# Patient Record
Sex: Female | Born: 1975
Health system: Southern US, Community
[De-identification: ages and names within clinical notes are randomized; demographics above are authoritative.]

## PROBLEM LIST (undated history)

## (undated) DIAGNOSIS — R011 Cardiac murmur, unspecified: Secondary | ICD-10-CM

## (undated) DIAGNOSIS — H269 Unspecified cataract: Secondary | ICD-10-CM

## (undated) DIAGNOSIS — J45909 Unspecified asthma, uncomplicated: Secondary | ICD-10-CM

## (undated) DIAGNOSIS — F419 Anxiety disorder, unspecified: Secondary | ICD-10-CM

## (undated) DIAGNOSIS — K635 Polyp of colon: Secondary | ICD-10-CM

## (undated) DIAGNOSIS — C50919 Malignant neoplasm of unspecified site of unspecified female breast: Secondary | ICD-10-CM

## (undated) DIAGNOSIS — G709 Myoneural disorder, unspecified: Secondary | ICD-10-CM

## (undated) DIAGNOSIS — Z5189 Encounter for other specified aftercare: Secondary | ICD-10-CM

## (undated) DIAGNOSIS — K259 Gastric ulcer, unspecified as acute or chronic, without hemorrhage or perforation: Secondary | ICD-10-CM

## (undated) DIAGNOSIS — J189 Pneumonia, unspecified organism: Secondary | ICD-10-CM

## (undated) DIAGNOSIS — F32A Depression, unspecified: Secondary | ICD-10-CM

## (undated) DIAGNOSIS — D649 Anemia, unspecified: Secondary | ICD-10-CM

## (undated) DIAGNOSIS — Z923 Personal history of irradiation: Secondary | ICD-10-CM

## (undated) DIAGNOSIS — B019 Varicella without complication: Secondary | ICD-10-CM

## (undated) DIAGNOSIS — T7840XA Allergy, unspecified, initial encounter: Secondary | ICD-10-CM

## (undated) DIAGNOSIS — I10 Essential (primary) hypertension: Secondary | ICD-10-CM

## (undated) HISTORY — DX: Allergy, unspecified, initial encounter: T78.40XA

## (undated) HISTORY — DX: Unspecified cataract: H26.9

## (undated) HISTORY — PX: COLONOSCOPY: SHX174

## (undated) HISTORY — DX: Polyp of colon: K63.5

## (undated) HISTORY — PX: BREAST LUMPECTOMY: SHX2

## (undated) HISTORY — DX: Depression, unspecified: F32.A

## (undated) HISTORY — DX: Varicella without complication: B01.9

## (undated) HISTORY — DX: Essential (primary) hypertension: I10

## (undated) HISTORY — DX: Anxiety disorder, unspecified: F41.9

## (undated) HISTORY — DX: Gastric ulcer, unspecified as acute or chronic, without hemorrhage or perforation: K25.9

## (undated) HISTORY — DX: Encounter for other specified aftercare: Z51.89

## (undated) HISTORY — DX: Myoneural disorder, unspecified: G70.9

## (undated) HISTORY — DX: Unspecified asthma, uncomplicated: J45.909

## (undated) HISTORY — PX: WISDOM TOOTH EXTRACTION: SHX21

## (undated) HISTORY — DX: Cardiac murmur, unspecified: R01.1

## (undated) HISTORY — DX: Malignant neoplasm of unspecified site of unspecified female breast: C50.919

## (undated) HISTORY — PX: TUBAL LIGATION: SHX77

## (undated) HISTORY — PX: CHOLECYSTECTOMY: SHX55

---

## 1996-03-18 HISTORY — PX: CHOLECYSTECTOMY, LAPAROSCOPIC: SHX56

## 2004-12-25 ENCOUNTER — Other Ambulatory Visit: Admission: RE | Admit: 2004-12-25 | Discharge: 2004-12-25 | Payer: Self-pay | Admitting: Family Medicine

## 2005-02-13 ENCOUNTER — Encounter: Admission: RE | Admit: 2005-02-13 | Discharge: 2005-02-13 | Payer: Self-pay | Admitting: Family Medicine

## 2006-03-19 ENCOUNTER — Ambulatory Visit: Payer: Self-pay | Admitting: Family Medicine

## 2006-05-13 ENCOUNTER — Ambulatory Visit: Payer: Self-pay | Admitting: Family Medicine

## 2006-07-23 ENCOUNTER — Ambulatory Visit: Payer: Self-pay | Admitting: Family Medicine

## 2006-10-21 ENCOUNTER — Ambulatory Visit: Payer: Self-pay | Admitting: Family Medicine

## 2007-03-14 ENCOUNTER — Emergency Department: Payer: Self-pay | Admitting: Emergency Medicine

## 2007-04-08 ENCOUNTER — Other Ambulatory Visit: Admission: RE | Admit: 2007-04-08 | Discharge: 2007-04-08 | Payer: Self-pay | Admitting: Family Medicine

## 2007-04-08 ENCOUNTER — Ambulatory Visit: Payer: Self-pay | Admitting: Family Medicine

## 2007-06-24 ENCOUNTER — Ambulatory Visit: Payer: Self-pay | Admitting: Surgery

## 2012-03-10 ENCOUNTER — Other Ambulatory Visit: Payer: Self-pay | Admitting: Family Medicine

## 2012-03-10 DIAGNOSIS — R7989 Other specified abnormal findings of blood chemistry: Secondary | ICD-10-CM

## 2012-03-15 ENCOUNTER — Other Ambulatory Visit: Payer: Self-pay | Admitting: Family Medicine

## 2012-03-15 ENCOUNTER — Other Ambulatory Visit: Payer: Self-pay

## 2012-03-15 ENCOUNTER — Ambulatory Visit
Admission: RE | Admit: 2012-03-15 | Discharge: 2012-03-15 | Disposition: A | Discharge status: Home / Self Care | Payer: Medicaid Other | Source: Ambulatory Visit | Attending: Family Medicine | Admitting: Family Medicine

## 2012-03-15 DIAGNOSIS — R7989 Other specified abnormal findings of blood chemistry: Secondary | ICD-10-CM

## 2017-11-05 DIAGNOSIS — Z6833 Body mass index (BMI) 33.0-33.9, adult: Secondary | ICD-10-CM | POA: Diagnosis not present

## 2017-11-05 DIAGNOSIS — J069 Acute upper respiratory infection, unspecified: Secondary | ICD-10-CM | POA: Diagnosis not present

## 2017-11-05 DIAGNOSIS — J019 Acute sinusitis, unspecified: Secondary | ICD-10-CM | POA: Diagnosis not present

## 2017-11-05 DIAGNOSIS — R030 Elevated blood-pressure reading, without diagnosis of hypertension: Secondary | ICD-10-CM | POA: Diagnosis not present

## 2017-12-28 ENCOUNTER — Other Ambulatory Visit: Payer: Self-pay | Admitting: Family Medicine

## 2017-12-28 DIAGNOSIS — I160 Hypertensive urgency: Secondary | ICD-10-CM | POA: Diagnosis not present

## 2017-12-28 DIAGNOSIS — Z1231 Encounter for screening mammogram for malignant neoplasm of breast: Secondary | ICD-10-CM | POA: Diagnosis not present

## 2017-12-28 DIAGNOSIS — Z1331 Encounter for screening for depression: Secondary | ICD-10-CM | POA: Diagnosis not present

## 2017-12-28 DIAGNOSIS — F17200 Nicotine dependence, unspecified, uncomplicated: Secondary | ICD-10-CM | POA: Diagnosis not present

## 2017-12-28 DIAGNOSIS — E7800 Pure hypercholesterolemia, unspecified: Secondary | ICD-10-CM | POA: Diagnosis not present

## 2018-01-19 DIAGNOSIS — M5416 Radiculopathy, lumbar region: Secondary | ICD-10-CM | POA: Diagnosis not present

## 2018-01-29 DIAGNOSIS — G5602 Carpal tunnel syndrome, left upper limb: Secondary | ICD-10-CM | POA: Diagnosis not present

## 2018-01-29 DIAGNOSIS — G5601 Carpal tunnel syndrome, right upper limb: Secondary | ICD-10-CM | POA: Diagnosis not present

## 2018-02-09 DIAGNOSIS — R200 Anesthesia of skin: Secondary | ICD-10-CM | POA: Diagnosis not present

## 2018-03-02 DIAGNOSIS — Z124 Encounter for screening for malignant neoplasm of cervix: Secondary | ICD-10-CM | POA: Diagnosis not present

## 2018-03-02 DIAGNOSIS — Z0001 Encounter for general adult medical examination with abnormal findings: Secondary | ICD-10-CM | POA: Diagnosis not present

## 2018-03-02 DIAGNOSIS — I10 Essential (primary) hypertension: Secondary | ICD-10-CM | POA: Diagnosis not present

## 2018-03-02 DIAGNOSIS — Z23 Encounter for immunization: Secondary | ICD-10-CM | POA: Diagnosis not present

## 2018-03-02 DIAGNOSIS — K760 Fatty (change of) liver, not elsewhere classified: Secondary | ICD-10-CM | POA: Diagnosis not present

## 2018-03-02 DIAGNOSIS — F17200 Nicotine dependence, unspecified, uncomplicated: Secondary | ICD-10-CM | POA: Diagnosis not present

## 2018-03-02 LAB — HM PAP SMEAR: HM Pap smear: NEGATIVE

## 2018-03-02 LAB — RESULTS CONSOLE HPV: CHL HPV: NEGATIVE

## 2018-03-04 DIAGNOSIS — G5601 Carpal tunnel syndrome, right upper limb: Secondary | ICD-10-CM | POA: Insufficient documentation

## 2018-03-16 ENCOUNTER — Encounter: Payer: Self-pay | Admitting: Radiology

## 2018-03-16 ENCOUNTER — Other Ambulatory Visit: Payer: Self-pay | Admitting: Family Medicine

## 2018-03-16 ENCOUNTER — Ambulatory Visit
Admission: RE | Admit: 2018-03-16 | Discharge: 2018-03-16 | Disposition: A | Discharge status: Home / Self Care | Payer: BLUE CROSS/BLUE SHIELD | Source: Ambulatory Visit | Attending: Family Medicine | Admitting: Family Medicine

## 2018-03-16 DIAGNOSIS — Z1231 Encounter for screening mammogram for malignant neoplasm of breast: Secondary | ICD-10-CM

## 2018-04-07 DIAGNOSIS — G8929 Other chronic pain: Secondary | ICD-10-CM | POA: Diagnosis not present

## 2018-04-07 DIAGNOSIS — M5416 Radiculopathy, lumbar region: Secondary | ICD-10-CM | POA: Diagnosis not present

## 2018-04-07 DIAGNOSIS — M5441 Lumbago with sciatica, right side: Secondary | ICD-10-CM | POA: Diagnosis not present

## 2018-04-08 ENCOUNTER — Other Ambulatory Visit: Payer: Self-pay | Admitting: Sports Medicine

## 2018-04-08 DIAGNOSIS — M5441 Lumbago with sciatica, right side: Principal | ICD-10-CM

## 2018-04-08 DIAGNOSIS — G8929 Other chronic pain: Secondary | ICD-10-CM

## 2018-04-08 DIAGNOSIS — M5416 Radiculopathy, lumbar region: Secondary | ICD-10-CM

## 2018-04-16 DIAGNOSIS — M5416 Radiculopathy, lumbar region: Secondary | ICD-10-CM | POA: Diagnosis not present

## 2018-04-16 DIAGNOSIS — M47817 Spondylosis without myelopathy or radiculopathy, lumbosacral region: Secondary | ICD-10-CM | POA: Diagnosis not present

## 2018-04-16 DIAGNOSIS — M7138 Other bursal cyst, other site: Secondary | ICD-10-CM | POA: Diagnosis not present

## 2018-04-16 DIAGNOSIS — M47816 Spondylosis without myelopathy or radiculopathy, lumbar region: Secondary | ICD-10-CM | POA: Diagnosis not present

## 2018-04-27 DIAGNOSIS — M47816 Spondylosis without myelopathy or radiculopathy, lumbar region: Secondary | ICD-10-CM | POA: Diagnosis not present

## 2018-04-27 DIAGNOSIS — M5136 Other intervertebral disc degeneration, lumbar region: Secondary | ICD-10-CM | POA: Diagnosis not present

## 2018-04-27 DIAGNOSIS — M5416 Radiculopathy, lumbar region: Secondary | ICD-10-CM | POA: Diagnosis not present

## 2018-05-03 DIAGNOSIS — M5416 Radiculopathy, lumbar region: Secondary | ICD-10-CM | POA: Diagnosis not present

## 2018-05-03 DIAGNOSIS — M5136 Other intervertebral disc degeneration, lumbar region: Secondary | ICD-10-CM | POA: Diagnosis not present

## 2018-06-08 DIAGNOSIS — M5136 Other intervertebral disc degeneration, lumbar region: Secondary | ICD-10-CM | POA: Diagnosis not present

## 2018-06-08 DIAGNOSIS — M5416 Radiculopathy, lumbar region: Secondary | ICD-10-CM | POA: Diagnosis not present

## 2018-06-16 DIAGNOSIS — R202 Paresthesia of skin: Secondary | ICD-10-CM | POA: Diagnosis not present

## 2018-06-16 DIAGNOSIS — M5136 Other intervertebral disc degeneration, lumbar region: Secondary | ICD-10-CM | POA: Diagnosis not present

## 2018-06-16 DIAGNOSIS — M5416 Radiculopathy, lumbar region: Secondary | ICD-10-CM | POA: Diagnosis not present

## 2018-06-16 DIAGNOSIS — M47816 Spondylosis without myelopathy or radiculopathy, lumbar region: Secondary | ICD-10-CM | POA: Diagnosis not present

## 2018-07-14 DIAGNOSIS — Z6828 Body mass index (BMI) 28.0-28.9, adult: Secondary | ICD-10-CM | POA: Diagnosis not present

## 2018-07-14 DIAGNOSIS — K760 Fatty (change of) liver, not elsewhere classified: Secondary | ICD-10-CM | POA: Diagnosis not present

## 2018-07-14 DIAGNOSIS — K529 Noninfective gastroenteritis and colitis, unspecified: Secondary | ICD-10-CM | POA: Diagnosis not present

## 2018-07-14 DIAGNOSIS — Z23 Encounter for immunization: Secondary | ICD-10-CM | POA: Diagnosis not present

## 2018-07-16 DIAGNOSIS — K529 Noninfective gastroenteritis and colitis, unspecified: Secondary | ICD-10-CM | POA: Diagnosis not present

## 2018-07-26 DIAGNOSIS — R1031 Right lower quadrant pain: Secondary | ICD-10-CM | POA: Diagnosis not present

## 2018-07-26 DIAGNOSIS — R194 Change in bowel habit: Secondary | ICD-10-CM | POA: Diagnosis not present

## 2018-07-26 DIAGNOSIS — R9389 Abnormal findings on diagnostic imaging of other specified body structures: Secondary | ICD-10-CM | POA: Diagnosis not present

## 2018-07-26 DIAGNOSIS — R5382 Chronic fatigue, unspecified: Secondary | ICD-10-CM | POA: Diagnosis not present

## 2018-07-26 DIAGNOSIS — R1032 Left lower quadrant pain: Secondary | ICD-10-CM | POA: Diagnosis not present

## 2018-08-04 DIAGNOSIS — R7989 Other specified abnormal findings of blood chemistry: Secondary | ICD-10-CM | POA: Diagnosis not present

## 2018-08-09 DIAGNOSIS — K640 First degree hemorrhoids: Secondary | ICD-10-CM | POA: Diagnosis not present

## 2018-08-09 DIAGNOSIS — R933 Abnormal findings on diagnostic imaging of other parts of digestive tract: Secondary | ICD-10-CM | POA: Diagnosis not present

## 2018-08-09 DIAGNOSIS — R197 Diarrhea, unspecified: Secondary | ICD-10-CM | POA: Diagnosis not present

## 2018-08-09 DIAGNOSIS — R194 Change in bowel habit: Secondary | ICD-10-CM | POA: Diagnosis not present

## 2018-08-09 DIAGNOSIS — D125 Benign neoplasm of sigmoid colon: Secondary | ICD-10-CM | POA: Diagnosis not present

## 2018-08-09 DIAGNOSIS — K5730 Diverticulosis of large intestine without perforation or abscess without bleeding: Secondary | ICD-10-CM | POA: Diagnosis not present

## 2018-08-09 DIAGNOSIS — D128 Benign neoplasm of rectum: Secondary | ICD-10-CM | POA: Diagnosis not present

## 2018-08-09 DIAGNOSIS — R1031 Right lower quadrant pain: Secondary | ICD-10-CM | POA: Diagnosis not present

## 2018-08-09 DIAGNOSIS — K621 Rectal polyp: Secondary | ICD-10-CM | POA: Diagnosis not present

## 2018-08-09 DIAGNOSIS — R1032 Left lower quadrant pain: Secondary | ICD-10-CM | POA: Diagnosis not present

## 2018-08-09 LAB — HM COLONOSCOPY

## 2018-08-27 DIAGNOSIS — M5136 Other intervertebral disc degeneration, lumbar region: Secondary | ICD-10-CM | POA: Diagnosis not present

## 2018-08-27 DIAGNOSIS — M5416 Radiculopathy, lumbar region: Secondary | ICD-10-CM | POA: Diagnosis not present

## 2018-08-27 DIAGNOSIS — M47816 Spondylosis without myelopathy or radiculopathy, lumbar region: Secondary | ICD-10-CM | POA: Diagnosis not present

## 2018-09-09 DIAGNOSIS — G5601 Carpal tunnel syndrome, right upper limb: Secondary | ICD-10-CM | POA: Diagnosis not present

## 2018-09-09 DIAGNOSIS — R29898 Other symptoms and signs involving the musculoskeletal system: Secondary | ICD-10-CM | POA: Diagnosis not present

## 2018-09-09 DIAGNOSIS — R200 Anesthesia of skin: Secondary | ICD-10-CM | POA: Diagnosis not present

## 2018-09-09 DIAGNOSIS — E559 Vitamin D deficiency, unspecified: Secondary | ICD-10-CM | POA: Diagnosis not present

## 2018-09-09 DIAGNOSIS — Z114 Encounter for screening for human immunodeficiency virus [HIV]: Secondary | ICD-10-CM | POA: Diagnosis not present

## 2018-12-30 ENCOUNTER — Encounter: Payer: Self-pay | Admitting: Emergency Medicine

## 2018-12-30 ENCOUNTER — Emergency Department: Payer: 59

## 2018-12-30 ENCOUNTER — Other Ambulatory Visit: Payer: Self-pay

## 2018-12-30 ENCOUNTER — Inpatient Hospital Stay
Admission: EM | Admit: 2018-12-30 | Discharge: 2019-01-01 | DRG: 378 | Disposition: A | Discharge status: Home / Self Care | Payer: 59 | Attending: Internal Medicine | Admitting: Internal Medicine

## 2018-12-30 DIAGNOSIS — G8929 Other chronic pain: Secondary | ICD-10-CM | POA: Diagnosis present

## 2018-12-30 DIAGNOSIS — K253 Acute gastric ulcer without hemorrhage or perforation: Secondary | ICD-10-CM

## 2018-12-30 DIAGNOSIS — K5713 Diverticulitis of small intestine without perforation or abscess with bleeding: Secondary | ICD-10-CM | POA: Diagnosis present

## 2018-12-30 DIAGNOSIS — R7982 Elevated C-reactive protein (CRP): Secondary | ICD-10-CM | POA: Diagnosis present

## 2018-12-30 DIAGNOSIS — K250 Acute gastric ulcer with hemorrhage: Principal | ICD-10-CM | POA: Diagnosis present

## 2018-12-30 DIAGNOSIS — K760 Fatty (change of) liver, not elsewhere classified: Secondary | ICD-10-CM | POA: Diagnosis present

## 2018-12-30 DIAGNOSIS — D539 Nutritional anemia, unspecified: Secondary | ICD-10-CM | POA: Diagnosis not present

## 2018-12-30 DIAGNOSIS — R112 Nausea with vomiting, unspecified: Secondary | ICD-10-CM | POA: Diagnosis not present

## 2018-12-30 DIAGNOSIS — F17210 Nicotine dependence, cigarettes, uncomplicated: Secondary | ICD-10-CM | POA: Diagnosis present

## 2018-12-30 DIAGNOSIS — Z79891 Long term (current) use of opiate analgesic: Secondary | ICD-10-CM | POA: Diagnosis not present

## 2018-12-30 DIAGNOSIS — K529 Noninfective gastroenteritis and colitis, unspecified: Secondary | ICD-10-CM | POA: Diagnosis present

## 2018-12-30 DIAGNOSIS — D696 Thrombocytopenia, unspecified: Secondary | ICD-10-CM | POA: Diagnosis present

## 2018-12-30 DIAGNOSIS — I10 Essential (primary) hypertension: Secondary | ICD-10-CM | POA: Diagnosis present

## 2018-12-30 DIAGNOSIS — R162 Hepatomegaly with splenomegaly, not elsewhere classified: Secondary | ICD-10-CM | POA: Diagnosis present

## 2018-12-30 DIAGNOSIS — K922 Gastrointestinal hemorrhage, unspecified: Secondary | ICD-10-CM | POA: Diagnosis present

## 2018-12-30 DIAGNOSIS — D62 Acute posthemorrhagic anemia: Secondary | ICD-10-CM | POA: Diagnosis present

## 2018-12-30 DIAGNOSIS — E871 Hypo-osmolality and hyponatremia: Secondary | ICD-10-CM | POA: Diagnosis present

## 2018-12-30 DIAGNOSIS — R42 Dizziness and giddiness: Secondary | ICD-10-CM | POA: Diagnosis present

## 2018-12-30 DIAGNOSIS — E860 Dehydration: Secondary | ICD-10-CM | POA: Diagnosis present

## 2018-12-30 DIAGNOSIS — E876 Hypokalemia: Secondary | ICD-10-CM | POA: Diagnosis present

## 2018-12-30 DIAGNOSIS — D649 Anemia, unspecified: Secondary | ICD-10-CM | POA: Diagnosis not present

## 2018-12-30 DIAGNOSIS — R1110 Vomiting, unspecified: Secondary | ICD-10-CM

## 2018-12-30 DIAGNOSIS — Z1159 Encounter for screening for other viral diseases: Secondary | ICD-10-CM | POA: Diagnosis not present

## 2018-12-30 DIAGNOSIS — E878 Other disorders of electrolyte and fluid balance, not elsewhere classified: Secondary | ICD-10-CM | POA: Diagnosis present

## 2018-12-30 DIAGNOSIS — Z803 Family history of malignant neoplasm of breast: Secondary | ICD-10-CM

## 2018-12-30 DIAGNOSIS — Z79899 Other long term (current) drug therapy: Secondary | ICD-10-CM

## 2018-12-30 DIAGNOSIS — E538 Deficiency of other specified B group vitamins: Secondary | ICD-10-CM | POA: Diagnosis present

## 2018-12-30 DIAGNOSIS — G43A1 Cyclical vomiting, intractable: Secondary | ICD-10-CM | POA: Diagnosis not present

## 2018-12-30 LAB — URINALYSIS, COMPLETE (UACMP) WITH MICROSCOPIC
Bilirubin Urine: NEGATIVE
Glucose, UA: NEGATIVE mg/dL
Ketones, ur: NEGATIVE mg/dL
Leukocytes,Ua: NEGATIVE
Nitrite: NEGATIVE
Protein, ur: NEGATIVE mg/dL
Specific Gravity, Urine: 1.005 (ref 1.005–1.030)
pH: 7 (ref 5.0–8.0)

## 2018-12-30 LAB — VITAMIN B12: Vitamin B-12: 3177 pg/mL — ABNORMAL HIGH (ref 180–914)

## 2018-12-30 LAB — COMPREHENSIVE METABOLIC PANEL
ALT: 18 U/L (ref 0–44)
AST: 67 U/L — ABNORMAL HIGH (ref 15–41)
Albumin: 3 g/dL — ABNORMAL LOW (ref 3.5–5.0)
Alkaline Phosphatase: 208 U/L — ABNORMAL HIGH (ref 38–126)
Anion gap: 18 — ABNORMAL HIGH (ref 5–15)
BUN: 11 mg/dL (ref 6–20)
CO2: 32 mmol/L (ref 22–32)
Calcium: 8.2 mg/dL — ABNORMAL LOW (ref 8.9–10.3)
Chloride: 81 mmol/L — ABNORMAL LOW (ref 98–111)
Creatinine, Ser: 0.76 mg/dL (ref 0.44–1.00)
GFR calc Af Amer: >60 mL/min (ref >60)
GFR calc non Af Amer: >60 mL/min (ref >60)
Glucose, Bld: 122 mg/dL — ABNORMAL HIGH (ref 70–99)
Potassium: 2.4 mmol/L — CL (ref 3.5–5.1)
Sodium: 131 mmol/L — ABNORMAL LOW (ref 135–145)
Total Bilirubin: 3.2 mg/dL — ABNORMAL HIGH (ref 0.3–1.2)
Total Protein: 7 g/dL (ref 6.5–8.1)

## 2018-12-30 LAB — IRON AND TIBC: Iron: 82 ug/dL (ref 28–170)

## 2018-12-30 LAB — FERRITIN
Ferritin: 1490 ng/mL — ABNORMAL HIGH (ref 11–307)
Ferritin: 1348 ng/mL — ABNORMAL HIGH (ref 11–307)

## 2018-12-30 LAB — LIPASE, BLOOD: Lipase: 23 U/L (ref 11–51)

## 2018-12-30 LAB — CBC
HCT: 19.7 % — ABNORMAL LOW (ref 36.0–46.0)
Hemoglobin: 7 g/dL — ABNORMAL LOW (ref 12.0–15.0)
MCH: 34.7 pg — ABNORMAL HIGH (ref 26.0–34.0)
MCHC: 35.5 g/dL (ref 30.0–36.0)
MCV: 97.5 fL (ref 80.0–100.0)
Platelets: 132 10*3/uL — ABNORMAL LOW (ref 150–400)
RBC: 2.02 MIL/uL — ABNORMAL LOW (ref 3.87–5.11)
RDW: 13.9 % (ref 11.5–15.5)
WBC: 5.5 10*3/uL (ref 4.0–10.5)
nRBC: 0 % (ref 0.0–0.2)

## 2018-12-30 LAB — C-REACTIVE PROTEIN: CRP: 4.3 mg/dL — ABNORMAL HIGH (ref <1.0)

## 2018-12-30 LAB — LACTIC ACID, PLASMA
Lactic Acid, Venous: 2.2 mmol/L (ref 0.5–1.9)
Lactic Acid, Venous: 2 mmol/L (ref 0.5–1.9)

## 2018-12-30 LAB — FOLATE: Folate: 5.1 ng/mL — ABNORMAL LOW (ref >5.9)

## 2018-12-30 LAB — RETICULOCYTES
Immature Retic Fract: 8.3 % (ref 2.3–15.9)
RBC.: 1.57 MIL/uL — ABNORMAL LOW (ref 3.87–5.11)
Retic Count, Absolute: 9.3 10*3/uL — ABNORMAL LOW (ref 19.0–186.0)
Retic Ct Pct: 0.6 % (ref 0.4–3.1)

## 2018-12-30 LAB — TROPONIN I: Troponin I: <0.03 ng/mL (ref <0.03)

## 2018-12-30 LAB — POCT PREGNANCY, URINE: Preg Test, Ur: NEGATIVE

## 2018-12-30 LAB — TSH: TSH: 4.527 u[IU]/mL — ABNORMAL HIGH (ref 0.350–4.500)

## 2018-12-30 LAB — MAGNESIUM: Magnesium: 1.3 mg/dL — ABNORMAL LOW (ref 1.7–2.4)

## 2018-12-30 LAB — PROCALCITONIN: Procalcitonin: 0.63 ng/mL

## 2018-12-30 LAB — ABO/RH: ABO/RH(D): A POS

## 2018-12-30 LAB — SARS CORONAVIRUS 2 BY RT PCR (HOSPITAL ORDER, PERFORMED IN ~~LOC~~ HOSPITAL LAB): SARS Coronavirus 2: NEGATIVE

## 2018-12-30 MED ORDER — HYDROCODONE-ACETAMINOPHEN 5-325 MG PO TABS
1.0000 | ORAL_TABLET | ORAL | Status: DC | PRN
  Administered 2018-12-30: 22:00:00 2 via ORAL
  Administered 2018-12-31 – 2019-01-01 (×4): 1 via ORAL
  Filled 2018-12-30 (×2): qty 2
  Filled 2018-12-30 (×3): qty 1

## 2018-12-30 MED ORDER — SODIUM CHLORIDE 0.9 % IV SOLN
80.0000 mg | Freq: Once | INTRAVENOUS | Status: AC
  Administered 2018-12-30: 22:00:00 80 mg via INTRAVENOUS
  Filled 2018-12-30: qty 80

## 2018-12-30 MED ORDER — SODIUM CHLORIDE 0.9 % IV BOLUS
1000.0000 mL | Freq: Once | INTRAVENOUS | Status: AC
  Administered 2018-12-30: 1000 mL via INTRAVENOUS

## 2018-12-30 MED ORDER — ONDANSETRON HCL 4 MG/2ML IJ SOLN: 4.0000 mg | Freq: Four times a day (QID) | INTRAMUSCULAR | Status: DC | PRN

## 2018-12-30 MED ORDER — ONDANSETRON HCL 4 MG PO TABS: 4.0000 mg | ORAL_TABLET | Freq: Four times a day (QID) | ORAL | Status: DC | PRN

## 2018-12-30 MED ORDER — IOHEXOL 300 MG/ML  SOLN
100.0000 mL | Freq: Once | INTRAMUSCULAR | Status: AC | PRN
  Administered 2018-12-30: 100 mL via INTRAVENOUS

## 2018-12-30 MED ORDER — ACETAMINOPHEN 325 MG PO TABS
650.0000 mg | ORAL_TABLET | Freq: Four times a day (QID) | ORAL | Status: DC | PRN
  Administered 2018-12-31: 20:00:00 650 mg via ORAL
  Filled 2018-12-30 (×2): qty 2

## 2018-12-30 MED ORDER — SODIUM CHLORIDE 0.9% FLUSH: 3.0000 mL | Freq: Once | INTRAVENOUS | Status: DC

## 2018-12-30 MED ORDER — MORPHINE SULFATE (PF) 2 MG/ML IV SOLN
2.0000 mg | INTRAVENOUS | Status: DC | PRN
  Administered 2018-12-30: 18:00:00 2 mg via INTRAVENOUS
  Filled 2018-12-30: qty 1

## 2018-12-30 MED ORDER — PANTOPRAZOLE SODIUM 40 MG IV SOLR: 40.0000 mg | Freq: Two times a day (BID) | INTRAVENOUS | Status: DC

## 2018-12-30 MED ORDER — TRAZODONE HCL 50 MG PO TABS: 50.0000 mg | ORAL_TABLET | Freq: Every evening | ORAL | Status: DC | PRN

## 2018-12-30 MED ORDER — VITAMIN B-12 1000 MCG PO TABS
1000.0000 ug | ORAL_TABLET | Freq: Every day | ORAL | Status: DC
  Filled 2018-12-30 (×2): qty 1

## 2018-12-30 MED ORDER — DIPHENHYDRAMINE HCL 25 MG PO CAPS
25.0000 mg | ORAL_CAPSULE | Freq: Once | ORAL | Status: AC
  Administered 2018-12-30: 18:00:00 25 mg via ORAL
  Filled 2018-12-30: qty 1

## 2018-12-30 MED ORDER — ACETAMINOPHEN 325 MG PO TABS
650.0000 mg | ORAL_TABLET | Freq: Once | ORAL | Status: AC
  Administered 2018-12-30: 650 mg via ORAL
  Filled 2018-12-30: qty 2

## 2018-12-30 MED ORDER — ALBUTEROL SULFATE (2.5 MG/3ML) 0.083% IN NEBU: 3.0000 mL | INHALATION_SOLUTION | RESPIRATORY_TRACT | Status: DC | PRN

## 2018-12-30 MED ORDER — POTASSIUM CHLORIDE 10 MEQ/100ML IV SOLN
10.0000 meq | INTRAVENOUS | Status: DC
  Administered 2018-12-30: 21:00:00 10 meq via INTRAVENOUS
  Filled 2018-12-30: qty 100

## 2018-12-30 MED ORDER — SODIUM CHLORIDE 0.9 % IV SOLN: 10.0000 mL/h | Freq: Once | INTRAVENOUS | Status: DC

## 2018-12-30 MED ORDER — FUROSEMIDE 10 MG/ML IJ SOLN
20.0000 mg | Freq: Once | INTRAMUSCULAR | Status: AC
  Administered 2018-12-30: 20 mg via INTRAVENOUS
  Filled 2018-12-30: qty 2

## 2018-12-30 MED ORDER — POTASSIUM CHLORIDE 10 MEQ/100ML IV SOLN
10.0000 meq | Freq: Once | INTRAVENOUS | Status: AC
  Administered 2018-12-30: 10 meq via INTRAVENOUS
  Filled 2018-12-30: qty 100

## 2018-12-30 MED ORDER — SODIUM CHLORIDE 0.9% IV SOLUTION
Freq: Once | INTRAVENOUS | Status: DC
  Filled 2018-12-30: qty 250

## 2018-12-30 MED ORDER — LISINOPRIL 5 MG PO TABS: 5.0000 mg | ORAL_TABLET | Freq: Every day | ORAL | Status: DC

## 2018-12-30 MED ORDER — IOHEXOL 240 MG/ML SOLN
50.0000 mL | Freq: Once | INTRAMUSCULAR | Status: AC
  Administered 2018-12-30: 50 mL via ORAL

## 2018-12-30 MED ORDER — POTASSIUM CHLORIDE CRYS ER 20 MEQ PO TBCR
30.0000 meq | EXTENDED_RELEASE_TABLET | ORAL | Status: AC | PRN
  Administered 2018-12-30 – 2018-12-31 (×2): 30 meq via ORAL
  Filled 2018-12-30 (×2): qty 1

## 2018-12-30 MED ORDER — ONDANSETRON HCL 4 MG/2ML IJ SOLN
4.0000 mg | Freq: Once | INTRAMUSCULAR | Status: AC
  Administered 2018-12-30: 4 mg via INTRAVENOUS
  Filled 2018-12-30: qty 2

## 2018-12-30 MED ORDER — SODIUM CHLORIDE 0.9 % IV SOLN
8.0000 mg/h | INTRAVENOUS | Status: DC
  Administered 2018-12-30: 8 mg/h via INTRAVENOUS
  Filled 2018-12-30: qty 80

## 2018-12-30 MED ORDER — ACETAMINOPHEN 650 MG RE SUPP: 650.0000 mg | Freq: Four times a day (QID) | RECTAL | Status: DC | PRN

## 2018-12-30 MED ORDER — POTASSIUM CHLORIDE IN NACL 40-0.9 MEQ/L-% IV SOLN
INTRAVENOUS | Status: DC
  Administered 2018-12-31 – 2019-01-01 (×3): 100 mL/h via INTRAVENOUS
  Filled 2018-12-30 (×4): qty 1000

## 2018-12-30 NOTE — ED Notes (Signed)
Pt aware she needs to provide urine sample

## 2018-12-30 NOTE — ED Notes (Signed)
Dr. Cinda Quest notified of potassium 2.4, no new orders at this time

## 2018-12-30 NOTE — Progress Notes (Signed)
CRITICAL VALUE STICKER  CRITICAL VALUE: lactic acid 2.2  RECEIVER (on-site recipient of call): Arroyo NOTIFIED:   MESSENGER (representative from lab):  MD NOTIFIED: Salary  TIME OF NOTIFICATION: 6:69 PM  RESPONSE: waiting

## 2018-12-30 NOTE — ED Notes (Signed)
Pt brought to room 7 by wheelchair. Pt speaking to this RN with NAD, A&Ox4

## 2018-12-30 NOTE — ED Notes (Signed)
ED TO INPATIENT HANDOFF REPORT  ED Nurse Name and Phone #: Janett Billow [phone]   S Name/Age/Gender Morgan Sanders 43 y.o. female Room/Bed: ED07A/ED07A  Code Status   Code Status: Not on file  Home/SNF/Other Home Patient oriented to: self, place, time and situation Is this baseline? Yes   Triage Complete: Triage complete  Chief Complaint vomiting  Triage Note Pt here with c/o vomiting every day for a week and a half. States she is unable to keep anything down. Denies any other symptoms, NAD.   Pt states she passed out Monday from not eating, denies hitting her head, unwitnessed fall.    Allergies No Known Allergies  Level of Care/Admitting Diagnosis ED Disposition    ED Disposition Condition [facility] Area: [place] [[id number]]  Level of Care: Med-Surg [16]  Covid Evaluation: N/A  Diagnosis: GIB (gastrointestinal bleeding) [578469]  Admitting Physician: Gorden Harms [[id number]]  Attending Physician: Gorden Harms [[id number]]  Estimated length of stay: past midnight tomorrow  Certification:: I certify this patient will need inpatient services for at least 2 midnights  PT Class (Do Not Modify): Inpatient [101]  PT Acc Code (Do Not Modify): Private [1]       B Medical/Surgery History History reviewed. No pertinent past medical history. History reviewed. No pertinent surgical history.   A IV Location/Drains/Wounds Patient Lines/Drains/Airways Status   Active Line/Drains/Airways    Name:   Placement date:   Placement time:   Site:   Days:   Peripheral IV 12/30/18 Right Antecubital   12/30/18    1211    Antecubital   less than 1   Peripheral IV 12/30/18 Left Forearm   12/30/18    1646    Forearm   less than 1          Intake/Output Last 24 hours No intake or output data in the 24 hours ending 12/30/18 1709  Labs/Imaging Results for orders placed or performed during the hospital encounter of 12/30/18 (from the past 48  hour(s))  Lipase, blood     Status: None   Collection Time: 12/30/18 12:04 PM  Result Value Ref Range   Lipase 23 11 - 51 U/L    Comment: Performed at [facility] [address], [address] [place], [place] [address]  Comprehensive metabolic panel     Status: Abnormal   Collection Time: 12/30/18 12:04 PM  Result Value Ref Range   Sodium 131 (L) 135 - 145 mmol/L   Potassium 2.4 (LL) 3.5 - 5.1 mmol/L    Comment: CRITICAL RESULT CALLED TO, READ BACK BY AND VERIFIED WITH Adarrius Graeff COLTRANE AT 1240 ON 12/30/18 KLM   Chloride 81 (L) 98 - 111 mmol/L   CO2 32 22 - 32 mmol/L   Glucose, Bld 122 (H) 70 - 99 mg/dL   BUN 11 6 - 20 mg/dL   Creatinine, Ser 0.76 0.44 - 1.00 mg/dL   Calcium 8.2 (L) 8.9 - 10.3 mg/dL   Total Protein 7.0 6.5 - 8.1 g/dL   Albumin 3.0 (L) 3.5 - 5.0 g/dL   AST 67 (H) 15 - 41 U/L   ALT 18 0 - 44 U/L   Alkaline Phosphatase 208 (H) 38 - 126 U/L   Total Bilirubin 3.2 (H) 0.3 - 1.2 mg/dL   GFR calc non Af Amer >60 >60 mL/min   GFR calc Af Amer >60 >60 mL/min   Anion gap 18 (H) 5 - 15    Comment: Performed at  [facility], [address] [place], [place] [address]  CBC     Status: Abnormal   Collection Time: 12/30/18 12:04 PM  Result Value Ref Range   WBC 5.5 4.0 - 10.5 K/uL   RBC 2.02 (L) 3.87 - 5.11 MIL/uL   Hemoglobin 7.0 (L) 12.0 - 15.0 g/dL   HCT 19.7 (L) 36.0 - 46.0 %   MCV 97.5 80.0 - 100.0 fL   MCH 34.7 (H) 26.0 - 34.0 pg   MCHC 35.5 30.0 - 36.0 g/dL   RDW 13.9 11.5 - 15.5 %   Platelets 132 (L) 150 - 400 K/uL   nRBC 0.0 0.0 - 0.2 %    Comment: Performed at [facility], [address] [place], [place] [address]  Ferritin (Iron Binding Protein)     Status: Abnormal   Collection Time: 12/30/18 12:04 PM  Result Value Ref Range   Ferritin 1,490 (H) 11 - 307 ng/mL    Comment: Performed at [facility], [address] [place], [place] [address]  Troponin I - Add-On to previous collection     Status: None   Collection Time: 12/30/18  12:04 PM  Result Value Ref Range   Troponin I <0.03 <0.03 ng/mL    Comment: Performed at [facility], [address] [place], [place] [address]  Urinalysis, Complete w Microscopic     Status: Abnormal   Collection Time: 12/30/18  3:12 PM  Result Value Ref Range   Color, Urine YELLOW (A) YELLOW   APPearance HAZY (A) CLEAR   Specific Gravity, Urine 1.005 1.005 - 1.030   pH 7.0 5.0 - 8.0   Glucose, UA NEGATIVE NEGATIVE mg/dL   Hgb urine dipstick MODERATE (A) NEGATIVE   Bilirubin Urine NEGATIVE NEGATIVE   Ketones, ur NEGATIVE NEGATIVE mg/dL   Protein, ur NEGATIVE NEGATIVE mg/dL   Nitrite NEGATIVE NEGATIVE   Leukocytes,Ua NEGATIVE NEGATIVE   RBC / HPF 0-5 0 - 5 RBC/hpf   WBC, UA 0-5 0 - 5 WBC/hpf   Bacteria, UA FEW (A) NONE SEEN   Squamous Epithelial / LPF 0-5 0 - 5   Mucus PRESENT     Comment: Performed at [facility], [address] [place],  [address]  Pregnancy, urine POC     Status: None   Collection Time: 12/30/18  3:14 PM  Result Value Ref Range   Preg Test, Ur NEGATIVE NEGATIVE    Comment:        THE SENSITIVITY OF THIS METHODOLOGY IS >24 mIU/mL    Ct Head Wo Contrast  Result Date: 12/30/2018 CLINICAL DATA:  Syncope. Patient reportedly hit head against solid object at the time of syncope. Vomiting. EXAM: CT HEAD WITHOUT CONTRAST TECHNIQUE: Contiguous axial images were obtained from the base of the skull through the vertex without intravenous contrast. COMPARISON:  None. FINDINGS: Brain: The ventricles are normal in size and configuration. There is no intracranial mass, hemorrhage, extra-axial fluid collection, or midline shift. The brain parenchyma appears unremarkable. No evident acute infarct. Vascular: No hyperdense vessel. There is no appreciable vascular calcification. Skull: The bony calvarium appears intact. Sinuses/Orbits: Visualized paranasal sinuses are clear. Visualized orbits appear symmetric bilaterally. Other: Mastoid air cells are  clear. There is an apparent skin nevus along the left frontal region. IMPRESSION: Study within normal limits. Electronically Signed   By: Lowella Grip III M.D.   On: 12/30/2018 15:42   Ct Abdomen Pelvis W Contrast  Result Date: 12/30/2018 CLINICAL DATA:  Vomiting EXAM:  CT ABDOMEN AND PELVIS WITH CONTRAST TECHNIQUE: Multidetector CT imaging of the abdomen and pelvis was performed using the standard protocol following bolus administration of intravenous contrast. CONTRAST:  117mL OMNIPAQUE IOHEXOL 300 MG/ML SOLN, additional oral enteric contrast COMPARISON:  None. FINDINGS: Lower chest: No acute abnormality. Hepatobiliary: Hepatomegaly and hepatic steatosis, maximum coronal span 21 cm. Status post cholecystectomy. No gallbladder wall thickening, or biliary dilatation. Pancreas: Unremarkable. No pancreatic ductal dilatation or surrounding inflammatory changes. Spleen: Splenomegaly, maximum coronal span 15.1 cm. Adrenals/Urinary Tract: Adrenal glands are unremarkable. Kidneys are normal, without renal calculi, focal lesion, or hydronephrosis. Bladder is unremarkable. Stomach/Bowel: Stomach is within normal limits. Appendix appears normal. There is extensive fatty mural stratification of the terminal ileum, cecum, and rectum, with mild, well thickening, fat stranding, and vascular combing about the remaining colon (e.g. series 2, image 46, 70). Vascular/Lymphatic: Mixed calcific atherosclerosis. No enlarged abdominal or pelvic lymph nodes. Reproductive: No mass or other abnormality. Other: No abdominal wall hernia or abnormality. Small volume of fluid in the low abdomen. Musculoskeletal: No acute or significant osseous findings. IMPRESSION: 1. There is extensive fatty mural stratification of the terminal ileum, cecum, and rectum, with mild, well thickening, fat stranding, and vascular combing about the remaining colon (e.g. series 2, image 46, 70). Findings are consistent with nonspecific infectious,  inflammatory, or ischemic colitis, and particularly chronic or recurrent etiology such as inflammatory bowel disease. 2.  Hepatomegaly and hepatic steatosis. 3.  Splenomegaly. 4.  Small volume nonspecific fluid in the low abdomen. Electronically Signed   By: Eddie Candle M.D.   On: 12/30/2018 15:53    Pending Labs Unresulted Labs (From admission, onward)    Start     Ordered   12/30/18 1630  Prepare RBC  (Adult Blood Administration - Red Blood Cells)  Once,   R    Question Answer Comment  # of Units 2 units   Transfusion Indications Symptomatic Anemia   If emergent release call blood bank [facility] [phone]      12/30/18 1629   12/30/18 1630  Type and screen [place]  Once,   STAT    Comments:  [place]    12/30/18 1629   12/30/18 1619  Prepare RBC  (Adult Blood Administration - PRBC)  Once,   R    Question Answer Comment  # of Units 2 units   Transfusion Indications Actively Bleeding / GI Bleed   Number of Units to Keep Ahead 2 units ahead   If emergent release call blood bank Not emergent release      12/30/18 1622   12/30/18 1618  SARS Coronavirus 2 [facility] order, Performed in [place] hospital lab)  (Novel Coronavirus, [facility] Order))  ONCE - STAT,   STAT     12/30/18 1617   Signed and Held  Basic metabolic panel  Tomorrow morning,   R     Signed and Held   Signed and Held  CBC  Tomorrow morning,   R     Signed and Held   Signed and Held  Magnesium  Once,   R     Signed and Held   Signed and Held  Iron and TIBC  Once,   R     Signed and Held   Signed and Held  Vitamin B12  Once,   R     Signed and Held   Signed and Held  Folate  Once,   R  Signed and Held   Signed and Held  Reticulocytes  Once,   R     Signed and Held   Signed and Held  TSH  Once,   R     Signed and Held   Signed and Held  Ferritin  Once,   R     Signed and Held   Signed and Held  Lactic acid, plasma  STAT Now then every 3  hours,   R     Signed and Held   Signed and Held  C-reactive protein  Once,   R     Signed and Held          Vitals/Pain Today's Vitals   12/30/18 1500 12/30/18 1545 12/30/18 1600 12/30/18 1615  BP: 107/75     Pulse: 94 (!) 103 (!) 104   Resp: 15 13 (!) 24 15  Temp:      TempSrc:      SpO2: 100% 99% 97%   Weight:      Height:      PainSc:        Isolation Precautions Droplet and Contact precautions  Medications Medications  sodium chloride flush (NS) 0.9 % injection 3 mL (3 mLs Intravenous Not Given 12/30/18 1400)  0.9 %  sodium chloride infusion (has no administration in time range)  potassium chloride 10 mEq in 100 mL IVPB (10 mEq Intravenous New Bag/Given 12/30/18 1701)  0.9 %  sodium chloride infusion (Manually program via Guardrails IV Fluids) (has no administration in time range)  acetaminophen (TYLENOL) tablet 650 mg (has no administration in time range)  diphenhydrAMINE (BENADRYL) capsule 25 mg (has no administration in time range)  furosemide (LASIX) injection 20 mg (has no administration in time range)  iohexol (OMNIPAQUE) 240 MG/ML injection 50 mL (50 mLs Oral Contrast Given 12/30/18 1359)  ondansetron (ZOFRAN) injection 4 mg (4 mg Intravenous Given 12/30/18 1424)  sodium chloride 0.9 % bolus 1,000 mL (0 mLs Intravenous Stopped 12/30/18 1522)  iohexol (OMNIPAQUE) 300 MG/ML solution 100 mL (100 mLs Intravenous Contrast Given 12/30/18 1525)  sodium chloride 0.9 % bolus 1,000 mL (1,000 mLs Intravenous New Bag/Given 12/30/18 1625)    Mobility walks High fall risk   Focused Assessments 1   R Recommendations: See Admitting Provider Note  Report given to:   Additional Notes:

## 2018-12-30 NOTE — ED Provider Notes (Signed)
[facility] Emergency Department Provider Note   ____________________________________________   First MD Initiated Contact with Patient 12/30/18 1200     (approximate)  I have reviewed the triage vital signs and the nursing notes.   HISTORY  Chief Complaint Emesis and Loss of Consciousness    HPI Morgan Sanders is a 43 y.o. female who reports she fell a week and a half ago.  Uncertain if she hit her head or not she does not think so.  Since then however she has been having almost intractable vomiting.  Cannot keep anything down.  She is complaining of increasing right upper quadrant pain.  She has had right upper quadrant pain in the past but not this bad.  She also has a history of elevated LFTs in the past.  She is not short of breath.  She is not running a fever.  She is not coughing.  She is not having any diarrhea.         History reviewed. No pertinent past medical history.  There are no active problems to display for this patient.   History reviewed. No pertinent surgical history.  Prior to Admission medications   Not on File    Allergies Patient has no known allergies.  Family History  Problem Relation Age of Onset   Breast cancer Maternal Grandmother 78    Social History Social History   Tobacco Use   Smoking status: Current Every Day Smoker    Packs/day: 1.00    Types: Cigarettes   Smokeless tobacco: Never Used  Substance Use Topics   Alcohol use: Not Currently   Drug use: Not on file    Review of Systems  Constitutional: No fever/chills Eyes: No visual changes. ENT: No sore throat. Cardiovascular: Denies chest pain. Respiratory: Denies shortness of breath. Gastrointestinal:  abdominal pain.   nausea, vomiting.  No diarrhea.  No constipation. Genitourinary: Negative for dysuria. Musculoskeletal: Negative for back pain. Skin: Negative for rash. Neurological: Negative for headaches, focal weakness    ____________________________________________   PHYSICAL EXAM:  VITAL SIGNS: ED Triage Vitals  Enc Vitals Group     BP 12/30/18 1151 102/64     Pulse Rate 12/30/18 1151 100     Resp 12/30/18 1151 16     Temp 12/30/18 1151 98.5 F (36.9 C)     Temp Source 12/30/18 1151 Oral     SpO2 12/30/18 1151 100 %     Weight 12/30/18 1152 147 lb (66.7 kg)     Height 12/30/18 1152 5\' 5"  (1.651 m)     Head Circumference --      Peak Flow --      Pain Score 12/30/18 1152 0     Pain Loc --      Pain Edu? --      Excl. in [place]? --     Constitutional: Alert and oriented. Well appearing and in no acute distress. Eyes: Conjunctivae are normal. Head: Atraumatic. Nose: No congestion/rhinnorhea. Mouth/Throat: Mucous membranes are moist.  Oropharynx non-erythematous. Neck: No stridor.   Cardiovascular: Normal rate, regular rhythm. Grossly normal heart sounds.  Good peripheral circulation. Respiratory: Normal respiratory effort.  No retractions. Lungs CTAB. Gastrointestinal: Soft  tender to palpation in the right upper quadrant no distention. No abdominal bruits. No CVA tenderness. Musculoskeletal: No lower extremity tenderness nor edema. Neurologic:  Normal speech and language. No gross focal neurologic deficits are appreciated. Skin:  Skin is warm, dry and intact. No rash noted. Rectal: Hemoccult  positive, no masses ____________________________________________   LABS (all labs ordered are listed, but only abnormal results are displayed)  Labs Reviewed  COMPREHENSIVE METABOLIC PANEL - Abnormal; Notable for the following components:      Result Value   Sodium 131 (*)    Potassium 2.4 (*)    Chloride 81 (*)    Glucose, Bld 122 (*)    Calcium 8.2 (*)    Albumin 3.0 (*)    AST 67 (*)    Alkaline Phosphatase 208 (*)    Total Bilirubin 3.2 (*)    Anion gap 18 (*)    All other components within normal limits  CBC - Abnormal; Notable for the following components:   RBC 2.02 (*)     Hemoglobin 7.0 (*)    HCT 19.7 (*)    MCH 34.7 (*)    Platelets 132 (*)    All other components within normal limits  URINALYSIS, COMPLETE (UACMP) WITH MICROSCOPIC - Abnormal; Notable for the following components:   Color, Urine YELLOW (*)    APPearance HAZY (*)    Hgb urine dipstick MODERATE (*)    Bacteria, UA FEW (*)    All other components within normal limits  FERRITIN - Abnormal; Notable for the following components:   Ferritin 1,490 (*)    All other components within normal limits  LIPASE, BLOOD  TROPONIN I  POC URINE PREG, ED  POCT PREGNANCY, URINE   ____________________________________________  EKG  ____________________________________________  RADIOLOGY  ED MD interpretation: CT of head and abdomen read by radiology reviewed by me head CT is negative essentially CT of the abdomen shows colitis possibly inflammatory bowel disease.  Official radiology report(s): Ct Head Wo Contrast  Result Date: 12/30/2018 CLINICAL DATA:  Syncope. Patient reportedly hit head against solid object at the time of syncope. Vomiting. EXAM: CT HEAD WITHOUT CONTRAST TECHNIQUE: Contiguous axial images were obtained from the base of the skull through the vertex without intravenous contrast. COMPARISON:  None. FINDINGS: Brain: The ventricles are normal in size and configuration. There is no intracranial mass, hemorrhage, extra-axial fluid collection, or midline shift. The brain parenchyma appears unremarkable. No evident acute infarct. Vascular: No hyperdense vessel. There is no appreciable vascular calcification. Skull: The bony calvarium appears intact. Sinuses/Orbits: Visualized paranasal sinuses are clear. Visualized orbits appear symmetric bilaterally. Other: Mastoid air cells are clear. There is an apparent skin nevus along the left frontal region. IMPRESSION: Study within normal limits. Electronically Signed   By: Lowella Grip III M.D.   On: 12/30/2018 15:42   Ct Abdomen Pelvis W  Contrast  Result Date: 12/30/2018 CLINICAL DATA:  Vomiting EXAM: CT ABDOMEN AND PELVIS WITH CONTRAST TECHNIQUE: Multidetector CT imaging of the abdomen and pelvis was performed using the standard protocol following bolus administration of intravenous contrast. CONTRAST:  118mL OMNIPAQUE IOHEXOL 300 MG/ML SOLN, additional oral enteric contrast COMPARISON:  None. FINDINGS: Lower chest: No acute abnormality. Hepatobiliary: Hepatomegaly and hepatic steatosis, maximum coronal span 21 cm. Status post cholecystectomy. No gallbladder wall thickening, or biliary dilatation. Pancreas: Unremarkable. No pancreatic ductal dilatation or surrounding inflammatory changes. Spleen: Splenomegaly, maximum coronal span 15.1 cm. Adrenals/Urinary Tract: Adrenal glands are unremarkable. Kidneys are normal, without renal calculi, focal lesion, or hydronephrosis. Bladder is unremarkable. Stomach/Bowel: Stomach is within normal limits. Appendix appears normal. There is extensive fatty mural stratification of the terminal ileum, cecum, and rectum, with mild, well thickening, fat stranding, and vascular combing about the remaining colon (e.g. series 2, image 46, 70). Vascular/Lymphatic: Mixed calcific  atherosclerosis. No enlarged abdominal or pelvic lymph nodes. Reproductive: No mass or other abnormality. Other: No abdominal wall hernia or abnormality. Small volume of fluid in the low abdomen. Musculoskeletal: No acute or significant osseous findings. IMPRESSION: 1. There is extensive fatty mural stratification of the terminal ileum, cecum, and rectum, with mild, well thickening, fat stranding, and vascular combing about the remaining colon (e.g. series 2, image 46, 70). Findings are consistent with nonspecific infectious, inflammatory, or ischemic colitis, and particularly chronic or recurrent etiology such as inflammatory bowel disease. 2.  Hepatomegaly and hepatic steatosis. 3.  Splenomegaly. 4.  Small volume nonspecific fluid in the low  abdomen. Electronically Signed   By: Eddie Candle M.D.   On: 12/30/2018 15:53    ____________________________________________   PROCEDURES  Procedure(s) performed (including Critical Care):  Procedures   ____________________________________________   INITIAL IMPRESSION / ASSESSMENT AND PLAN / ED COURSE   Patient with almost intractable vomiting.  She was able to tolerate a very small amount of p.o. contrast.  However she is also dropped her hemoglobin hematocrit from normal down and 7 and 19.  She is Hemoccult positive from below.  She also is hyponatremic and hypokalemic.  We will have to give her some IV potassium I believe and monitor her carefully for her anemia    ----------------------------------------- 4:22 PM on 12/30/2018 -----------------------------------------  While I was obtaining consent for blood transfusion patient's blood pressure began dropping.  Is now in the 80s and almost immediately after drop down into the high 70s.  We will give her a bolus of fluid and begin transfusing her ASAP.         ____________________________________________   FINAL CLINICAL IMPRESSION(S) / ED DIAGNOSES  Final diagnoses:  Dehydration  Gastrointestinal hemorrhage, unspecified gastrointestinal hemorrhage type  Anemia, unspecified type  Intractable vomiting with nausea, unspecified vomiting type     ED Discharge Orders    None       Note:  This document was prepared using Dragon voice recognition software and may include unintentional dictation errors.    Nena Polio, MD 12/30/18 1622

## 2018-12-30 NOTE — H&P (Signed)
[facility] NAME: Morgan Sanders    MR#:  [id number]  DATE OF BIRTH:  26-Jan-1976  DATE OF ADMISSION:  12/30/2018  PRIMARY CARE PHYSICIAN: Isaias Sakai, DO   REQUESTING/REFERRING PHYSICIAN:   CHIEF COMPLAINT:   Chief Complaint  Patient presents with  . Emesis  . Loss of Consciousness    HISTORY OF PRESENT ILLNESS: Morgan Sanders  is a 43 y.o. female with a known history of chronic abdominal pain-followed by Dr. Alice Reichert, had colonoscopy done in December of last year noted for polyps/diverticulosis, vitamin D deficiency, vitamin B12 deficiency, presents emergency room with 10-day history of intractable nausea/emesis, inability to tolerate p.o. intake, lightheadedness, dizziness, generalized weakness, fatigue, had sustained a fall as well without hitting her head, and emergency room patient was found to be tachycardic, hemoglobin 7, sodium 131, potassium 2.4, CT abdomen noted for inflammatory bowel disease versus infection/hepatomegaly/hepatic steatosis/splenomegaly, guaiac positive, patient valuated in the emergency room, patient in no apparent distress, resting comfortably in bed, patient states that her abdominal pain is similar to her press episode since October of last year-feels no different, patient is now being admitted for acute on chronic abdominal pain, acute GI bleeding with orthostasis, severe hypokalemia, hyponatremia, hypochloremia.  PAST MEDICAL HISTORY:   See above  PAST SURGICAL HISTORY:  Colonoscopy  SOCIAL HISTORY:  Social History   Tobacco Use  . Smoking status: Current Every Day Smoker    Packs/day: 1.00    Types: Cigarettes  . Smokeless tobacco: Never Used  Substance Use Topics  . Alcohol use: Not Currently    FAMILY HISTORY:  Family History  Problem Relation Age of Onset  . Breast cancer Maternal Grandmother 78    DRUG ALLERGIES: No Known Allergies  REVIEW OF SYSTEMS:   CONSTITUTIONAL: No  fever, +fatigue  EYES: No blurred or double vision.  EARS, NOSE, AND THROAT: No tinnitus or ear pain.  RESPIRATORY: No cough, shortness of breath, wheezing or hemoptysis.  CARDIOVASCULAR: No chest pain, orthopnea, edema.  GASTROINTESTINAL: + nausea, vomiting, abdominal pain.  GENITOURINARY: No dysuria, hematuria.  ENDOCRINE: No polyuria, nocturia,  HEMATOLOGY: No anemia, easy bruising or bleeding SKIN: No rash or lesion. MUSCULOSKELETAL: No joint pain or arthritis.   NEUROLOGIC: No tingling, numbness, weakness. + Lightheadedness, loss of consciousness PSYCHIATRY: No anxiety or depression.   MEDICATIONS AT HOME:  Prior to Admission medications   Medication Sig Start Date End Date Taking? Authorizing Provider  lisinopril (ZESTRIL) 5 MG tablet Take 5 mg by mouth daily. 08/24/18  Yes [provider]  traMADol (ULTRAM) 50 MG tablet Take 50 mg by mouth 2 (two) times daily as needed. 11/03/18  Yes [provider]  vitamin B-12 (CYANOCOBALAMIN) 1000 MCG tablet Take 1,000 mcg by mouth daily.   Yes [provider]  albuterol (VENTOLIN HFA) 108 (90 Base) MCG/ACT inhaler Inhale 2 puffs into the lungs every 4 (four) hours as needed for shortness of breath or wheezing. 11/08/18   [provider]      PHYSICAL EXAMINATION:   VITAL SIGNS: Blood pressure 107/75, pulse (!) 104, temperature 98.5 F (36.9 C), temperature source Oral, resp. rate 15, height 5\' 5"  (1.651 m), weight 66.7 kg, SpO2 97 %.  GENERAL:  43 y.o.-year-old patient lying in the bed with no acute distress.  EYES: Pupils equal, round, reactive to light and accommodation. No scleral icterus. Extraocular muscles intact.  HEENT: Head atraumatic, normocephalic. Oropharynx and nasopharynx clear.  NECK:  Supple, no jugular  venous distention. No thyroid enlargement, no tenderness.  LUNGS: Normal breath sounds bilaterally, no wheezing, rales,rhonchi or crepitation. No use of accessory muscles of respiration.   CARDIOVASCULAR: S1, S2 normal. No murmurs, rubs, or gallops.  ABDOMEN: Soft, nontender, nondistended. Bowel sounds present. No organomegaly or mass.  EXTREMITIES: No pedal edema, cyanosis, or clubbing.  NEUROLOGIC: Cranial nerves II through XII are intact. Muscle strength 5/5 in all extremities. Sensation intact. Gait not checked.  PSYCHIATRIC: The patient is alert and oriented x 3.  SKIN: No obvious rash, lesion, or ulcer.  Pale skin  LABORATORY PANEL:   CBC Recent Labs  Lab 12/30/18 1204  WBC 5.5  HGB 7.0*  HCT 19.7*  PLT 132*  MCV 97.5  MCH 34.7*  MCHC 35.5  RDW 13.9   ------------------------------------------------------------------------------------------------------------------  Chemistries  Recent Labs  Lab 12/30/18 1204  NA 131*  K 2.4*  CL 81*  CO2 32  GLUCOSE 122*  BUN 11  CREATININE 0.76  CALCIUM 8.2*  AST 67*  ALT 18  ALKPHOS 208*  BILITOT 3.2*   ------------------------------------------------------------------------------------------------------------------ estimated creatinine clearance is 80.8 mL/min (by C-G formula based on SCr of 0.76 mg/dL). ------------------------------------------------------------------------------------------------------------------ No results for input(s): TSH, T4TOTAL, T3FREE, THYROIDAB in the last 72 hours.  Invalid input(s): FREET3   Coagulation profile No results for input(s): INR, PROTIME in the last 168 hours. ------------------------------------------------------------------------------------------------------------------- No results for input(s): DDIMER in the last 72 hours. -------------------------------------------------------------------------------------------------------------------  Cardiac Enzymes Recent Labs  Lab 12/30/18 1204  TROPONINI <0.03   ------------------------------------------------------------------------------------------------------------------ Invalid input(s):  POCBNP  ---------------------------------------------------------------------------------------------------------------  Urinalysis    Component Value Date/Time   COLORURINE YELLOW (A) 12/30/2018 1512   APPEARANCEUR HAZY (A) 12/30/2018 1512   LABSPEC 1.005 12/30/2018 1512   PHURINE 7.0 12/30/2018 1512   GLUCOSEU NEGATIVE 12/30/2018 1512   HGBUR MODERATE (A) 12/30/2018 1512   BILIRUBINUR NEGATIVE 12/30/2018 [place] 12/30/2018 1512   PROTEINUR NEGATIVE 12/30/2018 1512   NITRITE NEGATIVE 12/30/2018 1512   LEUKOCYTESUR NEGATIVE 12/30/2018 1512     RADIOLOGY: Ct Head Wo Contrast  Result Date: 12/30/2018 CLINICAL DATA:  Syncope. Patient reportedly hit head against solid object at the time of syncope. Vomiting. EXAM: CT HEAD WITHOUT CONTRAST TECHNIQUE: Contiguous axial images were obtained from the base of the skull through the vertex without intravenous contrast. COMPARISON:  None. FINDINGS: Brain: The ventricles are normal in size and configuration. There is no intracranial mass, hemorrhage, extra-axial fluid collection, or midline shift. The brain parenchyma appears unremarkable. No evident acute infarct. Vascular: No hyperdense vessel. There is no appreciable vascular calcification. Skull: The bony calvarium appears intact. Sinuses/Orbits: Visualized paranasal sinuses are clear. Visualized orbits appear symmetric bilaterally. Other: Mastoid air cells are clear. There is an apparent skin nevus along the left frontal region. IMPRESSION: Study within normal limits. Electronically Signed   By: Lowella Grip III M.D.   On: 12/30/2018 15:42   Ct Abdomen Pelvis W Contrast  Result Date: 12/30/2018 CLINICAL DATA:  Vomiting EXAM: CT ABDOMEN AND PELVIS WITH CONTRAST TECHNIQUE: Multidetector CT imaging of the abdomen and pelvis was performed using the standard protocol following bolus administration of intravenous contrast. CONTRAST:  145mL OMNIPAQUE IOHEXOL 300 MG/ML SOLN,  additional oral enteric contrast COMPARISON:  None. FINDINGS: Lower chest: No acute abnormality. Hepatobiliary: Hepatomegaly and hepatic steatosis, maximum coronal span 21 cm. Status post cholecystectomy. No gallbladder wall thickening, or biliary dilatation. Pancreas: Unremarkable. No pancreatic ductal dilatation or surrounding inflammatory changes. Spleen: Splenomegaly, maximum coronal span 15.1 cm. Adrenals/Urinary Tract: Adrenal glands are  unremarkable. Kidneys are normal, without renal calculi, focal lesion, or hydronephrosis. Bladder is unremarkable. Stomach/Bowel: Stomach is within normal limits. Appendix appears normal. There is extensive fatty mural stratification of the terminal ileum, cecum, and rectum, with mild, well thickening, fat stranding, and vascular combing about the remaining colon (e.g. series 2, image 46, 70). Vascular/Lymphatic: Mixed calcific atherosclerosis. No enlarged abdominal or pelvic lymph nodes. Reproductive: No mass or other abnormality. Other: No abdominal wall hernia or abnormality. Small volume of fluid in the low abdomen. Musculoskeletal: No acute or significant osseous findings. IMPRESSION: 1. There is extensive fatty mural stratification of the terminal ileum, cecum, and rectum, with mild, well thickening, fat stranding, and vascular combing about the remaining colon (e.g. series 2, image 46, 70). Findings are consistent with nonspecific infectious, inflammatory, or ischemic colitis, and particularly chronic or recurrent etiology such as inflammatory bowel disease. 2.  Hepatomegaly and hepatic steatosis. 3.  Splenomegaly. 4.  Small volume nonspecific fluid in the low abdomen. Electronically Signed   By: Eddie Candle M.D.   On: 12/30/2018 15:53    EKG: Orders placed or performed during the hospital encounter of 12/30/18  . ED EKG  . ED EKG  . EKG 12-Lead  . EKG 12-Lead    IMPRESSION AND PLAN: *Acute on chronic abdominal pain Ongoing for 8 months, followed by Dr.  Alice Reichert, had colonoscopy December 2019 noted for polyps/diverticulosis, CT abdomen noted for inflammatory bowel disease versus infection with hepatomegaly/hepatic steatosis/splenomegaly Admit to regular nursing for bed, consult gastroenterology for expert opinion, will hold off on antibiotics at this time, check lactic acid level, CRP, IV fluids for rehydration, adult pain protocol  *Acute GI bleeding/guaiac positive Most likely secondary to diverticulosis Transfused 2 units packed red blood cells, anemia work-up and treat as indicated  *Acute blood loss anemia Plan of care as stated above  *Acute orthostasis  Likely due to GI bleeding-noted history of diverticulosis Fall precautions  *History of vitamin D and B12 deficiency Continue oral supplementations   All the records are reviewed and case discussed with ED provider. Management plans discussed with the patient, family and they are in agreement.  CODE STATUS:full   TOTAL TIME TAKING CARE OF THIS PATIENT: 40 minutes.    Avel Peace Salary M.D on 12/30/2018   Between 7am to 6pm - Pager - [phone]  After 6pm go to [email or web address] - password [email or web address] [facility]  Office  [phone]  CC: Primary care physician; Isaias Sakai, DO   Note: This dictation was prepared with Dragon dictation along with smaller phrase technology. Any transcriptional errors that result from this process are unintentional.

## 2018-12-30 NOTE — Progress Notes (Signed)
At start of shift pt had blood transfusing.  Pt needed protonix drip initial dose and then maintenance dose given but also needed potassium runs.  Started first potassium run but pt unable to tolerate even titrated with .9 NS. Stopped infusion and got order for po potassium.  Blood transfusion stopped and waiting on CBC before next unit given. Awaiting result before starting IVF or next unit. Pt has had two bm, one which was incontinent.  Brown bm with no evidence of bleeding. Dorna Bloom RN

## 2018-12-30 NOTE — ED Triage Notes (Signed)
Pt here with c/o vomiting every day for a week and a half. States she is unable to keep anything down. Denies any other symptoms, NAD.

## 2018-12-30 NOTE — Progress Notes (Signed)
Family Meeting Note  Advance Directive:yes  Today a meeting took place with the Patient.  Patient is able to participate   The following clinical team members were present during this meeting:MD  The following were discussed:Patient's diagnosis:42 y.o. female with a known history of chronic abdominal pain-followed by Dr. Alice Reichert, had colonoscopy done in December of last year noted for polyps/diverticulosis, vitamin D deficiency, vitamin B12 deficiency, presents emergency room with 10-day history of intractable nausea/emesis, inability to tolerate p.o. intake, lightheadedness, dizziness, generalized weakness, fatigue, had sustained a fall as well without hitting her head, and emergency room patient was found to be tachycardic, hemoglobin 7, sodium 131, potassium 2.4, CT abdomen noted for inflammatory bowel disease versus infection/hepatomegaly/hepatic steatosis/splenomegaly, guaiac positive, patient valuated in the emergency room, patient in no apparent distress, resting comfortably in bed, patient states that her abdominal pain is similar to her press episode since October of last year-feels no different, patient is now being admitted for acute on chronic abdominal pain, acute GI bleeding with orthostasis, severe hypokalemia, hyponatremia, hypochloremia.  , Patient's progosis: Unable to determine and Goals for treatment: Full Code  Additional follow-up to be provided: prn  Time spent during discussion:20 minutes  Gorden Harms, MD

## 2018-12-30 NOTE — ED Notes (Signed)
Yellow fall band applied to pt wrist in triage. Pt informed to call for assistance before getting out of bed.

## 2018-12-30 NOTE — ED Triage Notes (Signed)
Pt states she passed out Monday from not eating, denies hitting her head, unwitnessed fall.

## 2018-12-31 ENCOUNTER — Inpatient Hospital Stay: Payer: 59 | Admitting: Anesthesiology

## 2018-12-31 ENCOUNTER — Encounter
Admission: EM | Disposition: A | Discharge status: Home / Self Care | Payer: Self-pay | Source: Home / Self Care | Attending: Internal Medicine

## 2018-12-31 ENCOUNTER — Encounter: Payer: Self-pay | Admitting: Anesthesiology

## 2018-12-31 ENCOUNTER — Inpatient Hospital Stay: Payer: 59

## 2018-12-31 DIAGNOSIS — E860 Dehydration: Secondary | ICD-10-CM

## 2018-12-31 DIAGNOSIS — K253 Acute gastric ulcer without hemorrhage or perforation: Secondary | ICD-10-CM

## 2018-12-31 DIAGNOSIS — D649 Anemia, unspecified: Secondary | ICD-10-CM

## 2018-12-31 DIAGNOSIS — D539 Nutritional anemia, unspecified: Secondary | ICD-10-CM

## 2018-12-31 DIAGNOSIS — R112 Nausea with vomiting, unspecified: Secondary | ICD-10-CM

## 2018-12-31 DIAGNOSIS — G43A1 Cyclical vomiting, intractable: Secondary | ICD-10-CM

## 2018-12-31 DIAGNOSIS — R1110 Vomiting, unspecified: Secondary | ICD-10-CM

## 2018-12-31 DIAGNOSIS — K922 Gastrointestinal hemorrhage, unspecified: Secondary | ICD-10-CM

## 2018-12-31 HISTORY — PX: ESOPHAGOGASTRODUODENOSCOPY (EGD) WITH PROPOFOL: SHX5813

## 2018-12-31 LAB — FIBRIN DERIVATIVES D-DIMER (ARMC ONLY): Fibrin derivatives D-dimer (ARMC): 135.48 ng/mL (FEU) (ref 0.00–499.00)

## 2018-12-31 LAB — CBC
HCT: 15.5 % — ABNORMAL LOW (ref 36.0–46.0)
HCT: 20.9 % — ABNORMAL LOW (ref 36.0–46.0)
Hemoglobin: 5.4 g/dL — ABNORMAL LOW (ref 12.0–15.0)
Hemoglobin: 7.4 g/dL — ABNORMAL LOW (ref 12.0–15.0)
MCH: 34.2 pg — ABNORMAL HIGH (ref 26.0–34.0)
MCH: 32.3 pg (ref 26.0–34.0)
MCHC: 34.8 g/dL (ref 30.0–36.0)
MCHC: 35.4 g/dL (ref 30.0–36.0)
MCV: 98.1 fL (ref 80.0–100.0)
MCV: 91.3 fL (ref 80.0–100.0)
Platelets: 92 10*3/uL — ABNORMAL LOW (ref 150–400)
Platelets: 99 10*3/uL — ABNORMAL LOW (ref 150–400)
RBC: 1.58 MIL/uL — ABNORMAL LOW (ref 3.87–5.11)
RBC: 2.29 MIL/uL — ABNORMAL LOW (ref 3.87–5.11)
RDW: 14.5 % (ref 11.5–15.5)
RDW: 18.6 % — ABNORMAL HIGH (ref 11.5–15.5)
WBC: 4.5 10*3/uL (ref 4.0–10.5)
WBC: 4.7 10*3/uL (ref 4.0–10.5)
nRBC: 0 % (ref 0.0–0.2)
nRBC: 0 % (ref 0.0–0.2)

## 2018-12-31 LAB — BASIC METABOLIC PANEL
Anion gap: 11 (ref 5–15)
BUN: 13 mg/dL (ref 6–20)
CO2: 29 mmol/L (ref 22–32)
Calcium: 7 mg/dL — ABNORMAL LOW (ref 8.9–10.3)
Chloride: 93 mmol/L — ABNORMAL LOW (ref 98–111)
Creatinine, Ser: 0.77 mg/dL (ref 0.44–1.00)
GFR calc Af Amer: >60 mL/min (ref >60)
GFR calc non Af Amer: >60 mL/min (ref >60)
Glucose, Bld: 86 mg/dL (ref 70–99)
Potassium: 2.6 mmol/L — CL (ref 3.5–5.1)
Sodium: 133 mmol/L — ABNORMAL LOW (ref 135–145)

## 2018-12-31 LAB — APTT: aPTT: 32 seconds (ref 24–36)

## 2018-12-31 LAB — PROTIME-INR
INR: 1.2 (ref 0.8–1.2)
Prothrombin Time: 14.9 seconds (ref 11.4–15.2)

## 2018-12-31 LAB — CK: Total CK: 9 U/L — ABNORMAL LOW (ref 38–234)

## 2018-12-31 LAB — LACTATE DEHYDROGENASE: LDH: 89 U/L — ABNORMAL LOW (ref 98–192)

## 2018-12-31 LAB — FIBRINOGEN: Fibrinogen: 271 mg/dL (ref 210–475)

## 2018-12-31 LAB — BILIRUBIN, FRACTIONATED(TOT/DIR/INDIR)
Bilirubin, Direct: 1.3 mg/dL — ABNORMAL HIGH (ref 0.0–0.2)
Indirect Bilirubin: 1.5 mg/dL — ABNORMAL HIGH (ref 0.3–0.9)
Total Bilirubin: 2.8 mg/dL — ABNORMAL HIGH (ref 0.3–1.2)

## 2018-12-31 LAB — DAT, POLYSPECIFIC AHG (ARMC ONLY): Polyspecific AHG test: NEGATIVE

## 2018-12-31 LAB — POTASSIUM: Potassium: 3.4 mmol/L — ABNORMAL LOW (ref 3.5–5.1)

## 2018-12-31 LAB — PREPARE RBC (CROSSMATCH)

## 2018-12-31 SURGERY — ESOPHAGOGASTRODUODENOSCOPY (EGD) WITH PROPOFOL
Anesthesia: General

## 2018-12-31 MED ORDER — POTASSIUM CHLORIDE CRYS ER 20 MEQ PO TBCR
40.0000 meq | EXTENDED_RELEASE_TABLET | Freq: Once | ORAL | Status: AC
  Administered 2018-12-31: 08:00:00 40 meq via ORAL
  Filled 2018-12-31: qty 2

## 2018-12-31 MED ORDER — PROPOFOL 500 MG/50ML IV EMUL
INTRAVENOUS | Status: AC
  Filled 2018-12-31: qty 50

## 2018-12-31 MED ORDER — POTASSIUM CHLORIDE CRYS ER 20 MEQ PO TBCR
20.0000 meq | EXTENDED_RELEASE_TABLET | Freq: Once | ORAL | Status: AC
  Administered 2018-12-31: 20 meq via ORAL
  Filled 2018-12-31: qty 1

## 2018-12-31 MED ORDER — METRONIDAZOLE 500 MG PO TABS
500.0000 mg | ORAL_TABLET | Freq: Three times a day (TID) | ORAL | Status: DC
  Administered 2018-12-31 – 2019-01-01 (×3): 500 mg via ORAL
  Filled 2018-12-31 (×3): qty 1

## 2018-12-31 MED ORDER — FENTANYL CITRATE (PF) 100 MCG/2ML IJ SOLN
INTRAMUSCULAR | Status: AC
  Filled 2018-12-31: qty 2

## 2018-12-31 MED ORDER — METRONIDAZOLE IN NACL 5-0.79 MG/ML-% IV SOLN
500.0000 mg | Freq: Three times a day (TID) | INTRAVENOUS | Status: DC
  Filled 2018-12-31 (×4): qty 100

## 2018-12-31 MED ORDER — PANTOPRAZOLE SODIUM 40 MG IV SOLR
40.0000 mg | Freq: Two times a day (BID) | INTRAVENOUS | Status: DC
  Administered 2018-12-31 – 2019-01-01 (×2): 40 mg via INTRAVENOUS
  Filled 2018-12-31 (×2): qty 40

## 2018-12-31 MED ORDER — CIPROFLOXACIN IN D5W 400 MG/200ML IV SOLN
400.0000 mg | Freq: Two times a day (BID) | INTRAVENOUS | Status: DC
  Administered 2018-12-31: 400 mg via INTRAVENOUS
  Filled 2018-12-31 (×3): qty 200

## 2018-12-31 MED ORDER — PROPOFOL 500 MG/50ML IV EMUL
INTRAVENOUS | Status: DC | PRN
  Administered 2018-12-31: 120 ug/kg/min via INTRAVENOUS

## 2018-12-31 MED ORDER — LIDOCAINE HCL (CARDIAC) PF 100 MG/5ML IV SOSY
PREFILLED_SYRINGE | INTRAVENOUS | Status: DC | PRN
  Administered 2018-12-31: 100 mg via INTRAVENOUS

## 2018-12-31 MED ORDER — SODIUM CHLORIDE 0.9 % IV SOLN
INTRAVENOUS | Status: DC | PRN
  Administered 2018-12-31: 13:00:00 via INTRAVENOUS

## 2018-12-31 MED ORDER — POTASSIUM CHLORIDE 10 MEQ/100ML IV SOLN: 10.0000 meq | INTRAVENOUS | Status: DC

## 2018-12-31 MED ORDER — PROPOFOL 10 MG/ML IV BOLUS
INTRAVENOUS | Status: DC | PRN
  Administered 2018-12-31: 50 mg via INTRAVENOUS
  Administered 2018-12-31: 20 mg via INTRAVENOUS

## 2018-12-31 MED ORDER — CIPROFLOXACIN HCL 500 MG PO TABS
500.0000 mg | ORAL_TABLET | Freq: Two times a day (BID) | ORAL | Status: DC
  Administered 2018-12-31 – 2019-01-01 (×2): 500 mg via ORAL
  Filled 2018-12-31 (×2): qty 1

## 2018-12-31 MED ORDER — MAGNESIUM SULFATE 4 GM/100ML IV SOLN
4.0000 g | Freq: Once | INTRAVENOUS | Status: AC
  Administered 2018-12-31: 11:00:00 4 g via INTRAVENOUS
  Filled 2018-12-31: qty 100

## 2018-12-31 MED ORDER — GLYCOPYRROLATE 0.2 MG/ML IJ SOLN
INTRAMUSCULAR | Status: DC | PRN
  Administered 2018-12-31: 0.2 mg via INTRAVENOUS

## 2018-12-31 MED ORDER — FENTANYL CITRATE (PF) 100 MCG/2ML IJ SOLN
INTRAMUSCULAR | Status: DC | PRN
  Administered 2018-12-31 (×2): 50 ug via INTRAVENOUS

## 2018-12-31 NOTE — Anesthesia Preprocedure Evaluation (Addendum)
Anesthesia Evaluation  Patient identified by MRN, date of birth, ID band Patient awake    Reviewed: Allergy & Precautions, NPO status , Patient's Chart, lab work & pertinent test results  Airway Mallampati: III       Dental   Pulmonary Current Smoker,    Pulmonary exam normal        Cardiovascular negative cardio ROS Normal cardiovascular exam     Neuro/Psych negative neurological ROS  negative psych ROS   GI/Hepatic Neg liver ROS,   Endo/Other  negative endocrine ROS  Renal/GU negative Renal ROS  negative genitourinary   Musculoskeletal negative musculoskeletal ROS (+)   Abdominal Normal abdominal exam  (+)   Peds negative pediatric ROS (+)  Hematology  (+) anemia ,   Anesthesia Other Findings URI 11/06/18  Reproductive/Obstetrics                            Anesthesia Physical Anesthesia Plan  ASA: II  Anesthesia Plan: General   Post-op Pain Management:    Induction: Intravenous  PONV Risk Score and Plan: Propofol infusion  Airway Management Planned: Nasal Cannula  Additional Equipment:   Intra-op Plan:   Post-operative Plan:   Informed Consent: I have reviewed the patients History and Physical, chart, labs and discussed the procedure including the risks, benefits and alternatives for the proposed anesthesia with the patient or authorized representative who has indicated his/her understanding and acceptance.     Dental advisory given  Plan Discussed with: CRNA and Surgeon  Anesthesia Plan Comments:         Anesthesia Quick Evaluation

## 2018-12-31 NOTE — Progress Notes (Signed)
Hgb 7.4.  Spoke with Dr Marcille Blanco.  Hold off on further blood transfusions.  Critical potassium of 2.6 with standing order for po potassium q 4 hours.  Last dose given 0443.  Pt with .9 % NaCl with KCL 40 mEq/l infusing at 100 cc/hr. Dorna Bloom RN

## 2018-12-31 NOTE — Op Note (Signed)
[facility] Patient Name: Morgan Sanders Procedure Date: 12/31/2018 1:28 PM MRN: [id number] Account #: [id number] Date of Birth: July 07, 1976 Admit Type: Inpatient Age: 43 Room: [facility] ENDO ROOM 4 Gender: Female Note Status: Finalized Procedure:            Upper GI endoscopy Indications:          Nausea with vomiting Providers:            Lucilla Lame MD, MD Referring MD:         No Local Md, MD (Referring MD) Medicines:            Propofol per Anesthesia Complications:        No immediate complications. Procedure:            Pre-Anesthesia Assessment:                       - Prior to the procedure, a History and Physical was                        performed, and patient medications and allergies were                        reviewed. The patient's tolerance of previous                        anesthesia was also reviewed. The risks and benefits of                        the procedure and the sedation options and risks were                        discussed with the patient. All questions were                        answered, and informed consent was obtained. Prior                        Anticoagulants: The patient has taken no previous                        anticoagulant or antiplatelet agents. ASA Grade                        Assessment: II - A patient with mild systemic disease.                        After reviewing the risks and benefits, the patient was                        deemed in satisfactory condition to undergo the                        procedure.                       After obtaining informed consent, the endoscope was                        passed under direct vision. Throughout the procedure,  the patient's blood pressure, pulse, and oxygen                        saturations were monitored continuously. The Endoscope                        was introduced through the mouth, and advanced to the     second part of duodenum. The upper GI endoscopy was                        accomplished without difficulty. The patient tolerated                        the procedure well. Findings:      The Z-line was irregular and was found at the gastroesophageal junction.      Two non-bleeding superficial gastric ulcers with no stigmata of bleeding       were found in the gastric antrum. Biopsies were taken with a cold       forceps for histology.      The examined duodenum was normal. Impression:           - Z-line irregular, at the gastroesophageal junction.                       - Non-bleeding gastric ulcers with no stigmata of                        bleeding. Biopsied.                       - Normal examined duodenum. Recommendation:       - Return patient to hospital ward for ongoing care.                       - Clear liquid diet.                       - Continue present medications.                       - Await pathology results.                       - Perform a colonoscopy tomorrow. Procedure Code(s):    --- Professional ---                       [phone], Esophagogastroduodenoscopy, flexible, transoral;                        with biopsy, single or multiple Diagnosis Code(s):    --- Professional ---                       R11.2, Nausea with vomiting, unspecified                       K25.9, Gastric ulcer, unspecified as acute or chronic,                        without hemorrhage or perforation CPT copyright 2019 [employer]. All rights reserved. The codes documented in this report are preliminary and upon coder review  may  be revised to meet current compliance requirements. Lucilla Lame MD, MD 12/31/2018 1:59:21 PM This report has been signed electronically. Number of Addenda: 0 Note Initiated On: 12/31/2018 1:28 PM      [facility]

## 2018-12-31 NOTE — Anesthesia Procedure Notes (Signed)
Performed by: Mahaila Tischer, CRNA Pre-anesthesia Checklist: Patient identified, Emergency Drugs available, Suction available, Patient being monitored and Timeout performed Patient Re-evaluated:Patient Re-evaluated prior to induction Oxygen Delivery Method: Nasal cannula Induction Type: IV induction       

## 2018-12-31 NOTE — Consult Note (Signed)
Morgan Lame, MD [facility]  [address], [place], [place] [address] Phone: [phone] Fax : [phone]  Consultation  Referring Provider:     Dr. Jerelyn Charles Primary Care Physician:  Morgan Sakai, DO Primary Gastroenterologist:  Dr. Alice Reichert         Reason for Consultation:     Abdominal pain  Date of Admission:  12/30/2018 Date of Consultation:  12/31/2018         HPI:   Morgan ANASTAS is a 43 y.o. female who has been following with Dr. Alice Reichert for ongoing abdominal pain for approximately 8 months.  The patient had a colonoscopy in December 2019 and had some polyps and diverticulosis.  The patient states that she has never really felt better but continues to have abdominal pain with nausea and vomiting.  She states that she finally came in because she was not able to keep anything down.  On admission the patient had a CT scan that was suggestive of inflammation with a differential diagnosis of inflammatory bowel disease with hepatomegaly and splenomegaly.  There is also a note of fatty liver disease.  Her blood work showed an elevated lactic acid level and an elevated CRP.  The patient's hemoglobin on admission was 7 with a decrease to 5.4.  That was yesterday evening and the patient received 2 units of blood and this morning had a hemoglobin of 7.4.  Patient denies any black stools or bloody stools.  She was noted to be heme positive.  The patient's potassium was also low and required potassium replacement. Patient denies any alcohol abuse but states she used to drink a lot heavier than she has been recently.  She did have thrombocytopenia on admission at 132 that came down to 99 this morning.  The patient's ferritin was noted to be 1348.  The patient's reticulocyte count and absolute number were significantly low.  The patient's folate was also noted to be low with her B12 being elevated.  His total iron was normal at 82.   History reviewed. No pertinent past medical  history.  History reviewed. No pertinent surgical history.  Prior to Admission medications   Medication Sig Start Date End Date Taking? Authorizing Provider  lisinopril (ZESTRIL) 5 MG tablet Take 5 mg by mouth daily. 08/24/18  Yes [provider]  traMADol (ULTRAM) 50 MG tablet Take 50 mg by mouth 2 (two) times daily as needed. 11/03/18  Yes [provider]  vitamin B-12 (CYANOCOBALAMIN) 1000 MCG tablet Take 1,000 mcg by mouth daily.   Yes [provider]  albuterol (VENTOLIN HFA) 108 (90 Base) MCG/ACT inhaler Inhale 2 puffs into the lungs every 4 (four) hours as needed for shortness of breath or wheezing. 11/08/18   [provider]    Family History  Problem Relation Age of Onset   Breast cancer Maternal Grandmother 78     Social History   Tobacco Use   Smoking status: Current Every Day Smoker    Packs/day: 1.00    Types: Cigarettes   Smokeless tobacco: Never Used  Substance Use Topics   Alcohol use: Not Currently   Drug use: Not on file    Allergies as of 12/30/2018   (No Known Allergies)    Review of Systems:    All systems reviewed and negative except where noted in HPI.   Physical Exam:  Vital signs in last 24 hours: Temp:  [98.2 F (36.8 C)-99.8 F (37.7 C)] 98.2 F (36.8 C) (05/15  6269) Pulse Rate:  [75-107] 82 (05/15 0821) Resp:  [7-24] 16 (05/15 0821) BP: (87-107)/(51-75) 92/57 (05/15 0821) SpO2:  [89 %-100 %] 94 % (05/15 0821) Weight:  [90.3 kg] 90.3 kg (05/14 1857) Last BM Date: 12/31/18 General:   Pleasant, cooperative in NAD Head:  Normocephalic and atraumatic. Eyes:   No icterus.   Conjunctiva pink. PERRLA. Ears:  Normal auditory acuity. Neck:  Supple; no masses or thyroidomegaly Lungs: Respirations even and unlabored. Lungs clear to auscultation bilaterally.   No wheezes, crackles, or rhonchi.  Heart:  Regular rate and rhythm;  Without murmur, clicks, rubs or gallops Abdomen:  Soft, nondistended, nontender.  Normal bowel sounds. No appreciable masses or hepatomegaly.  No rebound or guarding.  Rectal:  Not performed. Msk:  Symmetrical without gross deformities.    Extremities:  Without edema, cyanosis or clubbing. Neurologic:  Alert and oriented x3;  grossly normal neurologically. Skin:  Intact without significant lesions or rashes. Cervical Nodes:  No significant cervical adenopathy. Psych:  Alert and cooperative. Normal affect.  LAB RESULTS: Recent Labs    12/30/18 1204 12/30/18 1803 12/31/18 0526  WBC 5.5 4.5 4.7  HGB 7.0* 5.4* 7.4*  HCT 19.7* 15.5* 20.9*  PLT 132* 92* 99*   BMET Recent Labs    12/30/18 1204 12/31/18 0526 12/31/18 1121  NA 131* 133*  --   K 2.4* 2.6* 3.4*  CL 81* 93*  --   CO2 32 29  --   GLUCOSE 122* 86  --   BUN 11 13  --   CREATININE 0.76 0.77  --   CALCIUM 8.2* 7.0*  --    LFT Recent Labs    12/30/18 1204  PROT 7.0  ALBUMIN 3.0*  AST 67*  ALT 18  ALKPHOS 208*  BILITOT 3.2*   PT/INR No results for input(s): LABPROT, INR in the last 72 hours.  STUDIES: Ct Head Wo Contrast  Result Date: 12/30/2018 CLINICAL DATA:  Syncope. Patient reportedly hit head against solid object at the time of syncope. Vomiting. EXAM: CT HEAD WITHOUT CONTRAST TECHNIQUE: Contiguous axial images were obtained from the base of the skull through the vertex without intravenous contrast. COMPARISON:  None. FINDINGS: Brain: The ventricles are normal in size and configuration. There is no intracranial mass, hemorrhage, extra-axial fluid collection, or midline shift. The brain parenchyma appears unremarkable. No evident acute infarct. Vascular: No hyperdense vessel. There is no appreciable vascular calcification. Skull: The bony calvarium appears intact. Sinuses/Orbits: Visualized paranasal sinuses are clear. Visualized orbits appear symmetric bilaterally. Other: Mastoid air cells are clear. There is an apparent skin nevus along the left frontal region. IMPRESSION: Study within  normal limits. Electronically Signed   By: Lowella Grip III M.D.   On: 12/30/2018 15:42   Ct Abdomen Pelvis W Contrast  Result Date: 12/30/2018 CLINICAL DATA:  Vomiting EXAM: CT ABDOMEN AND PELVIS WITH CONTRAST TECHNIQUE: Multidetector CT imaging of the abdomen and pelvis was performed using the standard protocol following bolus administration of intravenous contrast. CONTRAST:  112mL OMNIPAQUE IOHEXOL 300 MG/ML SOLN, additional oral enteric contrast COMPARISON:  None. FINDINGS: Lower chest: No acute abnormality. Hepatobiliary: Hepatomegaly and hepatic steatosis, maximum coronal span 21 cm. Status post cholecystectomy. No gallbladder wall thickening, or biliary dilatation. Pancreas: Unremarkable. No pancreatic ductal dilatation or surrounding inflammatory changes. Spleen: Splenomegaly, maximum coronal span 15.1 cm. Adrenals/Urinary Tract: Adrenal glands are unremarkable. Kidneys are normal, without renal calculi, focal lesion, or hydronephrosis. Bladder is unremarkable. Stomach/Bowel: Stomach is within normal limits. Appendix appears normal. There  is extensive fatty mural stratification of the terminal ileum, cecum, and rectum, with mild, well thickening, fat stranding, and vascular combing about the remaining colon (e.g. series 2, image 46, 70). Vascular/Lymphatic: Mixed calcific atherosclerosis. No enlarged abdominal or pelvic lymph nodes. Reproductive: No mass or other abnormality. Other: No abdominal wall hernia or abnormality. Small volume of fluid in the low abdomen. Musculoskeletal: No acute or significant osseous findings. IMPRESSION: 1. There is extensive fatty mural stratification of the terminal ileum, cecum, and rectum, with mild, well thickening, fat stranding, and vascular combing about the remaining colon (e.g. series 2, image 46, 70). Findings are consistent with nonspecific infectious, inflammatory, or ischemic colitis, and particularly chronic or recurrent etiology such as inflammatory  bowel disease. 2.  Hepatomegaly and hepatic steatosis. 3.  Splenomegaly. 4.  Small volume nonspecific fluid in the low abdomen. Electronically Signed   By: Eddie Candle M.D.   On: 12/30/2018 15:53      Impression / Plan:   Assessment: Active Problems:   GIB (gastrointestinal bleeding)   RONIYAH LLORENS is a 43 y.o. y/o female with anemia with normal iron and elevated B12 and low folate.  The patient's stools were heme positive and she had profound anemia on admission.  The patient's blood work showed her to have an elevated CRP with a low reticulocyte percent and absolute number.  The patient has been having intractable nausea and vomiting and she reports it is not always associated with food and sometimes will occur multiple times throughout the day even when she has not eaten.  The patient's CT scan also showed findings consistent with acute inflammation and possibly inflammatory bowel disease despite the patient denying any abdominal pain or diarrhea.  Plan:  The patient will be set up for an EGD due to her intractable nausea and vomiting.  The patient has anemia that does not appear to be from chronic blood loss.  Despite having a colonoscopy in December 2019 with only diverticulosis and some polyps the CT scan is suggestive of a inflammatory process with a differential diagnosis of inflammatory bowel disease versus ischemia versus infection.  If nothing is found on the upper endoscopy the patient may need a repeat colonoscopy to assess the areas that were noted to be inflamed.  The patient's liver enzymes were also elevated with an AST being elevated at 67 with the ALT being normal and an elevated alkaline phosphatase of 208 and bilirubin of 3.2.  It appears that the patient's alkaline phosphatase was elevated at 125 back in January of this year her AST was elevated at 84.  The patient has been explained the plan and agrees with it.  Thank you for involving me in the care of this patient.       LOS: 1 day   Morgan Lame, MD  12/31/2018, 12:36 PM    Note: This dictation was prepared with Dragon dictation along with smaller phrase technology. Any transcriptional errors that result from this process are unintentional.

## 2018-12-31 NOTE — Progress Notes (Signed)
Second unit of blood given. IVF started and protonix gtt continues. Dorna Bloom RN

## 2018-12-31 NOTE — Progress Notes (Signed)
Per pt she does not want to have colonoscopy. MD Allen Norris notified and MD [facility] notified.

## 2018-12-31 NOTE — Anesthesia Postprocedure Evaluation (Signed)
Anesthesia Post Note  Patient: RAJAH LAMBA  Procedure(s) Performed: ESOPHAGOGASTRODUODENOSCOPY (EGD) WITH PROPOFOL (N/A )  Patient location during evaluation: PACU Anesthesia Type: General Level of consciousness: awake and alert and oriented Pain management: pain level controlled Vital Signs Assessment: post-procedure vital signs reviewed and stable Respiratory status: spontaneous breathing Cardiovascular status: blood pressure returned to baseline Anesthetic complications: no     Last Vitals:  Vitals:   12/31/18 1410 12/31/18 1508  BP: 98/69 98/71  Pulse:  85  Resp:  16  Temp:  36.8 C  SpO2:  100%    Last Pain:  Vitals:   12/31/18 1411  TempSrc:   PainSc: 0-No pain                 Shaquel Chavous

## 2018-12-31 NOTE — Transfer of Care (Signed)
Immediate Anesthesia Transfer of Care Note  Patient: Morgan Sanders  Procedure(s) Performed: ESOPHAGOGASTRODUODENOSCOPY (EGD) WITH PROPOFOL (N/A )  Patient Location: PACU  Anesthesia Type:General  Level of Consciousness: awake, alert  and oriented  Airway & Oxygen Therapy: Patient Spontanous Breathing and Patient connected to nasal cannula oxygen  Post-op Assessment: Report given to RN and Post -op Vital signs reviewed and stable  Post vital signs: Reviewed and stable  Last Vitals:  Vitals Value Taken Time  BP 90/71 12/31/2018  1:51 PM  Temp    Pulse 95 12/31/2018  1:52 PM  Resp 15 12/31/2018  1:52 PM  SpO2 95 % 12/31/2018  1:52 PM  Vitals shown include unvalidated device data.  Last Pain:  Vitals:   12/31/18 1305  TempSrc: Tympanic  PainSc:       Patients Stated Pain Goal: 0 (74/60/02 9847)  Complications: No apparent anesthesia complications

## 2018-12-31 NOTE — Consult Note (Addendum)
PHARMACY CONSULT NOTE - FOLLOW UP  Pharmacy Consult for Electrolyte Monitoring and Replacement   Recent Labs: Potassium (mmol/L)  Date Value  12/31/2018 2.6 (LL)   Magnesium (mg/dL)  Date Value  12/30/2018 1.3 (L)   Calcium (mg/dL)  Date Value  12/31/2018 7.0 (L)   Albumin (g/dL)  Date Value  12/30/2018 3.0 (L)   Sodium (mmol/L)  Date Value  12/31/2018 133 (L)     Assessment: Came in with nausea vomiting and weakness and noted to have hemoglobin of 5.4.  Received 2 units packed RBC transfusion and hemoglobin at 7.4  Pt with hypokalemia (K=2.6) and hypomagnesemia (Mg = 1.3)  Potassium being replenished with oral supplementation and IV fluids (pt not tolerating IV KCl)  Magnesium being replenished with 4g IV Mg Sulfate x 1.  Goal of Therapy:  Electrolytes wnl's  Plan:  Potassium being rechecked and will plan additional supplementation according to lab values.  Will recheck magnesium with tomorrow am labs  Addendum - K improved to 3.4 - will continue to monitor need for IV fluids with potassium with am labs  Lu Duffel, PharmD, BCPS Clinical Pharmacist 12/31/2018 12:04 PM

## 2018-12-31 NOTE — Anesthesia Post-op Follow-up Note (Signed)
Anesthesia QCDR form completed.        

## 2018-12-31 NOTE — Progress Notes (Signed)
Pt unable to tolerate IV potasium, MD Kalisetti notified, Per MD pt can take PO potasium. PO potasium given.

## 2018-12-31 NOTE — Progress Notes (Addendum)
[facility] [place] NAME: Morgan Sanders    MR#:  [id number]  DATE OF BIRTH:  09/06/1975  SUBJECTIVE:  CHIEF COMPLAINT:   Chief Complaint  Patient presents with   Emesis   Loss of Consciousness   -Came in with nausea vomiting and weakness and noted to have hemoglobin of 5.4.  Received 2 units packed RBC transfusion and hemoglobin at 7.4 -Going for EGD today.  Low potassium today  REVIEW OF SYSTEMS:  Review of Systems  Constitutional: Positive for malaise/fatigue. Negative for chills and fever.  HENT: Negative for congestion, ear discharge, hearing loss and nosebleeds.   Eyes: Negative for blurred vision and double vision.  Respiratory: Negative for cough, shortness of breath and wheezing.   Cardiovascular: Negative for chest pain and palpitations.  Gastrointestinal: Positive for abdominal pain and nausea. Negative for constipation, diarrhea and vomiting.  Genitourinary: Negative for dysuria.  Musculoskeletal: Negative for myalgias.  Neurological: Negative for dizziness, focal weakness, seizures, weakness and headaches.  Psychiatric/Behavioral: Negative for depression.    DRUG ALLERGIES:  No Known Allergies  VITALS:  Blood pressure (!) 92/57, pulse 82, temperature 98.2 F (36.8 C), resp. rate 16, height 5\' 5"  (1.651 m), weight 90.3 kg, SpO2 94 %.  PHYSICAL EXAMINATION:  Physical Exam   GENERAL:  43 y.o.-year-old obese  patient lying in the bed with no acute distress.  EYES: Pupils equal, round, reactive to light and accommodation. No scleral icterus. Extraocular muscles intact.  HEENT: Head atraumatic, normocephalic. Oropharynx and nasopharynx clear.  NECK:  Supple, no jugular venous distention. No thyroid enlargement, no tenderness.  LUNGS: Normal breath sounds bilaterally, no wheezing, rales,rhonchi or crepitation. No use of accessory muscles of respiration.  CARDIOVASCULAR: S1, S2 normal. No murmurs, rubs, or gallops.    ABDOMEN: Soft, nontender,  distended. enlarged spleen. Bowel sounds present. No organomegaly or mass.  EXTREMITIES: No pedal edema, cyanosis, or clubbing.  NEUROLOGIC: Cranial nerves II through XII are intact. Muscle strength 5/5 in all extremities. Sensation intact. Gait not checked.  PSYCHIATRIC: The patient is alert and oriented x 3.  SKIN: No obvious rash, lesion, or ulcer.    LABORATORY PANEL:   CBC Recent Labs  Lab 12/31/18 0526  WBC 4.7  HGB 7.4*  HCT 20.9*  PLT 99*   ------------------------------------------------------------------------------------------------------------------  Chemistries  Recent Labs  Lab 12/30/18 1204 12/30/18 1803 12/31/18 0526  NA 131*  --  133*  K 2.4*  --  2.6*  CL 81*  --  93*  CO2 32  --  29  GLUCOSE 122*  --  86  BUN 11  --  13  CREATININE 0.76  --  0.77  CALCIUM 8.2*  --  7.0*  MG  --  1.3*  --   AST 67*  --   --   ALT 18  --   --   ALKPHOS 208*  --   --   BILITOT 3.2*  --   --    ------------------------------------------------------------------------------------------------------------------  Cardiac Enzymes Recent Labs  Lab 12/30/18 1204  TROPONINI <0.03   ------------------------------------------------------------------------------------------------------------------  RADIOLOGY:  Ct Head Wo Contrast  Result Date: 12/30/2018 CLINICAL DATA:  Syncope. Patient reportedly hit head against solid object at the time of syncope. Vomiting. EXAM: CT HEAD WITHOUT CONTRAST TECHNIQUE: Contiguous axial images were obtained from the base of the skull through the vertex without intravenous contrast. COMPARISON:  None. FINDINGS: Brain: The ventricles are normal in size and configuration. There is no intracranial mass,  hemorrhage, extra-axial fluid collection, or midline shift. The brain parenchyma appears unremarkable. No evident acute infarct. Vascular: No hyperdense vessel. There is no appreciable vascular calcification. Skull: The  bony calvarium appears intact. Sinuses/Orbits: Visualized paranasal sinuses are clear. Visualized orbits appear symmetric bilaterally. Other: Mastoid air cells are clear. There is an apparent skin nevus along the left frontal region. IMPRESSION: Study within normal limits. Electronically Signed   By: Lowella Grip III M.D.   On: 12/30/2018 15:42   Ct Abdomen Pelvis W Contrast  Result Date: 12/30/2018 CLINICAL DATA:  Vomiting EXAM: CT ABDOMEN AND PELVIS WITH CONTRAST TECHNIQUE: Multidetector CT imaging of the abdomen and pelvis was performed using the standard protocol following bolus administration of intravenous contrast. CONTRAST:  132mL OMNIPAQUE IOHEXOL 300 MG/ML SOLN, additional oral enteric contrast COMPARISON:  None. FINDINGS: Lower chest: No acute abnormality. Hepatobiliary: Hepatomegaly and hepatic steatosis, maximum coronal span 21 cm. Status post cholecystectomy. No gallbladder wall thickening, or biliary dilatation. Pancreas: Unremarkable. No pancreatic ductal dilatation or surrounding inflammatory changes. Spleen: Splenomegaly, maximum coronal span 15.1 cm. Adrenals/Urinary Tract: Adrenal glands are unremarkable. Kidneys are normal, without renal calculi, focal lesion, or hydronephrosis. Bladder is unremarkable. Stomach/Bowel: Stomach is within normal limits. Appendix appears normal. There is extensive fatty mural stratification of the terminal ileum, cecum, and rectum, with mild, well thickening, fat stranding, and vascular combing about the remaining colon (e.g. series 2, image 46, 70). Vascular/Lymphatic: Mixed calcific atherosclerosis. No enlarged abdominal or pelvic lymph nodes. Reproductive: No mass or other abnormality. Other: No abdominal wall hernia or abnormality. Small volume of fluid in the low abdomen. Musculoskeletal: No acute or significant osseous findings. IMPRESSION: 1. There is extensive fatty mural stratification of the terminal ileum, cecum, and rectum, with mild, well  thickening, fat stranding, and vascular combing about the remaining colon (e.g. series 2, image 46, 70). Findings are consistent with nonspecific infectious, inflammatory, or ischemic colitis, and particularly chronic or recurrent etiology such as inflammatory bowel disease. 2.  Hepatomegaly and hepatic steatosis. 3.  Splenomegaly. 4.  Small volume nonspecific fluid in the low abdomen. Electronically Signed   By: Eddie Candle M.D.   On: 12/30/2018 15:53    EKG:   Orders placed or performed during the hospital encounter of 12/30/18   ED EKG   ED EKG   EKG 12-Lead   EKG 12-Lead    ASSESSMENT AND PLAN:   43 year old female with past medical history significant for anemia, B12 deficiency, intermittent lower abdominal pain presents to hospital secondary to nausea vomiting and loss of energy.  1.  Acute anemia-last hemoglobin from January 2020 was 12, hemoglobin now on admission was 5.4 -Patient received 2 units packed RBC transfusion this admission.  She has been receiving B12 injections as outpatient, once a month currently. -Her iron levels, B12 and ferritin levels are high.  She has hepatomegaly and splenomegaly on CT abdomen -Colonoscopy done as outpatient in December 2019 showing sigmoid diverticulosis and colon and no mass with negative biopsies for colitis. -Patient denies any dark stools, melena or active lower or upper GI bleed. -Going for EGD today.  If negative, consider capsule endoscopy -Her reticulocyte counts are low, ordered Parvovirus B19 antibodies.  Check LDH, hemolysis panel and myeloma panel. -.  Will need  hematology follow-up if GI work-up is negative. Will consult. -Protonix is currently IV-changed to p.o. or discontinue based on EGD results  2.  Hypokalemia and hypomagnesemia-being replaced aggressively.  Patient cannot tolerate IV potassium.  Try to replace by mouth  3.  Hypertension-lisinopril on hold due to low blood pressure.  4.  DVT prophylaxis-teds and  SCDs only  5.  Possible diverticulitis-CT of the abdomen with thickening of the terminal ileum, cecum and rectum-consistent with possible colitis.  Could be chronic etiology.  Biopsies from colonoscopy negative for inflammatory bowel disease.  Given elevated procalcitonin, will start IV Cipro and Flagyl.  Change to oral antibiotics at discharge.     All the records are reviewed and case discussed with Care Management/Social Workerr. Management plans discussed with the patient, family and they are in agreement.  CODE STATUS: Full Code  TOTAL TIME TAKING CARE OF THIS PATIENT: 41 minutes.   POSSIBLE D/C IN 2 DAYS, DEPENDING ON CLINICAL CONDITION.   Gladstone Lighter M.D on 12/31/2018 at 10:05 AM  Between 7am to 6pm - Pager - [phone]  After 6pm go to [email or web address] - password [email or web address] [facility]  Office  [phone]  CC: Primary care physician; Isaias Sakai, DO

## 2018-12-31 NOTE — Consult Note (Addendum)
Hematology/Oncology Consult note [facility] [phone] [phone]  Patient Care Team: Isaias Sakai, DO as PCP - General (Family Medicine)   Name of the patient: Morgan Sanders  [id number]  October 14, 1975    Reason for consult: Normocytic anemia   Requesting physician: Dr. Tressia Miners  Date of visit: 12/31/2018    History of presenting illness-  Patient is a 43 year old female with a past medical history significant for hypertension who was seen by neurology in January 2020 for bilateral lower extremity weakness.  She had an MRI of the thoracic and cervical spine which was essentially unremarkable other than degenerative disease.  She was found to be B12 deficient in January with a level of 217 and was started on B12 supplements.  At that time she was also noted to have a low folic acid of 2.5.  She had a heavy metal screening including arsenic lead and mercury which was unremarkable.  After that she was seen by GI for symptoms of nausea and vomiting.  H&H on 09/09/2018 was 12.2/34 with a normal white count and a normal platelet count.  CMP at that time showed an elevated AST of 84 and a mildly elevated alkaline phosphatase of 125 with a normal bilirubin.  Iron studies showed an elevated ferritin of 515 in December 2019 with a normal iron saturation and low TIBC thyroid functions at that time were normal.  She now presents to the ER with symptoms of intractable nausea and vomiting Her hemoglobin acutely dropped to 7/19.7 with an MCV of 97.5.  No history of GI bleeding.  CT abdomen pelvis with contrast showed extensive fatty mural stratification of the terminal ileum cecum and rectum with findings concerning for nonspecific infectious/inflammatory or ischemic colitis or recurrent etiology such as IBD.  She was also noted to have splenomegaly of 15.1 cm and hepatomegaly spanning 21 cm.  Hematology work-up so far reveals an elevated ferritin of  1348.  B12 levels were elevated at 3177.  TSH was mildly elevated at 4.6.  Her hemoglobin dropped down to 5.4 later during the day and she received blood transfusion and is back up to 7.4 this morning.  She has been ruled out for COVID-19.  Reticulocyte count was very low for the degree of anemia at 0.6.  Coombs test was negative.  She has also had multiple myeloma panel done back in February 2020 which did not reveal any M protein.  Patient currently complains of b/l leg weakness and burning pain in her extremities  ECOG PS- 2  Pain scale- 4   Review of systems- Review of Systems  Constitutional: Positive for malaise/fatigue. Negative for chills, fever and weight loss.  HENT: Negative for congestion, ear discharge and nosebleeds.   Eyes: Negative for blurred vision.  Respiratory: Negative for cough, hemoptysis, sputum production, shortness of breath and wheezing.   Cardiovascular: Negative for chest pain, palpitations, orthopnea and claudication.  Gastrointestinal: Negative for abdominal pain, blood in stool, constipation, diarrhea, heartburn, melena, nausea and vomiting.  Genitourinary: Negative for dysuria, flank pain, frequency, hematuria and urgency.  Musculoskeletal: Negative for back pain, joint pain and myalgias.  Skin: Negative for rash.  Neurological: Positive for sensory change (peripheral neuropathy) and weakness. Negative for dizziness, tingling, focal weakness, seizures and headaches.  Endo/Heme/Allergies: Does not bruise/bleed easily.  Psychiatric/Behavioral: Negative for depression and suicidal ideas. The patient does not have insomnia.     No Known Allergies  Patient Active Problem List   Diagnosis Date Noted  Anemia    Intractable vomiting    GIB (gastrointestinal bleeding) 12/30/2018     History reviewed. No pertinent past medical history.   History reviewed. No pertinent surgical history.  Social History   Socioeconomic History   Marital status:  Married    Spouse name: Not on file   Number of children: Not on file   Years of education: Not on file   Highest education level: Not on file  Occupational History   Not on file  Social Needs   Financial resource strain: Not on file   Food insecurity:    Worry: Not on file    Inability: Not on file   Transportation needs:    Medical: Not on file    Non-medical: Not on file  Tobacco Use   Smoking status: Current Every Day Smoker    Packs/day: 1.00    Types: Cigarettes   Smokeless tobacco: Never Used  Substance and Sexual Activity   Alcohol use: Not Currently   Drug use: Not on file   Sexual activity: Not on file  Lifestyle   Physical activity:    Days per week: Not on file    Minutes per session: Not on file   Stress: Not on file  Relationships   Social connections:    Talks on phone: Not on file    Gets together: Not on file    Attends religious service: Not on file    Active member of club or organization: Not on file    Attends meetings of clubs or organizations: Not on file    Relationship status: Not on file   Intimate partner violence:    Fear of current or ex partner: Not on file    Emotionally abused: Not on file    Physically abused: Not on file    Forced sexual activity: Not on file  Other Topics Concern   Not on file  Social History Narrative   Not on file     Family History  Problem Relation Age of Onset   Breast cancer Maternal Grandmother 65     Current Facility-Administered Medications:    0.9 %  sodium chloride infusion (Manually program via Guardrails IV Fluids), , Intravenous, Once, Salary, Montell D, MD   0.9 %  sodium chloride infusion, 10 mL/hr, Intravenous, Once, Nena Polio, MD   0.9 % NaCl with KCl 40 mEq / L  infusion, , Intravenous, Continuous, Salary, Montell D, MD, Last Rate: 100 mL/hr at 12/31/18 0443, 100 mL/hr at 12/31/18 0443   acetaminophen (TYLENOL) tablet 650 mg, 650 mg, Oral, Q6H PRN **OR**  acetaminophen (TYLENOL) suppository 650 mg, 650 mg, Rectal, Q6H PRN, Salary, Montell D, MD   albuterol (PROVENTIL) (2.5 MG/3ML) 0.083% nebulizer solution 3 mL, 3 mL, Inhalation, Q4H PRN, Salary, Montell D, MD   ciprofloxacin (CIPRO) IVPB 400 mg, 400 mg, Intravenous, Q12H, Gladstone Lighter, MD   HYDROcodone-acetaminophen (NORCO/VICODIN) 5-325 MG per tablet 1-2 tablet, 1-2 tablet, Oral, Q4H PRN, Salary, Montell D, MD, 1 tablet at 12/31/18 0135   metroNIDAZOLE (FLAGYL) IVPB 500 mg, 500 mg, Intravenous, Q8H, Kalisetti, Radhika, MD   morphine 2 MG/ML injection 2 mg, 2 mg, Intravenous, Q2H PRN, Salary, Montell D, MD, 2 mg at 12/30/18 1812   ondansetron (ZOFRAN) tablet 4 mg, 4 mg, Oral, Q6H PRN **OR** ondansetron (ZOFRAN) injection 4 mg, 4 mg, Intravenous, Q6H PRN, Salary, Montell D, MD   pantoprazole (PROTONIX) 80 mg in sodium chloride 0.9 % 250 mL (0.32 mg/mL) infusion, 8  mg/hr, Intravenous, Continuous, Salary, Montell D, MD, Last Rate: 25 mL/hr at 12/30/18 2247, 8 mg/hr at 12/30/18 2247   [START ON 01/03/2019] pantoprazole (PROTONIX) injection 40 mg, 40 mg, Intravenous, Q12H, Salary, Montell D, MD   sodium chloride flush (NS) 0.9 % injection 3 mL, 3 mL, Intravenous, Once, Nance Pear, MD   traZODone (DESYREL) tablet 50 mg, 50 mg, Oral, QHS PRN, Salary, Montell D, MD   vitamin B-12 (CYANOCOBALAMIN) tablet 1,000 mcg, 1,000 mcg, Oral, Daily, Salary, Holly Bodily D, MD   Physical exam:  Vitals:   12/31/18 0129 12/31/18 0339 12/31/18 0435 12/31/18 0821  BP: (!) 97/56 (!) 93/56 (!) 90/55 (!) 92/57  Pulse: 88 82 76 82  Resp: 15 19 18 16   Temp: 98.8 F (37.1 C) 98.5 F (36.9 C) 98.3 F (36.8 C) 98.2 F (36.8 C)  TempSrc: Oral Oral Oral   SpO2: 97% 96% 99% 94%  Weight:      Height:       Physical Exam Constitutional:      Comments: Thin female who appears fatigued. No acute distress  HENT:     Head: Normocephalic and atraumatic.  Eyes:     Pupils: Pupils are equal, round, and  reactive to light.  Neck:     Musculoskeletal: Normal range of motion.  Cardiovascular:     Rate and Rhythm: Normal rate and regular rhythm.     Heart sounds: Normal heart sounds.  Pulmonary:     Effort: Pulmonary effort is normal.     Breath sounds: Normal breath sounds.  Abdominal:     General: Bowel sounds are normal. There is no distension.     Palpations: Abdomen is soft.     Tenderness: There is no abdominal tenderness.     Comments: Liver edge just palpable below right subcostal margihn. No palpable splenomegaly  Lymphadenopathy:     Comments: No palpable cervical, supraclavicular, axillary or inguinal adenopathy   Skin:    General: Skin is warm and dry.  Neurological:     Mental Status: She is alert and oriented to person, place, and time.        CMP Latest Ref Rng & Units 12/31/2018  Glucose 70 - 99 mg/dL -  BUN 6 - 20 mg/dL -  Creatinine 0.44 - 1.00 mg/dL -  Sodium 135 - 145 mmol/L -  Potassium 3.5 - 5.1 mmol/L 3.4(L)  Chloride 98 - 111 mmol/L -  CO2 22 - 32 mmol/L -  Calcium 8.9 - 10.3 mg/dL -  Total Protein 6.5 - 8.1 g/dL -  Total Bilirubin 0.3 - 1.2 mg/dL 2.8(H)  Alkaline Phos 38 - 126 U/L -  AST 15 - 41 U/L -  ALT 0 - 44 U/L -   CBC Latest Ref Rng & Units 12/31/2018  WBC 4.0 - 10.5 K/uL 4.7  Hemoglobin 12.0 - 15.0 g/dL 7.4(L)  Hematocrit 36.0 - 46.0 % 20.9(L)  Platelets 150 - 400 K/uL 99(L)    @IMAGES @  Ct Head Wo Contrast  Result Date: 12/30/2018 CLINICAL DATA:  Syncope. Patient reportedly hit head against solid object at the time of syncope. Vomiting. EXAM: CT HEAD WITHOUT CONTRAST TECHNIQUE: Contiguous axial images were obtained from the base of the skull through the vertex without intravenous contrast. COMPARISON:  None. FINDINGS: Brain: The ventricles are normal in size and configuration. There is no intracranial mass, hemorrhage, extra-axial fluid collection, or midline shift. The brain parenchyma appears unremarkable. No evident acute infarct.  Vascular: No hyperdense vessel. There is no  appreciable vascular calcification. Skull: The bony calvarium appears intact. Sinuses/Orbits: Visualized paranasal sinuses are clear. Visualized orbits appear symmetric bilaterally. Other: Mastoid air cells are clear. There is an apparent skin nevus along the left frontal region. IMPRESSION: Study within normal limits. Electronically Signed   By: Lowella Grip III M.D.   On: 12/30/2018 15:42   Ct Abdomen Pelvis W Contrast  Result Date: 12/30/2018 CLINICAL DATA:  Vomiting EXAM: CT ABDOMEN AND PELVIS WITH CONTRAST TECHNIQUE: Multidetector CT imaging of the abdomen and pelvis was performed using the standard protocol following bolus administration of intravenous contrast. CONTRAST:  169m OMNIPAQUE IOHEXOL 300 MG/ML SOLN, additional oral enteric contrast COMPARISON:  None. FINDINGS: Lower chest: No acute abnormality. Hepatobiliary: Hepatomegaly and hepatic steatosis, maximum coronal span 21 cm. Status post cholecystectomy. No gallbladder wall thickening, or biliary dilatation. Pancreas: Unremarkable. No pancreatic ductal dilatation or surrounding inflammatory changes. Spleen: Splenomegaly, maximum coronal span 15.1 cm. Adrenals/Urinary Tract: Adrenal glands are unremarkable. Kidneys are normal, without renal calculi, focal lesion, or hydronephrosis. Bladder is unremarkable. Stomach/Bowel: Stomach is within normal limits. Appendix appears normal. There is extensive fatty mural stratification of the terminal ileum, cecum, and rectum, with mild, well thickening, fat stranding, and vascular combing about the remaining colon (e.g. series 2, image 46, 70). Vascular/Lymphatic: Mixed calcific atherosclerosis. No enlarged abdominal or pelvic lymph nodes. Reproductive: No mass or other abnormality. Other: No abdominal wall hernia or abnormality. Small volume of fluid in the low abdomen. Musculoskeletal: No acute or significant osseous findings. IMPRESSION: 1. There is extensive  fatty mural stratification of the terminal ileum, cecum, and rectum, with mild, well thickening, fat stranding, and vascular combing about the remaining colon (e.g. series 2, image 46, 70). Findings are consistent with nonspecific infectious, inflammatory, or ischemic colitis, and particularly chronic or recurrent etiology such as inflammatory bowel disease. 2.  Hepatomegaly and hepatic steatosis. 3.  Splenomegaly. 4.  Small volume nonspecific fluid in the low abdomen. Electronically Signed   By: AEddie CandleM.D.   On: 12/30/2018 15:53    Assessment and plan- Patient is a 43y.o. female admitted for intractable nausea and vomiting found to have acute onset normocytic anemia  Patient's hemoglobin was close to normal at 12 back in February 2020 and has acutely dropped down to 7.  There is some features of nonspecific infectious colitis versus possible IBD noted on CT abdomen.  She does not give any overt history of GI bleeding.  Her iron studies presently showed a normal iron level and an extremely elevated ferritin level of greater than thousand indicating some sort of a systemic inflammation.  CRP is also elevated.  However also reticulocyte count is low and so is her LDH and Coombs test is negative which argues against hemolysis.   Although bilirubin is elevated at 3.2-she has both direct and indirect hyperbilirubinemia indicating it is probably from the liver and not due to hemolysis.  Coagulation panel including PT PTT INR and fibrinogen are normal  At this time I would recommend further work-up including getting a smear review, myeloma panel, kappa lambda light chain, CMV and EBV DNA PCR as well as ANA comprehensive panel and celiac disease panel to rule out any underlying autoimmune causes.  I suspect that her anemia is secondary to an underlying systemic process but given the findings of hepatosplenomegaly noted on CT abdomen, I will proceed with a bone marrow biopsy (either inpatient or outpatient) if  there is no obvious cause noted on her peripheral blood work-up which has  been ordered.  Before proceeding with that however I would like to wait for colonoscopy since IBD/ colitis also remains on the differential although patient had unremarkble colonoscopy in dec 2019.  Condition such as [place] can also cause elevated ferritin but the lack of fevers, relatively normal AST and ALT, normal fibrinogen and coagulation panel as well as low LDH argue against it.  Please obtain CT chest to r/o lymphadenopathy  Patient noted to have a low folic acid level and she should be on oral folate 1 mg p.o. daily. She also has severe hypokalemia and will need IV potassium replacement  Will continue to follow   Total face to face encounter time for this patient visit was 40 min. >50% of the time was  spent in counseling and coordination of care and review of outside records and work up      Visit Diagnosis 1. Dehydration   2. Gastrointestinal hemorrhage, unspecified gastrointestinal hemorrhage type   3. Anemia, unspecified type   4. Intractable vomiting with nausea, unspecified vomiting type     Dr. Randa Evens, MD, MPH [facility] at [facility] [phone] 12/31/2018 10:06 PM

## 2018-12-31 NOTE — Progress Notes (Signed)
Spoke with lab.  They said 15 more minutes for Hgb result. Dorna Bloom RN

## 2019-01-01 LAB — BASIC METABOLIC PANEL
Anion gap: 8 (ref 5–15)
BUN: 11 mg/dL (ref 6–20)
CO2: 24 mmol/L (ref 22–32)
Calcium: 7.2 mg/dL — ABNORMAL LOW (ref 8.9–10.3)
Chloride: 103 mmol/L (ref 98–111)
Creatinine, Ser: 0.77 mg/dL (ref 0.44–1.00)
GFR calc Af Amer: >60 mL/min (ref >60)
GFR calc non Af Amer: >60 mL/min (ref >60)
Glucose, Bld: 85 mg/dL (ref 70–99)
Potassium: 3.7 mmol/L (ref 3.5–5.1)
Sodium: 135 mmol/L (ref 135–145)

## 2019-01-01 LAB — CBC
HCT: 23.2 % — ABNORMAL LOW (ref 36.0–46.0)
Hemoglobin: 7.9 g/dL — ABNORMAL LOW (ref 12.0–15.0)
MCH: 32.8 pg (ref 26.0–34.0)
MCHC: 34.1 g/dL (ref 30.0–36.0)
MCV: 96.3 fL (ref 80.0–100.0)
Platelets: 102 10*3/uL — ABNORMAL LOW (ref 150–400)
RBC: 2.41 MIL/uL — ABNORMAL LOW (ref 3.87–5.11)
RDW: 19.4 % — ABNORMAL HIGH (ref 11.5–15.5)
WBC: 4.5 10*3/uL (ref 4.0–10.5)
nRBC: 0 % (ref 0.0–0.2)

## 2019-01-01 LAB — MAGNESIUM: Magnesium: 2.1 mg/dL (ref 1.7–2.4)

## 2019-01-01 LAB — HAPTOGLOBIN: Haptoglobin: 135 mg/dL (ref 42–296)

## 2019-01-01 LAB — TRIGLYCERIDES: Triglycerides: 99 mg/dL (ref <150)

## 2019-01-01 LAB — GAMMA GT: GGT: 159 U/L — ABNORMAL HIGH (ref 7–50)

## 2019-01-01 MED ORDER — PANTOPRAZOLE SODIUM 40 MG PO TBEC: 40.0000 mg | DELAYED_RELEASE_TABLET | Freq: Two times a day (BID) | ORAL | 0 refills | Status: DC

## 2019-01-01 MED ORDER — METRONIDAZOLE 500 MG PO TABS: 500.0000 mg | ORAL_TABLET | Freq: Three times a day (TID) | ORAL | 0 refills | Status: DC

## 2019-01-01 MED ORDER — CIPROFLOXACIN HCL 500 MG PO TABS: 500.0000 mg | ORAL_TABLET | Freq: Two times a day (BID) | ORAL | 0 refills | Status: DC

## 2019-01-01 NOTE — Consult Note (Addendum)
PHARMACY CONSULT NOTE - FOLLOW UP  Pharmacy Consult for Electrolyte Monitoring and Replacement   Recent Labs: Potassium (mmol/L)  Date Value  01/01/2019 3.7   Magnesium (mg/dL)  Date Value  01/01/2019 2.1   Calcium (mg/dL)  Date Value  01/01/2019 7.2 (L)   Albumin (g/dL)  Date Value  12/30/2018 3.0 (L)   Sodium (mmol/L)  Date Value  01/01/2019 135     Assessment: Came in with nausea vomiting and weakness and noted to have hemoglobin of 5.4.  Received 2 units packed RBC transfusion and hemoglobin at 7.4. Pharmacy consulted for electrolyte monitoring and replacement.    Goal of Therapy:  Electrolytes wnl's  Plan:  5/16 Electrolytes WNL this morning. No supplementation needed at this time. Pt is on 0.9%NS w/ KCl 40 mEq infusion.  Will recheck electrolytes tomorrow am labs and continue to replace as needed.    Eleonore Chiquito, PharmD, BCPS Clinical Pharmacist 01/01/2019 5:51 AM

## 2019-01-01 NOTE — Discharge Summary (Signed)
[facility] NAME: Morgan Sanders    MR#:  [id number]  DATE OF BIRTH:  1976/03/06  DATE OF ADMISSION:  12/30/2018   ADMITTING PHYSICIAN: Gorden Harms, MD  DATE OF DISCHARGE: 01/01/2019.  PRIMARY CARE PHYSICIAN: Isaias Sakai, DO   ADMISSION DIAGNOSIS:  Dehydration [E86.0] Gastrointestinal hemorrhage, unspecified gastrointestinal hemorrhage type [K92.2] Anemia, unspecified type [D64.9] Intractable vomiting with nausea, unspecified vomiting type [R11.2] DISCHARGE DIAGNOSIS:  Active Problems:   GIB (gastrointestinal bleeding)   Anemia   Intractable vomiting   Acute peptic ulcer of stomach  SECONDARY DIAGNOSIS:  History reviewed. No pertinent past medical history. HOSPITAL COURSE:  Chief complaint Nausea and vomiting  HISTORY OF PRESENT ILLNESS: Morgan Sanders  is a 43 y.o. female with a known history of chronic abdominal pain-followed by Dr. Alice Reichert, had colonoscopy done in December of last year noted for polyps/diverticulosis, vitamin D deficiency, vitamin B12 deficiency, presents emergency room with 10-day history of intractable nausea/emesis, inability to tolerate p.o. intake, lightheadedness, dizziness, generalized weakness, fatigue, had sustained a fall as well without hitting her head, and emergency room patient was found to be tachycardic, hemoglobin 7, sodium 131, potassium 2.4, CT abdomen noted for inflammatory bowel disease versus infection/hepatomegaly/hepatic steatosis/splenomegaly, guaiac positive, patient valuated in the emergency room, patient in no apparent distress, resting comfortably in bed, patient states that her abdominal pain is similar to her press episode since October of last year-feels no different, patient  was admitted for acute on chronic abdominal pain, acute GI bleeding with orthostasis, severe hypokalemia, please refer to the H&P for further details.   Hospital course; 1.  Acute  anemia-last hemoglobin from January 2020 was 12, hemoglobin now on admission was 5.4 Patient received 2 units packed RBC transfusion this admission.  Hemoglobin improved to 7.9 prior to discharge from the hospital. She has been receiving B12 injections as outpatient, once a month currently. Her iron levels, B12 and ferritin levels are high.  She has hepatomegaly and splenomegaly on CT abdomen Colonoscopy done as outpatient in December 2019 showing sigmoid diverticulosis and colon and no mass with negative biopsies for colitis.-Patient denies any dark stools, melena or active lower or upper GI bleed.  Patient had EGD done during this admission which revealed nonbleeding gastric ulcers.  Patient was treated with intravenous Protonix.  Being discharged on Protonix 40 mg p.o. twice daily.  Gastroenterologist Dr. Allen Norris recommended colonoscopy but patient declines colonoscopy at this time given that she had one done in December 2019.  She will follow-up with her gastroenterologist Dr. Alice Reichert for further work-up as outpatient. Patient was also seen by hematologist Dr. Janese Banks who plans to do extensive further work-up as outpatient since patient is requesting for discharge home today.  Hemoglobin remained stable.  No further bleeding.  2.  Hypokalemia and hypomagnesemia-replaced prior to discharge   3.  Hypertension-lisinopril on hold due blood pressure being borderline.  This may be resumed by primary care physician   4.  Possible diverticulitis-CT of the abdomen with thickening of the terminal ileum, cecum and rectum-consistent with possible colitis.  Could be chronic etiology.  Biopsies from colonoscopy negative for inflammatory bowel disease.  Given elevated procalcitonin, patient was treated with Cipro and Flagyl.  Prescription for 5 more days given until patient follows up with primary care physician.  Remains afebrile.  No more nausea vomiting or abdominal pains at this time.    DISCHARGE CONDITIONS:   Stable CONSULTS OBTAINED:  Treatment Team:  Lucilla Lame, MD Sindy Guadeloupe, MD DRUG ALLERGIES:  No Known Allergies DISCHARGE MEDICATIONS:   Allergies as of 01/01/2019   No Known Allergies     Medication List    STOP taking these medications   lisinopril 5 MG tablet Commonly known as:  ZESTRIL     TAKE these medications   albuterol 108 (90 Base) MCG/ACT inhaler Commonly known as:  VENTOLIN HFA Inhale 2 puffs into the lungs every 4 (four) hours as needed for shortness of breath or wheezing.   ciprofloxacin 500 MG tablet Commonly known as:  CIPRO Take 1 tablet (500 mg total) by mouth every 12 (twelve) hours.   metroNIDAZOLE 500 MG tablet Commonly known as:  FLAGYL Take 1 tablet (500 mg total) by mouth every 8 (eight) hours.   pantoprazole 40 MG tablet Commonly known as:  Protonix Take 1 tablet (40 mg total) by mouth 2 (two) times daily.   traMADol 50 MG tablet Commonly known as:  ULTRAM Take 50 mg by mouth 2 (two) times daily as needed.   vitamin B-12 1000 MCG tablet Commonly known as:  CYANOCOBALAMIN Take 1,000 mcg by mouth daily.        DISCHARGE INSTRUCTIONS:   DIET:  Advance to full liquid gradually advance to regular consistency diet as tolerated. DISCHARGE CONDITION:  Stable ACTIVITY:  Activity as tolerated OXYGEN:  Home Oxygen: No.  Oxygen Delivery: room air DISCHARGE LOCATION:  home   If you experience worsening of your admission symptoms, develop shortness of breath, life threatening emergency, suicidal or homicidal thoughts you must seek medical attention immediately by calling 911 or calling your MD immediately  if symptoms less severe.  You Must read complete instructions/literature along with all the possible adverse reactions/side effects for all the Medicines you take and that have been prescribed to you. Take any new Medicines after you have completely understood and accpet all the possible adverse reactions/side effects.   Please note   You were cared for by a hospitalist during your hospital stay. If you have any questions about your discharge medications or the care you received while you were in the hospital after you are discharged, you can call the unit and asked to speak with the hospitalist on call if the hospitalist that took care of you is not available. Once you are discharged, your primary care physician will handle any further medical issues. Please note that NO REFILLS for any discharge medications will be authorized once you are discharged, as it is imperative that you return to your primary care physician (or establish a relationship with a primary care physician if you do not have one) for your aftercare needs so that they can reassess your need for medications and monitor your lab values.    On the day of Discharge:  VITAL SIGNS:  Blood pressure 110/74, pulse 76, temperature 97.9 F (36.6 C), temperature source Oral, resp. rate 18, height 5\' 5"  (1.651 m), weight 90.3 kg, SpO2 100 %. PHYSICAL EXAMINATION:  GENERAL:  43 y.o.-year-old patient lying in the bed with no acute distress.  EYES: Pupils equal, round, reactive to light and accommodation. No scleral icterus. Extraocular muscles intact.  HEENT: Head atraumatic, normocephalic. Oropharynx and nasopharynx clear.  NECK:  Supple, no jugular venous distention. No thyroid enlargement, no tenderness.  LUNGS: Normal breath sounds bilaterally, no wheezing, rales,rhonchi or crepitation. No use of accessory muscles of respiration.  CARDIOVASCULAR: S1, S2 normal. No murmurs, rubs, or gallops.  ABDOMEN: Soft, non-tender, non-distended. Bowel sounds present.  No organomegaly or mass.  EXTREMITIES: No pedal edema, cyanosis, or clubbing.  NEUROLOGIC: Cranial nerves II through XII are intact. Muscle strength 5/5 in all extremities. Sensation intact. Gait not checked.  PSYCHIATRIC: The patient is alert and oriented x 3.  SKIN: No obvious rash, lesion, or ulcer.  DATA REVIEW:    CBC Recent Labs  Lab 01/01/19 0433  WBC 4.5  HGB 7.9*  HCT 23.2*  PLT 102*    Chemistries  Recent Labs  Lab 12/30/18 1204  12/31/18 1121 01/01/19 0433  NA 131*   < >  --  135  K 2.4*   < > 3.4* 3.7  CL 81*   < >  --  103  CO2 32   < >  --  24  GLUCOSE 122*   < >  --  85  BUN 11   < >  --  11  CREATININE 0.76   < >  --  0.77  CALCIUM 8.2*   < >  --  7.2*  MG  --    < >  --  2.1  AST 67*  --   --   --   ALT 18  --   --   --   ALKPHOS 208*  --   --   --   BILITOT 3.2*  --  2.8*  --    < > = values in this interval not displayed.     Microbiology Results  Results for orders placed or performed during the hospital encounter of 12/30/18  SARS Coronavirus 2 [facility] order, Performed in [facility] hospital lab)     Status: None   Collection Time: 12/30/18  4:18 PM  Result Value Ref Range Status   SARS Coronavirus 2 NEGATIVE NEGATIVE Final    Comment: (NOTE) If result is NEGATIVE SARS-CoV-2 target nucleic acids are NOT DETECTED. The SARS-CoV-2 RNA is generally detectable in upper and lower  respiratory specimens during the acute phase of infection. The lowest  concentration of SARS-CoV-2 viral copies this assay can detect is 250  copies / mL. A negative result does not preclude SARS-CoV-2 infection  and should not be used as the sole basis for treatment or other  patient management decisions.  A negative result may occur with  improper specimen collection / handling, submission of specimen other  than nasopharyngeal swab, presence of viral mutation(s) within the  areas targeted by this assay, and inadequate number of viral copies  (<250 copies / mL). A negative result must be combined with clinical  observations, patient history, and epidemiological information. If result is POSITIVE SARS-CoV-2 target nucleic acids are DETECTED. The SARS-CoV-2 RNA is generally detectable in upper and lower  respiratory specimens dur ing the acute phase of infection.  Positive   results are indicative of active infection with SARS-CoV-2.  Clinical  correlation with patient history and other diagnostic information is  necessary to determine patient infection status.  Positive results do  not rule out bacterial infection or co-infection with other viruses. If result is PRESUMPTIVE POSTIVE SARS-CoV-2 nucleic acids MAY BE PRESENT.   A presumptive positive result was obtained on the submitted specimen  and confirmed on repeat testing.  While 2019 novel coronavirus  (SARS-CoV-2) nucleic acids may be present in the submitted sample  additional confirmatory testing may be necessary for epidemiological  and / or clinical management purposes  to differentiate between  SARS-CoV-2 and other Sarbecovirus currently known to infect humans.  If clinically indicated additional  testing with an alternate test  methodology [phone]) is advised. The SARS-CoV-2 RNA is generally  detectable in upper and lower respiratory sp ecimens during the acute  phase of infection. The expected result is Negative. Fact Sheet for Patients:  StrictlyIdeas.no Fact Sheet for Healthcare Providers: BankingDealers.co.za This test is not yet approved or cleared by the [place] FDA and has been authorized for detection and/or diagnosis of SARS-CoV-2 by FDA under an Emergency Use Authorization (EUA).  This EUA will remain in effect (meaning this test can be used) for the duration of the COVID-19 declaration under Section 564(b)(1) of the Act, 21 U.S.C. section 360bbb-3(b)(1), unless the authorization is terminated or revoked sooner. Performed at [facility], [address] [place], [place] [address]     RADIOLOGY:  Ct Chest Wo Contrast  Result Date: 12/31/2018 CLINICAL DATA:  Patient admitted with nausea vomiting and weakness 12/31/2018. Shortness of breath. Anemia. Question malignancy. EXAM: CT CHEST WITHOUT CONTRAST TECHNIQUE:  Multidetector CT imaging of the chest was performed following the standard protocol without IV contrast. COMPARISON:  PA and lateral chest 02/13/2005. FINDINGS: Cardiovascular: Calcific aortic and coronary atherosclerosis is identified. Heart size is normal. No pericardial effusion. Mediastinum/Nodes: No enlarged mediastinal or axillary lymph nodes. Thyroid gland, trachea, and esophagus demonstrate no significant findings. Lungs/Pleura: No pleural effusion.  Lungs are clear. Upper Abdomen: See report of dedicated abdomen and pelvis CT scan this same day. Musculoskeletal: Negative. No fracture. No lytic or sclerotic lesion. IMPRESSION: No acute disease.  Negative for neoplasm. Aortic Atherosclerosis (ICD10-I70.0). Electronically Signed   By: Inge Rise M.D.   On: 12/31/2018 20:43     Management plans discussed with the patient, family and they are in agreement.  CODE STATUS: Full Code   TOTAL TIME TAKING CARE OF THIS PATIENT: 38 minutes.    Annet Manukyan M.D on 01/01/2019 at 11:44 AM  Between 7am to 6pm - Pager - [phone]  After 6pm go to [email or web address] - password [email or web address] [facility]  [facility]  Office  [phone]  CC: Primary care physician; Isaias Sakai, DO   Note: This dictation was prepared with Dragon dictation along with smaller phrase technology. Any transcriptional errors that result from this process are unintentional.

## 2019-01-01 NOTE — Progress Notes (Signed)
Patient is being discharged to home with husband this afternoon. DC & Rx instructions given and patient acknowledged understanding. Belongings packed. IVs removed.

## 2019-01-02 LAB — TYPE AND SCREEN
ABO/RH(D): A POS
Antibody Screen: NEGATIVE
Unit division: 0
Unit division: 0
Unit division: 0
Unit division: 0

## 2019-01-02 LAB — BPAM RBC
Blood Product Expiration Date: 202005182359
Blood Product Expiration Date: 202005302359
Blood Product Expiration Date: 202006062359
Blood Product Expiration Date: 202006062359
ISSUE DATE / TIME: 202005141843
ISSUE DATE / TIME: 202005150111
Unit Type and Rh: 6200
Unit Type and Rh: 6200
Unit Type and Rh: 6200
Unit Type and Rh: 6200

## 2019-01-02 LAB — PREPARE RBC (CROSSMATCH)

## 2019-01-03 ENCOUNTER — Other Ambulatory Visit: Payer: Self-pay | Admitting: *Deleted

## 2019-01-03 ENCOUNTER — Encounter: Payer: Self-pay | Admitting: Gastroenterology

## 2019-01-03 DIAGNOSIS — D649 Anemia, unspecified: Secondary | ICD-10-CM

## 2019-01-03 DIAGNOSIS — R161 Splenomegaly, not elsewhere classified: Secondary | ICD-10-CM

## 2019-01-03 DIAGNOSIS — R160 Hepatomegaly, not elsewhere classified: Secondary | ICD-10-CM

## 2019-01-03 LAB — KAPPA/LAMBDA LIGHT CHAINS
Kappa free light chain: 37.6 mg/L — ABNORMAL HIGH (ref 3.3–19.4)
Kappa, lambda light chain ratio: 0.74 (ref 0.26–1.65)
Lambda free light chains: 51 mg/L — ABNORMAL HIGH (ref 5.7–26.3)

## 2019-01-03 LAB — MULTIPLE MYELOMA PANEL, SERUM
Albumin SerPl Elph-Mcnc: 2.5 g/dL — ABNORMAL LOW (ref 2.9–4.4)
Albumin/Glob SerPl: 1 (ref 0.7–1.7)
Alpha 1: 0.2 g/dL (ref 0.0–0.4)
Alpha2 Glob SerPl Elph-Mcnc: 0.7 g/dL (ref 0.4–1.0)
B-Globulin SerPl Elph-Mcnc: 0.6 g/dL — ABNORMAL LOW (ref 0.7–1.3)
Gamma Glob SerPl Elph-Mcnc: 1.1 g/dL (ref 0.4–1.8)
Globulin, Total: 2.6 g/dL (ref 2.2–3.9)
IgA: 338 mg/dL (ref 87–352)
IgG (Immunoglobin G), Serum: 1103 mg/dL (ref 586–1602)
IgM (Immunoglobulin M), Srm: 115 mg/dL (ref 26–217)
Total Protein ELP: 5.1 g/dL — ABNORMAL LOW (ref 6.0–8.5)

## 2019-01-03 LAB — PATHOLOGIST SMEAR REVIEW

## 2019-01-04 ENCOUNTER — Encounter: Payer: Self-pay | Admitting: Gastroenterology

## 2019-01-04 LAB — ANA COMPREHENSIVE PANEL

## 2019-01-04 LAB — PARVOVIRUS B19 ANTIBODY, IGG AND IGM
Parovirus B19 IgG Abs: 7.4 index — ABNORMAL HIGH (ref 0.0–0.8)
Parovirus B19 IgM Abs: 0.2 index (ref 0.0–0.8)

## 2019-01-05 LAB — EPSTEIN BARR VRS(EBV DNA BY PCR)
EBV DNA QN by PCR: NEGATIVE copies/mL
log10 EBV DNA Qn PCR: UNDETERMINED log10 copy/mL

## 2019-01-05 LAB — CELIAC DISEASE PANEL
Endomysial Ab, IgA: NEGATIVE
IgA: 339 mg/dL (ref 87–352)
Tissue Transglutaminase Ab, IgA: <2 U/mL (ref 0–3)

## 2019-01-05 LAB — SURGICAL PATHOLOGY

## 2019-01-07 ENCOUNTER — Ambulatory Visit
Admission: RE | Admit: 2019-01-07 | Discharge: 2019-01-07 | Disposition: A | Discharge status: Home / Self Care | Payer: 59 | Source: Ambulatory Visit | Attending: Oncology | Admitting: Oncology

## 2019-01-07 ENCOUNTER — Other Ambulatory Visit (HOSPITAL_COMMUNITY)
Admission: RE | Admit: 2019-01-07 | Discharge: 2019-01-07 | Disposition: A | Discharge status: Home / Self Care | Payer: 59 | Source: Ambulatory Visit | Attending: Oncology | Admitting: Oncology

## 2019-01-07 ENCOUNTER — Other Ambulatory Visit: Payer: Self-pay

## 2019-01-07 DIAGNOSIS — R161 Splenomegaly, not elsewhere classified: Secondary | ICD-10-CM

## 2019-01-07 DIAGNOSIS — R160 Hepatomegaly, not elsewhere classified: Secondary | ICD-10-CM

## 2019-01-07 DIAGNOSIS — D649 Anemia, unspecified: Secondary | ICD-10-CM | POA: Diagnosis present

## 2019-01-07 DIAGNOSIS — R162 Hepatomegaly with splenomegaly, not elsewhere classified: Secondary | ICD-10-CM | POA: Insufficient documentation

## 2019-01-07 DIAGNOSIS — Z7901 Long term (current) use of anticoagulants: Secondary | ICD-10-CM | POA: Diagnosis not present

## 2019-01-07 DIAGNOSIS — Z79899 Other long term (current) drug therapy: Secondary | ICD-10-CM | POA: Diagnosis not present

## 2019-01-07 DIAGNOSIS — Z792 Long term (current) use of antibiotics: Secondary | ICD-10-CM | POA: Insufficient documentation

## 2019-01-07 DIAGNOSIS — F17210 Nicotine dependence, cigarettes, uncomplicated: Secondary | ICD-10-CM | POA: Insufficient documentation

## 2019-01-07 DIAGNOSIS — Z803 Family history of malignant neoplasm of breast: Secondary | ICD-10-CM | POA: Diagnosis not present

## 2019-01-07 DIAGNOSIS — Z9049 Acquired absence of other specified parts of digestive tract: Secondary | ICD-10-CM | POA: Insufficient documentation

## 2019-01-07 HISTORY — DX: Anemia, unspecified: D64.9

## 2019-01-07 LAB — COMPREHENSIVE METABOLIC PANEL
ALT: 10 U/L (ref 0–44)
AST: 31 U/L (ref 15–41)
Albumin: 2.9 g/dL — ABNORMAL LOW (ref 3.5–5.0)
Alkaline Phosphatase: 128 U/L — ABNORMAL HIGH (ref 38–126)
Anion gap: 10 (ref 5–15)
BUN: 7 mg/dL (ref 6–20)
CO2: 23 mmol/L (ref 22–32)
Calcium: 8.2 mg/dL — ABNORMAL LOW (ref 8.9–10.3)
Chloride: 106 mmol/L (ref 98–111)
Creatinine, Ser: 0.87 mg/dL (ref 0.44–1.00)
GFR calc Af Amer: >60 mL/min (ref >60)
GFR calc non Af Amer: >60 mL/min (ref >60)
Glucose, Bld: 94 mg/dL (ref 70–99)
Potassium: 2.8 mmol/L — ABNORMAL LOW (ref 3.5–5.1)
Sodium: 139 mmol/L (ref 135–145)
Total Bilirubin: 1.2 mg/dL (ref 0.3–1.2)
Total Protein: 6.2 g/dL — ABNORMAL LOW (ref 6.5–8.1)

## 2019-01-07 LAB — CBC WITH DIFFERENTIAL/PLATELET
Abs Immature Granulocytes: 0.02 10*3/uL (ref 0.00–0.07)
Basophils Absolute: 0 10*3/uL (ref 0.0–0.1)
Basophils Relative: 1 %
Eosinophils Absolute: 0 10*3/uL (ref 0.0–0.5)
Eosinophils Relative: 1 %
HCT: 22.9 % — ABNORMAL LOW (ref 36.0–46.0)
Hemoglobin: 7.6 g/dL — ABNORMAL LOW (ref 12.0–15.0)
Immature Granulocytes: 1 %
Lymphocytes Relative: 25 %
Lymphs Abs: 1 10*3/uL (ref 0.7–4.0)
MCH: 32.5 pg (ref 26.0–34.0)
MCHC: 33.2 g/dL (ref 30.0–36.0)
MCV: 97.9 fL (ref 80.0–100.0)
Monocytes Absolute: 0.3 10*3/uL (ref 0.1–1.0)
Monocytes Relative: 8 %
Neutro Abs: 2.6 10*3/uL (ref 1.7–7.7)
Neutrophils Relative %: 64 %
Platelets: 153 10*3/uL (ref 150–400)
RBC: 2.34 MIL/uL — ABNORMAL LOW (ref 3.87–5.11)
RDW: 17.4 % — ABNORMAL HIGH (ref 11.5–15.5)
WBC: 3.9 10*3/uL — ABNORMAL LOW (ref 4.0–10.5)
nRBC: 0 % (ref 0.0–0.2)

## 2019-01-07 LAB — PROTIME-INR
INR: 1 (ref 0.8–1.2)
Prothrombin Time: 13.2 seconds (ref 11.4–15.2)

## 2019-01-07 LAB — APTT: aPTT: 30 seconds (ref 24–36)

## 2019-01-07 MED ORDER — HEPARIN SOD (PORK) LOCK FLUSH 100 UNIT/ML IV SOLN
INTRAVENOUS | Status: AC
  Filled 2019-01-07: qty 5

## 2019-01-07 MED ORDER — FENTANYL CITRATE (PF) 100 MCG/2ML IJ SOLN
INTRAMUSCULAR | Status: AC | PRN
  Administered 2019-01-07 (×3): 25 ug via INTRAVENOUS
  Administered 2019-01-07: 50 ug via INTRAVENOUS
  Administered 2019-01-07 (×2): 25 ug via INTRAVENOUS

## 2019-01-07 MED ORDER — MIDAZOLAM HCL 2 MG/2ML IJ SOLN
INTRAMUSCULAR | Status: AC
  Filled 2019-01-07: qty 4

## 2019-01-07 MED ORDER — FENTANYL CITRATE (PF) 100 MCG/2ML IJ SOLN
INTRAMUSCULAR | Status: AC
  Filled 2019-01-07: qty 4

## 2019-01-07 MED ORDER — SODIUM CHLORIDE 0.9 % IV SOLN
INTRAVENOUS | Status: DC
  Administered 2019-01-07: 09:00:00 via INTRAVENOUS

## 2019-01-07 MED ORDER — MIDAZOLAM HCL 2 MG/2ML IJ SOLN
INTRAMUSCULAR | Status: AC | PRN
  Administered 2019-01-07 (×4): 1 mg via INTRAVENOUS

## 2019-01-07 MED ORDER — LORAZEPAM 2 MG/ML IJ SOLN
INTRAMUSCULAR | Status: AC | PRN
  Administered 2019-01-07: 1 mg via INTRAVENOUS

## 2019-01-07 NOTE — Procedures (Signed)
Pre-procedure Diagnosis: Anemia and hepatosplenomegaly of uncertain etiology. Post-procedure Diagnosis: Same  Technically successful CT guided bone marrow aspiration and biopsy of left iliac crest.   Complications: None Immediate  EBL: None  Signed: Sandi Mariscal Pager: [phone] 01/07/2019, 9:29 AM

## 2019-01-07 NOTE — Consult Note (Signed)
Chief Complaint: Anemia, hepatosplenomegaly  Referring Physician(s): Rao,Archana C  Patient Status: [facility] - Out-pt  History of Present Illness: Morgan Sanders is a 43 y.o. female with no significant past medical history who presents today for CT-guided bone marrow biopsy for the work-up of anemia and hepatosplenomegaly of uncertain etiology at the request of Dr. Janese Banks.  The patient is unaccompanied and serves as her own historian  Patient is currently without complaint.  Specifically, no chest pain, cough, fever or chills.    Past Medical History:  Diagnosis Date   Anemia     Past Surgical History:  Procedure Laterality Date   CHOLECYSTECTOMY     ESOPHAGOGASTRODUODENOSCOPY (EGD) WITH PROPOFOL N/A 12/31/2018   Procedure: ESOPHAGOGASTRODUODENOSCOPY (EGD) WITH PROPOFOL;  Surgeon: Lucilla Lame, MD;  Location: [facility] ENDOSCOPY;  Service: Endoscopy;  Laterality: N/A;   TUBAL LIGATION      Allergies: Patient has no known allergies.  Medications: Prior to Admission medications   Medication Sig Start Date End Date Taking? Authorizing Provider  cholecalciferol (VITAMIN D3) 25 MCG (1000 UT) tablet Take 1,000 Units by mouth daily.   Yes [provider]  ciprofloxacin (CIPRO) 500 MG tablet Take 1 tablet (500 mg total) by mouth every 12 (twelve) hours. 01/01/19  Yes Ojie, Jude, MD  escitalopram (LEXAPRO) 10 MG tablet Take 10 mg by mouth daily.   Yes [provider]  metroNIDAZOLE (FLAGYL) 500 MG tablet Take 1 tablet (500 mg total) by mouth every 8 (eight) hours. 01/01/19  Yes Ojie, Jude, MD  pantoprazole (PROTONIX) 40 MG tablet Take 1 tablet (40 mg total) by mouth 2 (two) times daily. 01/01/19 01/01/20 Yes Ojie, Jude, MD  traMADol (ULTRAM) 50 MG tablet Take 50 mg by mouth 2 (two) times daily as needed. 11/03/18  Yes [provider]  vitamin B-12 (CYANOCOBALAMIN) 1000 MCG tablet Take 1,000 mcg by mouth daily.   Yes [provider]  albuterol (VENTOLIN  HFA) 108 (90 Base) MCG/ACT inhaler Inhale 2 puffs into the lungs every 4 (four) hours as needed for shortness of breath or wheezing. 11/08/18   [provider]     Family History  Problem Relation Age of Onset   Breast cancer Maternal Grandmother 78    Social History   Socioeconomic History   Marital status: Married    Spouse name: [name]   Number of children: 2   Years of education: Not on file   Highest education level: Not on file  Occupational History   Not on file  Social Needs   Financial resource strain: Not hard at all   Food insecurity:    Worry: Never true    Inability: Never true   Transportation needs:    Medical: No    Non-medical: No  Tobacco Use   Smoking status: Current Every Day Smoker    Packs/day: 1.00    Types: Cigarettes   Smokeless tobacco: Never Used  Substance and Sexual Activity   Alcohol use: Not Currently   Drug use: Never   Sexual activity: Not on file  Lifestyle   Physical activity:    Days per week: 0 days    Minutes per session: 0 min   Stress: Not at all  Relationships   Social connections:    Talks on phone: More than three times a week    Gets together: More than three times a week    Attends religious service: More than 4 times per year    Active member of club or  organization: Not on file    Attends meetings of clubs or organizations: Not on file    Relationship status: Not on file  Other Topics Concern   Not on file  Social History Narrative   Not on file    ECOG Status: 0 - Asymptomatic  Review of Systems: A 12 point ROS discussed and pertinent positives are indicated in the HPI above.  All other systems are negative.  Review of Systems  Constitutional: Negative for activity change, fatigue and fever.    Vital Signs: BP 99/73    Pulse 79    Temp 97.9 F (36.6 C) (Oral)    Resp 18    SpO2 100%   Physical Exam Vitals signs and nursing note reviewed. Exam conducted with a chaperone  present.  Constitutional:      Comments: Appears older than stated age  Cardiovascular:     Rate and Rhythm: Normal rate and regular rhythm.  Pulmonary:     Effort: Pulmonary effort is normal.     Breath sounds: Normal breath sounds.  Neurological:     Mental Status: She is alert.  Psychiatric:        Mood and Affect: Mood normal.        Behavior: Behavior normal.        Thought Content: Thought content normal.     Imaging: Ct Head Wo Contrast  Result Date: 12/30/2018 CLINICAL DATA:  Syncope. Patient reportedly hit head against solid object at the time of syncope. Vomiting. EXAM: CT HEAD WITHOUT CONTRAST TECHNIQUE: Contiguous axial images were obtained from the base of the skull through the vertex without intravenous contrast. COMPARISON:  None. FINDINGS: Brain: The ventricles are normal in size and configuration. There is no intracranial mass, hemorrhage, extra-axial fluid collection, or midline shift. The brain parenchyma appears unremarkable. No evident acute infarct. Vascular: No hyperdense vessel. There is no appreciable vascular calcification. Skull: The bony calvarium appears intact. Sinuses/Orbits: Visualized paranasal sinuses are clear. Visualized orbits appear symmetric bilaterally. Other: Mastoid air cells are clear. There is an apparent skin nevus along the left frontal region. IMPRESSION: Study within normal limits. Electronically Signed   By: Lowella Grip III M.D.   On: 12/30/2018 15:42   Ct Chest Wo Contrast  Result Date: 12/31/2018 CLINICAL DATA:  Patient admitted with nausea vomiting and weakness 12/31/2018. Shortness of breath. Anemia. Question malignancy. EXAM: CT CHEST WITHOUT CONTRAST TECHNIQUE: Multidetector CT imaging of the chest was performed following the standard protocol without IV contrast. COMPARISON:  PA and lateral chest 02/13/2005. FINDINGS: Cardiovascular: Calcific aortic and coronary atherosclerosis is identified. Heart size is normal. No pericardial  effusion. Mediastinum/Nodes: No enlarged mediastinal or axillary lymph nodes. Thyroid gland, trachea, and esophagus demonstrate no significant findings. Lungs/Pleura: No pleural effusion.  Lungs are clear. Upper Abdomen: See report of dedicated abdomen and pelvis CT scan this same day. Musculoskeletal: Negative. No fracture. No lytic or sclerotic lesion. IMPRESSION: No acute disease.  Negative for neoplasm. Aortic Atherosclerosis (ICD10-I70.0). Electronically Signed   By: Inge Rise M.D.   On: 12/31/2018 20:43   Ct Abdomen Pelvis W Contrast  Result Date: 12/30/2018 CLINICAL DATA:  Vomiting EXAM: CT ABDOMEN AND PELVIS WITH CONTRAST TECHNIQUE: Multidetector CT imaging of the abdomen and pelvis was performed using the standard protocol following bolus administration of intravenous contrast. CONTRAST:  158m OMNIPAQUE IOHEXOL 300 MG/ML SOLN, additional oral enteric contrast COMPARISON:  None. FINDINGS: Lower chest: No acute abnormality. Hepatobiliary: Hepatomegaly and hepatic steatosis, maximum coronal span 21 cm. Status  post cholecystectomy. No gallbladder wall thickening, or biliary dilatation. Pancreas: Unremarkable. No pancreatic ductal dilatation or surrounding inflammatory changes. Spleen: Splenomegaly, maximum coronal span 15.1 cm. Adrenals/Urinary Tract: Adrenal glands are unremarkable. Kidneys are normal, without renal calculi, focal lesion, or hydronephrosis. Bladder is unremarkable. Stomach/Bowel: Stomach is within normal limits. Appendix appears normal. There is extensive fatty mural stratification of the terminal ileum, cecum, and rectum, with mild, well thickening, fat stranding, and vascular combing about the remaining colon (e.g. series 2, image 46, 70). Vascular/Lymphatic: Mixed calcific atherosclerosis. No enlarged abdominal or pelvic lymph nodes. Reproductive: No mass or other abnormality. Other: No abdominal wall hernia or abnormality. Small volume of fluid in the low abdomen.  Musculoskeletal: No acute or significant osseous findings. IMPRESSION: 1. There is extensive fatty mural stratification of the terminal ileum, cecum, and rectum, with mild, well thickening, fat stranding, and vascular combing about the remaining colon (e.g. series 2, image 46, 70). Findings are consistent with nonspecific infectious, inflammatory, or ischemic colitis, and particularly chronic or recurrent etiology such as inflammatory bowel disease. 2.  Hepatomegaly and hepatic steatosis. 3.  Splenomegaly. 4.  Small volume nonspecific fluid in the low abdomen. Electronically Signed   By: Eddie Candle M.D.   On: 12/30/2018 15:53    Labs:  CBC: Recent Labs    12/30/18 1803 12/31/18 0526 01/01/19 0433 01/07/19 0758  WBC 4.5 4.7 4.5 3.9*  HGB 5.4* 7.4* 7.9* 7.6*  HCT 15.5* 20.9* 23.2* 22.9*  PLT 92* 99* 102* 153    COAGS: Recent Labs    12/31/18 1121  INR 1.2  APTT 32    BMP: Recent Labs    12/30/18 1204 12/31/18 0526 12/31/18 1121 01/01/19 0433  NA 131* 133*  --  135  K 2.4* 2.6* 3.4* 3.7  CL 81* 93*  --  103  CO2 32 29  --  24  GLUCOSE 122* 86  --  85  BUN 11 13  --  11  CALCIUM 8.2* 7.0*  --  7.2*  CREATININE 0.76 0.77  --  0.77  GFRNONAA >60 >60  --  >60  GFRAA >60 >60  --  >60    LIVER FUNCTION TESTS: Recent Labs    12/30/18 1204 12/31/18 1121  BILITOT 3.2* 2.8*  AST 67*  --   ALT 18  --   ALKPHOS 208*  --   PROT 7.0  --   ALBUMIN 3.0*  --     TUMOR MARKERS: No results for input(s): AFPTM, CEA, CA199, CHROMGRNA in the last 8760 hours.  Assessment and Plan:  CHANTEL TETI is a 43 y.o. female with no significant past medical history who presents today for CT-guided bone marrow biopsy for the work-up of anemia and hepatosplenomegaly of uncertain etiology at the request of Dr. Janese Banks.    Risks and benefits of CT-guided bone marrow biopsy and aspiration was discussed with the patient and/or patient's family including, but not limited to bleeding, infection,  damage to adjacent structures or low yield requiring additional tests.  All of the questions were answered and there is agreement to proceed.  Consent signed and in chart.    Thank you for this interesting consult.  I greatly enjoyed meeting Morgan Sanders and look forward to participating in their care.  A copy of this report was sent to the requesting provider on this date.  Electronically Signed: Sandi Mariscal, MD 01/07/2019, 8:39 AM   I spent a total of 15 Minutes in face to face in  clinical consultation, greater than 50% of which was counseling/coordinating care for CT-guided bone marrow biopsy and aspiration

## 2019-01-08 LAB — C3 COMPLEMENT: C3 Complement: 107 mg/dL (ref 82–167)

## 2019-01-08 LAB — C4 COMPLEMENT: Complement C4, Body Fluid: 30 mg/dL (ref 14–44)

## 2019-01-09 LAB — COPPER, SERUM: Copper: 92 ug/dL (ref 72–166)

## 2019-01-09 LAB — ZINC: Zinc: 56 ug/dL (ref 56–134)

## 2019-01-11 LAB — ERYTHROPOIETIN: Erythropoietin: 72.4 m[IU]/mL — ABNORMAL HIGH (ref 2.6–18.5)

## 2019-01-12 LAB — CMV DNA, QUANTITATIVE, PCR
CMV DNA Quant: NEGATIVE IU/mL
Log10 CMV Qn DNA Pl: UNDETERMINED log10 IU/mL

## 2019-01-18 ENCOUNTER — Telehealth: Payer: Self-pay | Admitting: *Deleted

## 2019-01-18 NOTE — Telephone Encounter (Signed)
I called patient about f/u appt after the bx. She can't do video call due to reception and the only way to talk to her is a telephone only. I have sent message to md to see what she wants me to do. Dr. Janese Banks said she will try the doximity and if it does not work then we will go different route. Pt. Agreeable to 6/3 at 12:15.

## 2019-01-18 NOTE — Telephone Encounter (Signed)
Patient called asking for results of Bone Marrow biopsy done 5/22. She hs no follow up appts scheduled. FINAL for OCEANE, FOSSE ([id number]) Patient: KRISTINIA, LEAVY Collected: 01/07/2019 Client: [facility] Accession: [id number] Received: 01/07/2019 John "Ronny Bacon, MD DOB: July 20, 1976 Age: 43 Gender: F Reported: 01/12/2019 [address] Patient Ph: [phone] MRN #: [id number] [place] , [place] [address] Visit #: [id number] Chart #: Phone: Fax: [phone] CC: Randa Evens, MD BONE MARROW REPORT FINAL DIAGNOSIS Diagnosis Bone Marrow, Aspirate,Biopsy, and Clot - HYPERCELLULAR BONE MARROW FOR AGE WITH TRILINEAGE HEMATOPOIESIS. - SEE COMMENT. PERIPHERAL BLOOD: - NORMOCYTIC-NORMOCHROMIC ANEMIA. - MILD LEUKOPENIA. Diagnosis Note The bone marrow is hypercellular for age with trilineage hematopoiesis. Significant dyspoiesis or increase in blastic cells is not identified, and there is no evidence of a lymphoproliferative process. The findings are non-specific and likely secondary in nature. Nonetheless, correlation with cytogenetic studies is recommended. (BNS:ah 01/11/19) Susanne Greenhouse MD Pathologist, Electronic Signature (Case signed 01/12/2019) GROSS AND MICROSCOPIC INFORMATION Specimen Clinical Information anemia and hepatosplenomegaly of uncertain etiology (ADC) Source Bone Marrow, Aspirate,Biopsy, and Clot Microscopic LAB DATA: CBC performed on 01/07/2019 shows: WBC 3.9 K/ul Neutrophils 61% Bands 4% HB 7.6 g/dl Lymphocytes 28% HCT 22.9 % Monocytes 6% MCV 97.9 fL Eosinophils 0% 1 of 3 FINAL for Hurston, Tarrah L ([id number]) Microscopic(continued) RDW 17.4 % Basophils 1% PLT 153 K/ul PERIPHERAL BLOOD SMEAR: The red blood cells display mild anisopoikilocytosis with mild polychromasia. The white blood cells are slightly decreased in number with no significant morphologic abnormalities. The platelets are normal in number. BONE MARROW ASPIRATE: Erythroid  precursors: Orderly and progressive maturation. Granulocytic precursors: Orderly and progressive maturation. Megakaryocytes: Abundant with predominantly normal morphology including many early forms. Lymphocytes/plasma cells: Large aggregates not present. TOUCH PREPARATIONS: A mixture of cell types present. CLOT and BIOPSY: Sections show 60 to 80% cellularity with a mixture of myeloid cell types. Significant lymphoid aggregates or granulomata are not present. IRON STAIN: Iron stains are performed on a bone marrow aspirate smear and section of clot. The controls stained appropriately. Storage Iron: Abundant. Ringed Sideroblasts: Absent. ADDITIONAL DATA / TESTING: Specimen was sent for cytogenetic analysis and a separate report will follow. Flow cytometric analysis of the lymphoid population [id number]) failed to show any monoclonal B cell population or abnormal T cell phenotype. (BNS:gt, 01/12/19) Specimen Table Bone Marrow count performed on 500 cells shows: Blasts: 0% Myeloid 48% Promyelocyts: 0% Myelocytes: 16% Erythroid 37% Metamyelocyts: 0% Bands: 17% Lymphocytes: 13% Neutrophils: 13% Eosinophils: 2% Plasma Cells: 1% Basophils: 0% Monocytes: 1% M:E ratio: 1.30 Gross Received in B plus is a 0.7 x 0.6 x 0.1 cm aggregate of red brown soft material, submitted in block A. Also received in B plus is a 2.3 x 0.2 cm core of tan red firm tissue, submitted in block B following decalcification. (SW:gt, 01/11/19) 2 of 3 FINAL for Reh, Ailany L ([id number]) Report signed out from following location(s) Technical component and interpretation was performed at [facility], [place], [place] [address]. CLIA #: [id number], 3 of 3

## 2019-01-18 NOTE — Telephone Encounter (Signed)
I called patient and set up appt. I made telephone note about it

## 2019-01-19 ENCOUNTER — Inpatient Hospital Stay: Payer: 59 | Admitting: Oncology

## 2019-01-19 ENCOUNTER — Encounter: Payer: Self-pay | Admitting: Oncology

## 2019-01-19 NOTE — Progress Notes (Unsigned)
Video visit to get bone marrow results.

## 2019-01-21 DIAGNOSIS — G629 Polyneuropathy, unspecified: Secondary | ICD-10-CM | POA: Insufficient documentation

## 2019-01-21 DIAGNOSIS — G8929 Other chronic pain: Secondary | ICD-10-CM | POA: Insufficient documentation

## 2019-01-21 DIAGNOSIS — M545 Low back pain, unspecified: Secondary | ICD-10-CM | POA: Insufficient documentation

## 2019-01-21 DIAGNOSIS — E538 Deficiency of other specified B group vitamins: Secondary | ICD-10-CM | POA: Insufficient documentation

## 2019-01-21 DIAGNOSIS — K760 Fatty (change of) liver, not elsewhere classified: Secondary | ICD-10-CM | POA: Insufficient documentation

## 2019-01-21 DIAGNOSIS — M5136 Other intervertebral disc degeneration, lumbar region: Secondary | ICD-10-CM | POA: Insufficient documentation

## 2019-01-23 MED ORDER — TRAMADOL HCL 50 MG PO TABS
50.00 | ORAL_TABLET | ORAL | Status: DC
Start: ? — End: 2019-01-23

## 2019-01-23 MED ORDER — LACTOBACILLUS ACID-PECTIN PO CAPS
0.50 | ORAL_CAPSULE | ORAL | Status: DC
Start: ? — End: 2019-01-23

## 2019-01-23 MED ORDER — ALBUTEROL SULFATE (2.5 MG/3ML) 0.083% IN NEBU
2.50 | INHALATION_SOLUTION | RESPIRATORY_TRACT | Status: DC
Start: ? — End: 2019-01-23

## 2019-01-23 MED ORDER — VITAMIN D (ERGOCALCIFEROL) 1.25 MG (50000 UT) PO CAPS
50000.00 | ORAL_CAPSULE | ORAL | Status: DC
Start: ? — End: 2019-01-23

## 2019-01-23 MED ORDER — TIZANIDINE HCL 2 MG PO TABS
2.00 | ORAL_TABLET | ORAL | Status: DC
Start: ? — End: 2019-01-23

## 2019-01-23 MED ORDER — PREGABALIN 50 MG PO CAPS
50.00 | ORAL_CAPSULE | ORAL | Status: DC
Start: ? — End: 2019-01-23

## 2019-01-23 MED ORDER — FAMOTIDINE 20 MG PO TABS
20.00 | ORAL_TABLET | ORAL | Status: DC
Start: ? — End: 2019-01-23

## 2019-01-23 MED ORDER — ONDANSETRON HCL 8 MG PO TABS
4.00 | ORAL_TABLET | ORAL | Status: DC
Start: ? — End: 2019-01-23

## 2019-01-23 MED ORDER — Medication
10.00 | Status: DC
Start: 2019-01-22 — End: 2019-01-23

## 2019-01-24 ENCOUNTER — Inpatient Hospital Stay: Payer: 59

## 2019-01-24 ENCOUNTER — Inpatient Hospital Stay: Payer: 59 | Admitting: Oncology

## 2019-01-24 ENCOUNTER — Other Ambulatory Visit: Payer: Self-pay

## 2019-01-25 ENCOUNTER — Ambulatory Visit: Payer: 59 | Admitting: Oncology

## 2019-01-28 ENCOUNTER — Ambulatory Visit: Payer: 59 | Admitting: Oncology

## 2019-01-28 ENCOUNTER — Other Ambulatory Visit: Payer: 59

## 2019-01-31 ENCOUNTER — Other Ambulatory Visit: Payer: Self-pay | Admitting: Gastroenterology

## 2019-01-31 DIAGNOSIS — R162 Hepatomegaly with splenomegaly, not elsewhere classified: Secondary | ICD-10-CM

## 2019-01-31 DIAGNOSIS — R7989 Other specified abnormal findings of blood chemistry: Secondary | ICD-10-CM

## 2019-02-04 ENCOUNTER — Other Ambulatory Visit: Payer: Self-pay

## 2019-02-04 ENCOUNTER — Ambulatory Visit
Admission: RE | Admit: 2019-02-04 | Discharge: 2019-02-04 | Disposition: A | Discharge status: Home / Self Care | Payer: 59 | Source: Ambulatory Visit | Attending: Gastroenterology | Admitting: Gastroenterology

## 2019-02-04 DIAGNOSIS — R162 Hepatomegaly with splenomegaly, not elsewhere classified: Secondary | ICD-10-CM

## 2019-02-04 DIAGNOSIS — R945 Abnormal results of liver function studies: Secondary | ICD-10-CM | POA: Diagnosis present

## 2019-02-04 DIAGNOSIS — R7989 Other specified abnormal findings of blood chemistry: Secondary | ICD-10-CM

## 2019-02-07 ENCOUNTER — Other Ambulatory Visit: Payer: Self-pay | Admitting: Gastroenterology

## 2019-02-07 DIAGNOSIS — K7031 Alcoholic cirrhosis of liver with ascites: Secondary | ICD-10-CM

## 2019-02-09 ENCOUNTER — Ambulatory Visit
Admission: RE | Admit: 2019-02-09 | Discharge: 2019-02-09 | Disposition: A | Discharge status: Home / Self Care | Payer: 59 | Source: Ambulatory Visit | Attending: Gastroenterology | Admitting: Gastroenterology

## 2019-02-09 ENCOUNTER — Other Ambulatory Visit: Payer: Self-pay

## 2019-02-09 ENCOUNTER — Other Ambulatory Visit: Payer: Self-pay | Admitting: Gastroenterology

## 2019-02-09 DIAGNOSIS — K7031 Alcoholic cirrhosis of liver with ascites: Secondary | ICD-10-CM

## 2019-02-09 NOTE — Procedures (Signed)
Pre Procedural Dx: Cirrhosis  Post Procedural Dx: Same  Sonographic evaluation of the abdomen performed by the dictating interventional radiologist demonstrates a trace/small amount of intra-abdominal ascites with largest pockets adjacent to liver edge and within the midline of the lower pelvis.  Lack of significant peritoneal fluid was discussed with referring gastroenterologist, Dr. Chauncey Mann, and decision was made not to proceed with ultrasound-guided paracentesis at this time.   No paracentesis attempted.  Ronny Bacon, MD Pager #: [phone]

## 2019-02-10 ENCOUNTER — Inpatient Hospital Stay: Payer: 59 | Attending: Oncology | Admitting: Oncology

## 2019-04-21 ENCOUNTER — Other Ambulatory Visit: Payer: Self-pay | Admitting: Nurse Practitioner

## 2019-04-21 DIAGNOSIS — Z1231 Encounter for screening mammogram for malignant neoplasm of breast: Secondary | ICD-10-CM

## 2019-05-13 ENCOUNTER — Inpatient Hospital Stay: Payer: 59 | Admitting: Oncology

## 2019-05-25 ENCOUNTER — Ambulatory Visit
Admission: RE | Admit: 2019-05-25 | Discharge: 2019-05-25 | Disposition: A | Discharge status: Home / Self Care | Payer: 59 | Source: Ambulatory Visit | Attending: Nurse Practitioner | Admitting: Nurse Practitioner

## 2019-05-25 DIAGNOSIS — Z1231 Encounter for screening mammogram for malignant neoplasm of breast: Secondary | ICD-10-CM | POA: Diagnosis not present

## 2019-05-27 ENCOUNTER — Telehealth: Payer: Self-pay

## 2019-05-27 NOTE — Telephone Encounter (Signed)
Called patient for pre-assessment appt for Monday.  She states that she was not aware of this appt.  This appt needs to be rescheduled.  Before getting this scheduled she asks for a call first so she can get the appt around other appts she may already have scheduled.

## 2019-05-30 ENCOUNTER — Telehealth: Payer: Self-pay | Admitting: *Deleted

## 2019-05-30 ENCOUNTER — Inpatient Hospital Stay: Payer: 59 | Attending: Oncology | Admitting: Oncology

## 2019-05-30 NOTE — Telephone Encounter (Signed)
LM to try and R/S appt

## 2019-05-30 NOTE — Telephone Encounter (Signed)
Called pt and said to have pt call back so that we can r/s her appt. According to the person who called pt last Friday for today's visit had said that she never knew about the appt and she will not be able to come to the appt. I wanted to make sure I can get in touch with her to make sure we get an appt that works for her. She needs labs and see md. Left my direct number to call on cell/ and home phone syv

## 2019-06-09 ENCOUNTER — Telehealth: Payer: Self-pay | Admitting: Oncology

## 2019-06-09 NOTE — Telephone Encounter (Signed)
LM to try and R/S NS appointment

## 2019-06-19 ENCOUNTER — Encounter: Payer: Self-pay | Admitting: *Deleted

## 2019-06-19 NOTE — Telephone Encounter (Signed)
She has no showed for multiple appointments. We will send her a discharge letter

## 2019-06-20 NOTE — Progress Notes (Signed)
Patient is coming in for follow up, she is doing well  Patient feels a little nauseated had questions about this and hair is falling out easily so she has started a hair skin and nails vitamin. This has been over the last 8 months.

## 2019-06-21 ENCOUNTER — Inpatient Hospital Stay: Payer: 59 | Attending: Oncology | Admitting: Oncology

## 2019-06-21 ENCOUNTER — Other Ambulatory Visit: Payer: Self-pay

## 2019-06-21 ENCOUNTER — Inpatient Hospital Stay: Payer: 59

## 2019-06-21 VITALS — BP 116/78 | HR 60 | Temp 97.4°F | Resp 16 | Ht 65.0 in | Wt 151.0 lb

## 2019-06-21 DIAGNOSIS — D649 Anemia, unspecified: Secondary | ICD-10-CM | POA: Diagnosis not present

## 2019-06-21 DIAGNOSIS — F17210 Nicotine dependence, cigarettes, uncomplicated: Secondary | ICD-10-CM | POA: Diagnosis not present

## 2019-06-21 DIAGNOSIS — G620 Drug-induced polyneuropathy: Secondary | ICD-10-CM | POA: Diagnosis not present

## 2019-06-21 DIAGNOSIS — E538 Deficiency of other specified B group vitamins: Secondary | ICD-10-CM

## 2019-06-21 LAB — CBC WITH DIFFERENTIAL/PLATELET
Abs Immature Granulocytes: 0.01 10*3/uL (ref 0.00–0.07)
Basophils Absolute: 0 10*3/uL (ref 0.0–0.1)
Basophils Relative: 1 %
Eosinophils Absolute: 0.1 10*3/uL (ref 0.0–0.5)
Eosinophils Relative: 1 %
HCT: 34.2 % — ABNORMAL LOW (ref 36.0–46.0)
Hemoglobin: 11.4 g/dL — ABNORMAL LOW (ref 12.0–15.0)
Immature Granulocytes: 0 %
Lymphocytes Relative: 48 %
Lymphs Abs: 2.1 10*3/uL (ref 0.7–4.0)
MCH: 28.6 pg (ref 26.0–34.0)
MCHC: 33.3 g/dL (ref 30.0–36.0)
MCV: 85.7 fL (ref 80.0–100.0)
Monocytes Absolute: 0.3 10*3/uL (ref 0.1–1.0)
Monocytes Relative: 7 %
Neutro Abs: 1.9 10*3/uL (ref 1.7–7.7)
Neutrophils Relative %: 43 %
Platelets: 106 10*3/uL — ABNORMAL LOW (ref 150–400)
RBC: 3.99 MIL/uL (ref 3.87–5.11)
RDW: 15.6 % — ABNORMAL HIGH (ref 11.5–15.5)
WBC: 4.3 10*3/uL (ref 4.0–10.5)
nRBC: 0 % (ref 0.0–0.2)

## 2019-06-21 LAB — FOLATE: Folate: 7.4 ng/mL (ref >5.9)

## 2019-06-21 LAB — RETICULOCYTES
Immature Retic Fract: 5.4 % (ref 2.3–15.9)
RBC.: 3.99 MIL/uL (ref 3.87–5.11)
Retic Count, Absolute: 57.5 10*3/uL (ref 19.0–186.0)
Retic Ct Pct: 1.4 % (ref 0.4–3.1)

## 2019-06-21 LAB — IRON AND TIBC
Iron: 44 ug/dL (ref 28–170)
Saturation Ratios: 14 % (ref 10.4–31.8)
TIBC: 308 ug/dL (ref 250–450)
UIBC: 264 ug/dL

## 2019-06-21 LAB — VITAMIN B12: Vitamin B-12: 204 pg/mL (ref 180–914)

## 2019-06-21 LAB — FERRITIN: Ferritin: 182 ng/mL (ref 11–307)

## 2019-06-21 NOTE — Progress Notes (Signed)
Pt states that she is feeling better. She goes not need b12 any longer the levels came up and was told that she need sb1 and b6 and she feesl neuropathy is getting better. She has not feel since May while she was in hospital

## 2019-06-23 ENCOUNTER — Encounter: Payer: Self-pay | Admitting: Oncology

## 2019-06-23 ENCOUNTER — Telehealth: Payer: Self-pay

## 2019-06-23 NOTE — Telephone Encounter (Signed)
Called patient and let her know Dr. Elroy Channel recommendations. Patient stated that she would take 1000 mcg of Vitamin B12 daily. Patient stated that she would go to her pharmacy and buy it. Patient agreed and had no further questions.

## 2019-06-23 NOTE — Telephone Encounter (Signed)
-----   Message from Sindy Guadeloupe, MD sent at 06/23/2019  7:55 AM EST ----- Please let her know her b12 is still low. She should either take daily pills 1000 mcg daily or she can get monthly shots here

## 2019-06-23 NOTE — Progress Notes (Addendum)
   Hematology/Oncology Consult note [facility]  [phone] [phone]  Patient Care Team: Lam, Lynn E, NP as PCP - General (Nurse Practitioner)   Name of the patient: Morgan Sanders  [id number]  12/24/1975   Date of visit: 06/23/19  Diagnosis-normocytic anemia likely secondary to chronic liver disease  Chief complaint/ Reason for visit-routine follow-up of anemia  Heme/Onc history: Patient is a 43-year-old female who was seen by me back in May 2020 when she was admitted to the hospital for bilateral lower extremity weakness.  At that time she was found to have B12 and folic acid deficiency and was started on supplements.  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  Her hemoglobin acutely dropped from a baseline of 11-12 down to a 7.4.  She had extensive anemia work-up including hemolysis testing which was negative, myeloma panel that was negative as well.  Patient also subsequently saw neurology for her bilateral lower extremity weakness and was found to have pure sensory polyneuropathy of unclear etiology possibly secondary to alcohol intake.  Patient had a bone marrow biopsy back in May 2020 as well which did not show any evidence of lymphoproliferative neoplasm or malignancy and did not reveal a cause of her anemia.  Patient never followed up with me since May 2020 and is here to reestablish follow-up  Interval history-patient has been receiving B1 and B6 supplementation through neurology at [facility] and reports that her neuropathy has improved significantly.  She has gained weight and is able to walk without the help of walker or a cane.  She has not had any falls since May 2020.  She is also completely abstaining from alcohol.  She feels like a new person at this time.  Bilateral lower extremity weakness and neuropathy has significantly improved  ECOG  PS- 1 Pain scale- 0   Review of systems- Review of Systems  Constitutional: Positive for malaise/fatigue. Negative for chills, fever and weight loss.  HENT: Negative for congestion, ear discharge and nosebleeds.   Eyes: Negative for blurred vision.  Respiratory: Negative for cough, hemoptysis, sputum production, shortness of breath and wheezing.   Cardiovascular: Negative for chest pain, palpitations, orthopnea and claudication.  Gastrointestinal: Negative for abdominal pain, blood in stool, constipation, diarrhea, heartburn, melena, nausea and vomiting.  Genitourinary: Negative for dysuria, flank pain, frequency, hematuria and urgency.  Musculoskeletal: Negative for back pain, joint pain and myalgias.  Skin: Negative for rash.  Neurological: Positive for sensory change (Peripheral neuropathy). Negative for dizziness, tingling, focal weakness, seizures, weakness and headaches.  Endo/Heme/Allergies: Does not bruise/bleed easily.  Psychiatric/Behavioral: Negative for depression and suicidal ideas. The patient does not have insomnia.       No Known Allergies   Past Medical History:  Diagnosis Date  . Anemia   . Neuromuscular disorder (HCC)    neuropathy     Past Surgical History:  Procedure Laterality Date  . CHOLECYSTECTOMY    . ESOPHAGOGASTRODUODENOSCOPY (EGD) WITH PROPOFOL N/A 12/31/2018   Procedure: ESOPHAGOGASTRODUODENOSCOPY (EGD) WITH PROPOFOL;  Surgeon: Wohl, Darren, MD;  Location: [facility] ENDOSCOPY;  Service: Endoscopy;  Laterality: N/A;  . TUBAL LIGATION      Social History   Socioeconomic History  . Marital status: Married    Spouse name: Randy  . Number of children: 2  . Years of education: Not on file  . Highest education level: Not on file  Occupational History  .   Not on file  Social Needs  . Financial resource strain: Not hard at all  . Food insecurity    Worry: Never true    Inability: Never true  . Transportation needs    Medical: No    Non-medical: No   Tobacco Use  . Smoking status: Current Every Day Smoker    Packs/day: 1.00    Types: Cigarettes  . Smokeless tobacco: Never Used  Substance and Sexual Activity  . Alcohol use: Not Currently  . Drug use: Never  . Sexual activity: Not Currently  Lifestyle  . Physical activity    Days per week: 0 days    Minutes per session: 0 min  . Stress: Not at all  Relationships  . Social connections    Talks on phone: More than three times a week    Gets together: More than three times a week    Attends religious service: More than 4 times per year    Active member of club or organization: Not on file    Attends meetings of clubs or organizations: Not on file    Relationship status: Not on file  . Intimate partner violence    Fear of current or ex partner: No    Emotionally abused: No    Physically abused: No    Forced sexual activity: No  Other Topics Concern  . Not on file  Social History Narrative  . Not on file    Family History  Problem Relation Age of Onset  . Breast cancer Maternal Grandmother 78  . Bone cancer Maternal Great-grandmother      Current Outpatient Medications:  .  albuterol (VENTOLIN HFA) 108 (90 Base) MCG/ACT inhaler, Inhale 2 puffs into the lungs every 4 (four) hours as needed for shortness of breath or wheezing., Disp: , Rfl:  .  Biotin w/ Vitamins C & E (HAIR/SKIN/NAILS PO), Take 1 tablet by mouth daily., Disp: , Rfl:  .  Cholecalciferol (VITAMIN D3) 125 MCG (5000 UT) CAPS, Take 1 capsule by mouth daily., Disp: , Rfl:  .  escitalopram (LEXAPRO) 10 MG tablet, Take 10 mg by mouth daily., Disp: , Rfl:  .  famotidine (PEPCID) 20 MG tablet, Take 20 mg by mouth daily. , Disp: , Rfl:  .  MAG-G 500 (27 Mg) MG TABS, TAKE 1 TABLET (500MG) BY MOUTH EVERY DAY, Disp: , Rfl:  .  Potassium Chloride ER 20 MEQ TBCR, Take 1 tablet by mouth daily., Disp: , Rfl:  .  pregabalin (LYRICA) 100 MG capsule, Take 200 mg by mouth 2 (two) times daily., Disp: , Rfl:  .  pyridOXINE  (VITAMIN B-6) 100 MG tablet, Take 100 mg by mouth daily., Disp: , Rfl:  .  thiamine (VITAMIN B-1) 100 MG tablet, Take 100 mg by mouth daily., Disp: , Rfl:  .  traMADol (ULTRAM) 50 MG tablet, Take 50 mg by mouth 2 (two) times daily as needed., Disp: , Rfl:   Physical exam:  Vitals:   06/23/19 1405  BP: 116/78  Pulse: 60  Resp: 16  Temp: (!) 97.4 F (36.3 C)  TempSrc: Tympanic  Weight: 151 lb (68.5 kg)  Height: 5' 5" (1.651 m)   Physical Exam Constitutional:      General: She is not in acute distress. HENT:     Head: Normocephalic and atraumatic.  Eyes:     Pupils: Pupils are equal, round, and reactive to light.  Neck:     Musculoskeletal: Normal range of motion.  Cardiovascular:  Rate and Rhythm: Normal rate and regular rhythm.     Heart sounds: Normal heart sounds.  Pulmonary:     Effort: Pulmonary effort is normal.     Breath sounds: Normal breath sounds.  Abdominal:     General: Bowel sounds are normal.     Palpations: Abdomen is soft.  Skin:    General: Skin is warm and dry.  Neurological:     Mental Status: She is alert and oriented to person, place, and time.      CMP Latest Ref Rng & Units 01/07/2019  Glucose 70 - 99 mg/dL 94  BUN 6 - 20 mg/dL 7  Creatinine 0.44 - 1.00 mg/dL 0.87  Sodium 135 - 145 mmol/L 139  Potassium 3.5 - 5.1 mmol/L 2.8(L)  Chloride 98 - 111 mmol/L 106  CO2 22 - 32 mmol/L 23  Calcium 8.9 - 10.3 mg/dL 8.2(L)  Total Protein 6.5 - 8.1 g/dL 6.2(L)  Total Bilirubin 0.3 - 1.2 mg/dL 1.2  Alkaline Phos 38 - 126 U/L 128(H)  AST 15 - 41 U/L 31  ALT 0 - 44 U/L 10   CBC Latest Ref Rng & Units 06/21/2019  WBC 4.0 - 10.5 K/uL 4.3  Hemoglobin 12.0 - 15.0 g/dL 11.4(L)  Hematocrit 36.0 - 46.0 % 34.2(L)  Platelets 150 - 400 K/uL 106(L)    No images are attached to the encounter.  Mm 3d Screen Breast Bilateral  Result Date: 05/25/2019 CLINICAL DATA:  Screening. EXAM: DIGITAL SCREENING BILATERAL MAMMOGRAM WITH TOMO AND CAD COMPARISON:   Previous exam(s). ACR Breast Density Category b: There are scattered areas of fibroglandular density. FINDINGS: There are no findings suspicious for malignancy. Images were processed with CAD. IMPRESSION: No mammographic evidence of malignancy. A result letter of this screening mammogram will be mailed directly to the patient. RECOMMENDATION: Screening mammogram in one year. (Code:SM-B-01Y) BI-RADS CATEGORY  1: Negative. Electronically Signed   By: Lajean Manes M.D.   On: 05/25/2019 14:58     Assessment and plan- Patient is a 43 y.o. female with normocytic anemia likely secondary to Chronic liver disease  Patient's hemoglobin is significantly improved from 7-11.4 presently over the last 5 months.  Her iron studies are presently normal and she does not require IV iron at this time.  Folic acid levels are also normal.  B12 levels are low again at 204 And we will get in touch with her if she would like to go back on monthly B12 shots versus continuing oral B12 1000 mcg daily  I will see her back in 3 months with labs   Visit Diagnosis 1. Anemia, unspecified type   2. B12 deficiency      Dr. Randa Evens, MD, MPH [facility] [phone] 06/23/2019 1:27 PM

## 2019-09-22 ENCOUNTER — Inpatient Hospital Stay: Payer: Medicaid Other

## 2019-09-22 ENCOUNTER — Inpatient Hospital Stay: Payer: Medicaid Other | Admitting: Oncology

## 2019-10-17 ENCOUNTER — Telehealth: Payer: Self-pay | Admitting: Oncology

## 2019-10-17 NOTE — Telephone Encounter (Signed)
Patient left voicemail stating she wanted to cancel her 3/3 and 3/4 appointments.  She will call back to reschedule.

## 2019-10-19 ENCOUNTER — Inpatient Hospital Stay: Payer: Medicaid Other

## 2019-10-20 ENCOUNTER — Telehealth: Payer: Medicaid Other | Admitting: Oncology

## 2019-12-28 DIAGNOSIS — G629 Polyneuropathy, unspecified: Secondary | ICD-10-CM | POA: Diagnosis not present

## 2020-01-27 DIAGNOSIS — Z79899 Other long term (current) drug therapy: Secondary | ICD-10-CM | POA: Diagnosis not present

## 2020-01-27 DIAGNOSIS — G629 Polyneuropathy, unspecified: Secondary | ICD-10-CM | POA: Diagnosis not present

## 2020-02-07 ENCOUNTER — Ambulatory Visit (INDEPENDENT_AMBULATORY_CARE_PROVIDER_SITE_OTHER)
Admission: RE | Admit: 2020-02-07 | Discharge: 2020-02-07 | Disposition: A | Discharge status: Home / Self Care | Payer: BC Managed Care – PPO | Source: Ambulatory Visit | Attending: Family Medicine | Admitting: Family Medicine

## 2020-02-07 ENCOUNTER — Ambulatory Visit: Payer: BC Managed Care – PPO | Admitting: Family Medicine

## 2020-02-07 ENCOUNTER — Encounter: Payer: Self-pay | Admitting: Family Medicine

## 2020-02-07 ENCOUNTER — Other Ambulatory Visit: Payer: Self-pay

## 2020-02-07 VITALS — BP 124/70 | HR 71 | Temp 97.8°F | Ht 66.0 in | Wt 184.5 lb

## 2020-02-07 DIAGNOSIS — M952 Other acquired deformity of head: Secondary | ICD-10-CM | POA: Diagnosis not present

## 2020-02-07 DIAGNOSIS — M25562 Pain in left knee: Secondary | ICD-10-CM | POA: Insufficient documentation

## 2020-02-07 DIAGNOSIS — F331 Major depressive disorder, recurrent, moderate: Secondary | ICD-10-CM | POA: Diagnosis not present

## 2020-02-07 DIAGNOSIS — M7989 Other specified soft tissue disorders: Secondary | ICD-10-CM | POA: Diagnosis not present

## 2020-02-07 DIAGNOSIS — F411 Generalized anxiety disorder: Secondary | ICD-10-CM

## 2020-02-07 DIAGNOSIS — G629 Polyneuropathy, unspecified: Secondary | ICD-10-CM | POA: Diagnosis not present

## 2020-02-07 DIAGNOSIS — T7840XA Allergy, unspecified, initial encounter: Secondary | ICD-10-CM | POA: Insufficient documentation

## 2020-02-07 MED ORDER — ESCITALOPRAM OXALATE 20 MG PO TABS: 20.0000 mg | ORAL_TABLET | Freq: Every day | ORAL | 1 refills | Status: DC

## 2020-02-07 MED ORDER — MAG-G 500 (27 MG) MG PO TABS: ORAL_TABLET | ORAL | 3 refills | Status: DC

## 2020-02-07 MED ORDER — POTASSIUM CHLORIDE ER 20 MEQ PO TBCR: 1.0000 | EXTENDED_RELEASE_TABLET | Freq: Every day | ORAL | 3 refills | Status: DC

## 2020-02-07 NOTE — Assessment & Plan Note (Signed)
Pt notes some adjustment to health issues and her grandchildren moving away - takes xanax ~10x per month. Trial of increase lexapro to see if this reduces need for xanax. Did become tearful today and noted that is what she would typically take her xanax for.

## 2020-02-07 NOTE — Assessment & Plan Note (Signed)
Small cyst like lesion in the knee. XR today will follow-up read. Anticipate benign lesion.

## 2020-02-07 NOTE — Assessment & Plan Note (Signed)
Small defect in skull noted on exam. Pt reports stable size. Discussed option of CT head now vs watch and wait. She will watch and wait - no alarming symptoms. If growing will image.

## 2020-02-07 NOTE — Patient Instructions (Addendum)
#  Head - let me know if this is growing or if you develop new symptoms - currently about finger tip size - we could get a Head CT to evaluate  #Anxiety - increase lexapro to 20 mg daily - return for xanax refill    Check with your neurologist for labs and call me back and we will do a lab appointment

## 2020-02-07 NOTE — Assessment & Plan Note (Addendum)
Per patient vitamin deficiency - improved with banana bag q6 months. She would like to get bloodwork clustered. She will reach out to her neurologist to see what labs she needs for monitoring vitamins. Is due for electrolyte labs but will defer until all labs needed are known.

## 2020-02-07 NOTE — Progress Notes (Signed)
Subjective:     Morgan Sanders is a 44 y.o. female presenting for establishing care     HPI  #Skin cysts - some present since she was a teenager - on in the left knee that is starting to cause pain  #indention on head - 3-4 months - has been roughly the same size  #anxiety  - taking xanax for anxiety  - she does feel that this helps with her nerve pain by relaxing her body - taking ~10 pills a month - has tried other medications in the past  - has not tried buspar - neuropathy significantly impacted her life and is doing better now and that seemed worsen anxiety  - [place]  - went back to work which helped - leaning on her youngest daughter at home  #tobacco use - stress and nerves - quit for 9 months in the past - tried chantix once w/o improvement -   Review of Systems   Social History   Tobacco Use  Smoking Status Current Every Day Smoker  . Packs/day: 1.00  . Years: 20.00  . Pack years: 20.00  . Types: Cigarettes  Smokeless Tobacco Never Used        Objective:    BP Readings from Last 3 Encounters:  02/07/20 124/70  06/23/19 116/78  02/09/19 (!) 157/104   Wt Readings from Last 3 Encounters:  02/07/20 184 lb 8 oz (83.7 kg)  06/23/19 151 lb (68.5 kg)  12/30/18 199 lb (90.3 kg)    BP 124/70   Pulse 71   Temp 97.8 F (36.6 C) (Temporal)   Ht 5\' 6"  (1.676 m)   Wt 184 lb 8 oz (83.7 kg)   SpO2 98%   BMI 29.78 kg/m    Physical Exam Constitutional:      General: She is not in acute distress.    Appearance: She is well-developed. She is not diaphoretic.  HENT:     Head:     Comments: Midline anterior skull with fingertip defect.     Right Ear: External ear normal.     Left Ear: External ear normal.     Nose: Nose normal.  Eyes:     Conjunctiva/sclera: Conjunctivae normal.  Cardiovascular:     Rate and Rhythm: Normal rate and regular rhythm.     Heart sounds: No murmur heard.   Pulmonary:     Effort:  Pulmonary effort is normal. No respiratory distress.     Breath sounds: Normal breath sounds. No wheezing.  Musculoskeletal:     Cervical back: Neck supple.     Comments: Small cyst in the lateral joint line of the left knee. Mobile and soft.   Skin:    General: Skin is warm and dry.     Capillary Refill: Capillary refill takes less than 2 seconds.     Comments: Several small subdural cysts in the anterior legs. Non tender  Neurological:     Mental Status: She is alert. Mental status is at baseline.  Psychiatric:        Mood and Affect: Mood normal.        Behavior: Behavior normal.      Depression screen [facility] 2/9 02/07/2020  Decreased Interest 0  Down, Depressed, Hopeless 0  PHQ - 2 Score 0  Altered sleeping 0  Tired, decreased energy 1  Change in appetite 0  Feeling bad or failure about yourself  0  Trouble concentrating 1  Moving slowly  or fidgety/restless 0  Suicidal thoughts 0  PHQ-9 Score 2  Difficult doing work/chores Not difficult at all   GAD 7 : Generalized Anxiety Score 02/07/2020  Nervous, Anxious, on Edge 1  Control/stop worrying 1  Worry too much - different things 1  Trouble relaxing 1  Restless 0  Easily annoyed or irritable 0  Afraid - awful might happen 0  Total GAD 7 Score 4  Anxiety Difficulty Not difficult at all         Assessment & Plan:   Problem List Items Addressed This Visit      Nervous and Auditory   Polyneuropathy - Primary    Per patient vitamin deficiency - improved with banana bag q6 months. She would like to get bloodwork clustered. She will reach out to her neurologist to see what labs she needs for monitoring vitamins. Is due for electrolyte labs but will defer until all labs needed are known.       Relevant Medications   ALPRAZolam (XANAX) 0.5 MG tablet   tiZANidine (ZANAFLEX) 4 MG tablet   escitalopram (LEXAPRO) 20 MG tablet   Potassium Chloride ER 20 MEQ TBCR   MAG-G 500 (27 Mg) MG TABS     Musculoskeletal and  Integument   Skull defect    Small defect in skull noted on exam. Pt reports stable size. Discussed option of CT head now vs watch and wait. She will watch and wait - no alarming symptoms. If growing will image.         Other   Moderate episode of recurrent major depressive disorder (HCC)   Relevant Medications   ALPRAZolam (XANAX) 0.5 MG tablet   escitalopram (LEXAPRO) 20 MG tablet   Acute pain of left knee    Small cyst like lesion in the knee. XR today will follow-up read. Anticipate benign lesion.       Relevant Orders   DG Knee Complete 4 Views Left   Generalized anxiety disorder    Pt notes some adjustment to health issues and her grandchildren moving away - takes xanax ~10x per month. Trial of increase lexapro to see if this reduces need for xanax. Did become tearful today and noted that is what she would typically take her xanax for.       Relevant Medications   ALPRAZolam (XANAX) 0.5 MG tablet   escitalopram (LEXAPRO) 20 MG tablet       Return in about 6 weeks (around 03/20/2020) for anxiety.  Lesleigh Noe, MD  This visit occurred during the SARS-CoV-2 public health emergency.  Safety protocols were in place, including screening questions prior to the visit, additional usage of staff PPE, and extensive cleaning of exam room while observing appropriate contact time as indicated for disinfecting solutions.

## 2020-02-09 ENCOUNTER — Telehealth: Payer: Self-pay | Admitting: *Deleted

## 2020-02-09 NOTE — Telephone Encounter (Signed)
Left VM requesting pt to call the office back regarding xray results 

## 2020-02-10 NOTE — Telephone Encounter (Signed)
[name] - Client Nonclinical Telephone Record AccessNurse Client Morgan Sanders - Client Client Site [place] Physician Waunita Schooner- MD Contact Type Call Who Is Calling Patient / Member / Family / Caregiver Caller Name [place] Phone Number [phone] Call Type Message Only Information Provided Reason for Call Returning a Call from the Office Initial [name] states she is returning a call to Dr. Einar Pheasant. Additional Comment Message was taken-Office hours were given. Disp. Time Disposition Final User 02/09/2020 5:45:23 PM General Information Provided Yes [name], [name] Call Closed By: [name] Transaction Date/Time: 02/09/2020 5:42:58 PM (ET)

## 2020-02-10 NOTE — Telephone Encounter (Signed)
Spoke with pt and gave her xray results as instructed.

## 2020-02-10 NOTE — Telephone Encounter (Signed)
Patient returned call about xray results

## 2020-03-15 ENCOUNTER — Ambulatory Visit: Payer: BC Managed Care – PPO | Admitting: Family Medicine

## 2020-04-24 DIAGNOSIS — R197 Diarrhea, unspecified: Secondary | ICD-10-CM | POA: Diagnosis not present

## 2020-04-24 DIAGNOSIS — Z20822 Contact with and (suspected) exposure to covid-19: Secondary | ICD-10-CM | POA: Diagnosis not present

## 2020-05-17 ENCOUNTER — Other Ambulatory Visit: Payer: Self-pay | Admitting: Family Medicine

## 2020-05-17 DIAGNOSIS — F331 Major depressive disorder, recurrent, moderate: Secondary | ICD-10-CM

## 2020-06-28 ENCOUNTER — Other Ambulatory Visit: Payer: Self-pay

## 2020-06-28 ENCOUNTER — Ambulatory Visit (INDEPENDENT_AMBULATORY_CARE_PROVIDER_SITE_OTHER): Payer: BC Managed Care – PPO | Admitting: Family Medicine

## 2020-06-28 ENCOUNTER — Encounter: Payer: Self-pay | Admitting: Family Medicine

## 2020-06-28 ENCOUNTER — Other Ambulatory Visit: Payer: Self-pay | Admitting: Family Medicine

## 2020-06-28 VITALS — BP 138/80 | HR 64 | Temp 97.8°F | Wt 185.8 lb

## 2020-06-28 DIAGNOSIS — Z1159 Encounter for screening for other viral diseases: Secondary | ICD-10-CM

## 2020-06-28 DIAGNOSIS — K7031 Alcoholic cirrhosis of liver with ascites: Secondary | ICD-10-CM | POA: Diagnosis not present

## 2020-06-28 DIAGNOSIS — F331 Major depressive disorder, recurrent, moderate: Secondary | ICD-10-CM

## 2020-06-28 DIAGNOSIS — Z23 Encounter for immunization: Secondary | ICD-10-CM

## 2020-06-28 DIAGNOSIS — Z136 Encounter for screening for cardiovascular disorders: Secondary | ICD-10-CM

## 2020-06-28 DIAGNOSIS — Z114 Encounter for screening for human immunodeficiency virus [HIV]: Secondary | ICD-10-CM

## 2020-06-28 DIAGNOSIS — F411 Generalized anxiety disorder: Secondary | ICD-10-CM

## 2020-06-28 LAB — COMPREHENSIVE METABOLIC PANEL
ALT: 38 U/L — ABNORMAL HIGH (ref 0–35)
AST: 38 U/L — ABNORMAL HIGH (ref 0–37)
Albumin: 4.8 g/dL (ref 3.5–5.2)
Alkaline Phosphatase: 87 U/L (ref 39–117)
BUN: 18 mg/dL (ref 6–23)
CO2: 27 mEq/L (ref 19–32)
Calcium: 9.4 mg/dL (ref 8.4–10.5)
Chloride: 102 mEq/L (ref 96–112)
Creatinine, Ser: 0.89 mg/dL (ref 0.40–1.20)
GFR: 78.02 mL/min (ref >60.00)
Glucose, Bld: 98 mg/dL (ref 70–99)
Potassium: 4.2 mEq/L (ref 3.5–5.1)
Sodium: 136 mEq/L (ref 135–145)
Total Bilirubin: 0.5 mg/dL (ref 0.2–1.2)
Total Protein: 7.5 g/dL (ref 6.0–8.3)

## 2020-06-28 LAB — LIPID PANEL
Cholesterol: 247 mg/dL — ABNORMAL HIGH (ref 0–200)
HDL: 44.9 mg/dL (ref >39.00)
Total CHOL/HDL Ratio: 5
Triglycerides: 487 mg/dL — ABNORMAL HIGH (ref 0.0–149.0)

## 2020-06-28 LAB — CBC
HCT: 37.8 % (ref 36.0–46.0)
Hemoglobin: 13.1 g/dL (ref 12.0–15.0)
MCHC: 34.6 g/dL (ref 30.0–36.0)
MCV: 97.9 fl (ref 78.0–100.0)
Platelets: 106 10*3/uL — ABNORMAL LOW (ref 150.0–400.0)
RBC: 3.86 Mil/uL — ABNORMAL LOW (ref 3.87–5.11)
RDW: 13.1 % (ref 11.5–15.5)
WBC: 5.5 10*3/uL (ref 4.0–10.5)

## 2020-06-28 LAB — LDL CHOLESTEROL, DIRECT: Direct LDL: 113 mg/dL

## 2020-06-28 LAB — PROTIME-INR
INR: 0.9 ratio (ref 0.8–1.0)
Prothrombin Time: 10.6 s (ref 9.6–13.1)

## 2020-06-28 MED ORDER — ALPRAZOLAM 0.5 MG PO TABS: 0.5000 mg | ORAL_TABLET | ORAL | 1 refills | Status: DC | PRN

## 2020-06-28 MED ORDER — ALBUTEROL SULFATE HFA 108 (90 BASE) MCG/ACT IN AERS: 2.0000 | INHALATION_SPRAY | RESPIRATORY_TRACT | 1 refills | Status: DC | PRN

## 2020-06-28 MED ORDER — ESCITALOPRAM OXALATE 20 MG PO TABS: 20.0000 mg | ORAL_TABLET | Freq: Every day | ORAL | 3 refills | Status: DC

## 2020-06-28 NOTE — Assessment & Plan Note (Signed)
Doing well. Cont lexapro 20 mg

## 2020-06-28 NOTE — Assessment & Plan Note (Signed)
Doing well. Cont lexapro. Xanax refill - rare use #10 last filled in April.

## 2020-06-28 NOTE — Assessment & Plan Note (Signed)
No symptoms currently. Cont to avoid alcohol. Notes f/u with Dr. Chauncey Mann with [facility] in Jan - appreciate support. Will get labs to assess progression. She will continue to avoid alcohol and stop NSAID use (only prn)

## 2020-06-28 NOTE — Progress Notes (Signed)
Subjective:     Morgan Sanders is a 44 y.o. female presenting for Follow-up     HPI   #Anxiety/depression - increased lexapro - has not needed xanax as often as she was using before  #Alcoholic cirrhosis - denies current alcohol use - unaware of diagnosis but has GI specialist - endorses long hx of enlarged liver and getting [place]  Review of Systems   Social History   Tobacco Use  Smoking Status Former Smoker  . Packs/day: 1.00  . Years: 20.00  . Pack years: 20.00  . Types: Cigarettes  Smokeless Tobacco Never Used        Objective:    BP Readings from Last 3 Encounters:  06/28/20 138/80  02/07/20 124/70  06/23/19 116/78   Wt Readings from Last 3 Encounters:  06/28/20 185 lb 12 oz (84.3 kg)  02/07/20 184 lb 8 oz (83.7 kg)  06/23/19 151 lb (68.5 kg)    BP 138/80   Pulse 64   Temp 97.8 F (36.6 C) (Temporal)   Wt 185 lb 12 oz (84.3 kg)   SpO2 99%   BMI 29.98 kg/m    Physical Exam Constitutional:      General: She is not in acute distress.    Appearance: She is well-developed. She is not diaphoretic.  HENT:     Right Ear: External ear normal.     Left Ear: External ear normal.     Nose: Nose normal.  Eyes:     Conjunctiva/sclera: Conjunctivae normal.  Cardiovascular:     Rate and Rhythm: Normal rate and regular rhythm.  Pulmonary:     Effort: Pulmonary effort is normal. No respiratory distress.     Breath sounds: Normal breath sounds. No wheezing.  Abdominal:     General: Abdomen is flat. Bowel sounds are normal.     Palpations: There is hepatomegaly.     Tenderness: There is no abdominal tenderness.  Musculoskeletal:     Cervical back: Neck supple.  Skin:    General: Skin is warm and dry.     Capillary Refill: Capillary refill takes less than 2 seconds.  Neurological:     Mental Status: She is alert. Mental status is at baseline.  Psychiatric:        Mood and Affect: Mood normal.        Behavior: Behavior normal.      GAD 7 :  Generalized Anxiety Score 02/07/2020  Nervous, Anxious, on Edge 1  Control/stop worrying 1  Worry too much - different things 1  Trouble relaxing 1  Restless 0  Easily annoyed or irritable 0  Afraid - awful might happen 0  Total GAD 7 Score 4  Anxiety Difficulty Not difficult at all    Depression screen [facility] 2/9 06/28/2020 02/07/2020  Decreased Interest 0 0  Down, Depressed, Hopeless 0 0  PHQ - 2 Score 0 0  Altered sleeping - 0  Tired, decreased energy - 1  Change in appetite - 0  Feeling bad or failure about yourself  - 0  Trouble concentrating - 1  Moving slowly or fidgety/restless - 0  Suicidal thoughts - 0  PHQ-9 Score - 2  Difficult doing work/chores - Not difficult at all        Assessment & Plan:   Problem List Items Addressed This Visit      Digestive   Alcoholic cirrhosis of liver with ascites ([place]) - Primary    No symptoms currently. Cont to avoid alcohol.  Notes f/u with Dr. Chauncey Mann with [facility] in Jan - appreciate support. Will get labs to assess progression. She will continue to avoid alcohol and stop NSAID use (only prn)      Relevant Orders   Comprehensive metabolic panel   CBC   Protime-INR     Other   Moderate episode of recurrent major depressive disorder ([place])    Doing well. Cont lexapro 20 mg      Relevant Medications   escitalopram (LEXAPRO) 20 MG tablet   ALPRAZolam (XANAX) 0.5 MG tablet   Generalized anxiety disorder    Doing well. Cont lexapro. Xanax refill - rare use #10 last filled in April.       Relevant Medications   escitalopram (LEXAPRO) 20 MG tablet   ALPRAZolam (XANAX) 0.5 MG tablet    Other Visit Diagnoses    Need for influenza vaccination       Relevant Medications   escitalopram (LEXAPRO) 20 MG tablet   ALPRAZolam (XANAX) 0.5 MG tablet   Other Relevant Orders   Flu Vaccine QUAD 36+ mos IM (Completed)   Encounter for special screening examination for cardiovascular disorder       Relevant Orders   Lipid panel   Need  for hepatitis C screening test       Relevant Orders   Hepatitis C antibody   Encounter for screening for HIV       Relevant Orders   HIV Antibody (routine testing w rflx)       Return in about 1 year (around 06/28/2021) for annual exam.  Lesleigh Noe, MD  This visit occurred during the SARS-CoV-2 public health emergency.  Safety protocols were in place, including screening questions prior to the visit, additional usage of staff PPE, and extensive cleaning of exam room while observing appropriate contact time as indicated for disinfecting solutions.

## 2020-06-28 NOTE — Patient Instructions (Signed)
Great to see you!  Make sure you have an appointment with your GI specialist

## 2020-06-29 LAB — HEPATITIS C ANTIBODY
Hepatitis C Ab: NONREACTIVE
SIGNAL TO CUT-OFF: 0.01 (ref <1.00)

## 2020-06-29 LAB — HIV ANTIBODY (ROUTINE TESTING W REFLEX): HIV 1&2 Ab, 4th Generation: NONREACTIVE

## 2020-08-02 ENCOUNTER — Other Ambulatory Visit: Payer: Self-pay | Admitting: Family Medicine

## 2020-08-06 NOTE — Telephone Encounter (Signed)
Pharmacy requests refill on: Albuterol 108 mcg/act inhaler   LAST REFILL: 06/28/2020 (Q-8g, R-1) LAST OV: 06/28/2020 NEXT OV: Not Scheduled  PHARMACY: [place], [place]

## 2020-08-24 ENCOUNTER — Other Ambulatory Visit: Payer: Self-pay | Admitting: Gastroenterology

## 2020-08-24 DIAGNOSIS — K219 Gastro-esophageal reflux disease without esophagitis: Secondary | ICD-10-CM | POA: Diagnosis not present

## 2020-08-24 DIAGNOSIS — K7030 Alcoholic cirrhosis of liver without ascites: Secondary | ICD-10-CM | POA: Diagnosis not present

## 2020-08-24 DIAGNOSIS — Z86010 Personal history of colonic polyps: Secondary | ICD-10-CM | POA: Diagnosis not present

## 2020-08-25 ENCOUNTER — Other Ambulatory Visit: Payer: Self-pay | Admitting: Family Medicine

## 2020-09-14 ENCOUNTER — Ambulatory Visit
Admission: RE | Admit: 2020-09-14 | Discharge: 2020-09-14 | Disposition: A | Discharge status: Home / Self Care | Payer: BC Managed Care – PPO | Source: Ambulatory Visit | Attending: Gastroenterology | Admitting: Gastroenterology

## 2020-09-14 ENCOUNTER — Other Ambulatory Visit: Payer: Self-pay

## 2020-09-14 DIAGNOSIS — K7460 Unspecified cirrhosis of liver: Secondary | ICD-10-CM | POA: Diagnosis not present

## 2020-09-14 DIAGNOSIS — K7030 Alcoholic cirrhosis of liver without ascites: Secondary | ICD-10-CM

## 2020-09-17 ENCOUNTER — Other Ambulatory Visit: Payer: Self-pay | Admitting: Family Medicine

## 2020-09-17 DIAGNOSIS — G629 Polyneuropathy, unspecified: Secondary | ICD-10-CM

## 2020-10-24 ENCOUNTER — Telehealth: Payer: Self-pay

## 2020-10-24 NOTE — Telephone Encounter (Signed)
Per DPR left v/m directing pt to cb to [facility] for appt.

## 2020-10-24 NOTE — Telephone Encounter (Signed)
[place] Night - Client Nonclinical Telephone Record AccessNurse Client [place] Night - Client Client Site [place] Physician Waunita Schooner- MD Contact Type Call Who Is Calling Patient / Member / Family / Caregiver Caller Name [place] Phone Number [phone] Patient Name Morgan Sanders Patient DOB May 25, 1976 Call Type Message Only Information Provided Reason for Call Request to Schedule Office Appointment Initial Comment Caller states she would like to schedule an appt. Additional Comment Provided office hours. Caller declined triage. Disp. Time Disposition Final User 10/23/2020 6:59:36 PM General Information Provided Yes Silvestre Moment Call Closed By: Silvestre Moment Transaction Date/Time: 10/23/2020 6:56:51 PM (ET)

## 2020-10-24 NOTE — Telephone Encounter (Signed)
Called patient and scheduled visit 3/14

## 2020-10-29 ENCOUNTER — Ambulatory Visit: Payer: BC Managed Care – PPO | Admitting: Family Medicine

## 2020-10-30 ENCOUNTER — Other Ambulatory Visit: Payer: Self-pay | Admitting: Family Medicine

## 2020-10-30 DIAGNOSIS — G629 Polyneuropathy, unspecified: Secondary | ICD-10-CM

## 2020-10-31 ENCOUNTER — Ambulatory Visit: Payer: BC Managed Care – PPO | Admitting: Family Medicine

## 2020-11-12 ENCOUNTER — Other Ambulatory Visit: Payer: Self-pay | Admitting: Family Medicine

## 2020-11-12 DIAGNOSIS — Z1231 Encounter for screening mammogram for malignant neoplasm of breast: Secondary | ICD-10-CM

## 2020-11-19 ENCOUNTER — Telehealth: Payer: Self-pay | Admitting: *Deleted

## 2020-11-19 NOTE — Telephone Encounter (Signed)
Patient called stating that she is has been having a lot of problems with her allergies for the past month and a half. Patient stated that she has been taking Claritin and using her inhaler. Patient stated that she has started a new job and there are a lot of pine trees around. Patient stated that she has had a cough and head congestion for about two weeks and does not feel that the Claritin is working for her. Patient stated that she is wondering if Dr. Einar Pheasant can recommend something different for her allergies. Patient denies a fever or SOB.  Ph[facility]acy [facility]

## 2020-11-19 NOTE — Telephone Encounter (Signed)
If having congestion can try switching to Claritin - D  Or just adding pseudoephedrine  I also think Flonase nasal spray would be helpful

## 2020-11-21 NOTE — Telephone Encounter (Signed)
Spoke with pt and relayed dr. Verda Cumins suggestions. Pt expressed understanding and appreciation.

## 2020-12-05 ENCOUNTER — Other Ambulatory Visit: Payer: Self-pay | Admitting: Family Medicine

## 2020-12-05 DIAGNOSIS — Z23 Encounter for immunization: Secondary | ICD-10-CM

## 2020-12-05 DIAGNOSIS — F331 Major depressive disorder, recurrent, moderate: Secondary | ICD-10-CM

## 2020-12-06 ENCOUNTER — Other Ambulatory Visit: Payer: Self-pay | Admitting: Family Medicine

## 2020-12-06 DIAGNOSIS — Z23 Encounter for immunization: Secondary | ICD-10-CM

## 2020-12-11 ENCOUNTER — Telehealth: Payer: Self-pay | Admitting: Family Medicine

## 2020-12-11 ENCOUNTER — Other Ambulatory Visit: Payer: Self-pay | Admitting: Family Medicine

## 2020-12-11 DIAGNOSIS — Z23 Encounter for immunization: Secondary | ICD-10-CM

## 2020-12-11 DIAGNOSIS — F331 Major depressive disorder, recurrent, moderate: Secondary | ICD-10-CM

## 2020-12-11 NOTE — Telephone Encounter (Signed)
Spoke to pt and told her that the 2 medications that have been denied should have refills [facility]. Pt is going to call them.

## 2020-12-11 NOTE — Telephone Encounter (Signed)
Pt called in wanted to know about getting her medication refilled , she has 5/6 off but Dr. Einar Pheasant is off and I scheduled her for Damita Dunnings on 5/6 @4 .

## 2020-12-12 ENCOUNTER — Other Ambulatory Visit: Payer: Self-pay | Admitting: Family Medicine

## 2020-12-12 DIAGNOSIS — F331 Major depressive disorder, recurrent, moderate: Secondary | ICD-10-CM

## 2020-12-12 DIAGNOSIS — Z23 Encounter for immunization: Secondary | ICD-10-CM

## 2020-12-12 NOTE — Telephone Encounter (Signed)
[facility] and refill of xanax was pended for Dr. Einar Pheasant to approve and send.

## 2020-12-12 NOTE — Telephone Encounter (Signed)
Pharmacy is saying there never got our faxed Rx for this. Please resend to them.

## 2020-12-12 NOTE — Telephone Encounter (Signed)
Pt called to report pharmacy told her that they did not receive Rx for Xanax and she does not have refills left for Lexapro as they have been giving her 30 days at a time vs 90 days... pt is going out of town after 1pm and will need her Rx to take with her... Please advise

## 2020-12-21 ENCOUNTER — Ambulatory Visit: Payer: BC Managed Care – PPO | Admitting: Family Medicine

## 2020-12-21 ENCOUNTER — Encounter: Payer: Self-pay | Admitting: Family Medicine

## 2020-12-21 ENCOUNTER — Other Ambulatory Visit: Payer: Self-pay

## 2020-12-21 ENCOUNTER — Ambulatory Visit
Admission: RE | Admit: 2020-12-21 | Discharge: 2020-12-21 | Disposition: A | Discharge status: Home / Self Care | Payer: BC Managed Care – PPO | Source: Ambulatory Visit | Attending: Family Medicine | Admitting: Family Medicine

## 2020-12-21 DIAGNOSIS — F411 Generalized anxiety disorder: Secondary | ICD-10-CM

## 2020-12-21 DIAGNOSIS — T7840XD Allergy, unspecified, subsequent encounter: Secondary | ICD-10-CM | POA: Diagnosis not present

## 2020-12-21 DIAGNOSIS — L920 Granuloma annulare: Secondary | ICD-10-CM | POA: Diagnosis not present

## 2020-12-21 DIAGNOSIS — Z1231 Encounter for screening mammogram for malignant neoplasm of breast: Secondary | ICD-10-CM | POA: Diagnosis not present

## 2020-12-21 DIAGNOSIS — D225 Melanocytic nevi of trunk: Secondary | ICD-10-CM | POA: Diagnosis not present

## 2020-12-21 DIAGNOSIS — Z1283 Encounter for screening for malignant neoplasm of skin: Secondary | ICD-10-CM | POA: Diagnosis not present

## 2020-12-21 NOTE — Progress Notes (Signed)
This visit occurred during the SARS-CoV-2 public health emergency.  Safety protocols were in place, including screening questions prior to the visit, additional usage of staff PPE, and extensive cleaning of exam room while observing appropriate contact time as indicated for disinfecting solutions.  Springtime with inc SABA use, much less the rest of the year.  She'll update [place] if use/need for SABA doesn't decrease soon.   Mood d/w pt.  Stressors d/w pt.  Compliant with lexapro.  Used BZD prn, depending on family stressors (her daughter who lives out of town has had difficulties and that is a significant source of stress for the patient).  Inc in lexapro helped with mood.  No ADE on med.  No BZD use in last few days.  Not sedated.  No SI/HI.  No alcohol use.  She does not need a refill on benzodiazepine at this point.  Meds, vitals, and allergies reviewed.   ROS: Per HPI unless specifically indicated in ROS section   GEN: nad, alert and oriented HEENT: Normocephalic/atraumatic NECK: supple w/o LA CV: rrr.  PULM: ctab, no inc wob ABD: soft, +bs EXT: no edema SKIN: no acute rash Speech normal.  Judgment intact.  Not sedated.

## 2020-12-21 NOTE — Patient Instructions (Addendum)
I'll update Dr. Einar Pheasant.  Let me check with her about xanax refills for the future.   Don't change your meds for now.  Take care.  Glad to see you.

## 2020-12-23 NOTE — Assessment & Plan Note (Signed)
I will update PCP.  She did not need a refill of benzodiazepines at this point.  Not drinking.  Increase in Lexapro did help and she is using benzodiazepine intermittently, depending on stressors related to her daughter who lives out of town.  Still okay for outpatient follow-up.  Safe at home.  I will update PCP.  Given that the increased dose of Lexapro did help, and since she already had enough benzodiazepine to use on as needed basis for the near future, I did not change her medications and I will defer to PCP about when to have follow-up.

## 2020-12-23 NOTE — Assessment & Plan Note (Signed)
If she continues to need albuterol frequently then we can change her to a maintenance inhaler.  Discussed with patient.  She will update [place] as needed.

## 2020-12-24 ENCOUNTER — Telehealth: Payer: Self-pay

## 2020-12-24 NOTE — Telephone Encounter (Signed)
Patient called and stated that she had a mammogram completed on 5/6 and the results showed that she had a mass in her R breast. Patient stated that she called the breast center and they stated that she would need to call her PCP to set up the additional tests needed. Please advise.

## 2020-12-24 NOTE — Telephone Encounter (Signed)
Routing to referral coordinators to check with breast center to see what additional information they need.   Per their mammogram  "RECOMMENDATION: Diagnostic mammogram and possibly ultrasound of the right breast. (Code:[id number])  The patient will be contacted regarding the findings, and additional imaging will be scheduled."

## 2020-12-25 ENCOUNTER — Other Ambulatory Visit: Payer: Self-pay | Admitting: Family Medicine

## 2020-12-25 DIAGNOSIS — N6310 Unspecified lump in the right breast, unspecified quadrant: Secondary | ICD-10-CM

## 2020-12-25 DIAGNOSIS — R928 Other abnormal and inconclusive findings on diagnostic imaging of breast: Secondary | ICD-10-CM

## 2020-12-25 NOTE — Telephone Encounter (Signed)
Per Morgan Sanders from [facility] placed orders for Dr Einar Pheasant to co sign so they can call and schedule patient.

## 2020-12-28 ENCOUNTER — Other Ambulatory Visit: Payer: Self-pay

## 2020-12-28 ENCOUNTER — Ambulatory Visit
Admission: RE | Admit: 2020-12-28 | Discharge: 2020-12-28 | Disposition: A | Discharge status: Home / Self Care | Payer: BC Managed Care – PPO | Source: Ambulatory Visit | Attending: Family Medicine | Admitting: Family Medicine

## 2020-12-28 DIAGNOSIS — R928 Other abnormal and inconclusive findings on diagnostic imaging of breast: Secondary | ICD-10-CM | POA: Insufficient documentation

## 2020-12-28 DIAGNOSIS — N6310 Unspecified lump in the right breast, unspecified quadrant: Secondary | ICD-10-CM | POA: Insufficient documentation

## 2020-12-28 DIAGNOSIS — R922 Inconclusive mammogram: Secondary | ICD-10-CM | POA: Diagnosis not present

## 2020-12-31 ENCOUNTER — Other Ambulatory Visit: Payer: Self-pay | Admitting: Family Medicine

## 2020-12-31 DIAGNOSIS — R928 Other abnormal and inconclusive findings on diagnostic imaging of breast: Secondary | ICD-10-CM

## 2020-12-31 DIAGNOSIS — N6310 Unspecified lump in the right breast, unspecified quadrant: Secondary | ICD-10-CM

## 2021-01-01 ENCOUNTER — Other Ambulatory Visit: Payer: Self-pay

## 2021-01-01 ENCOUNTER — Ambulatory Visit
Admission: RE | Admit: 2021-01-01 | Discharge: 2021-01-01 | Disposition: A | Discharge status: Home / Self Care | Payer: BC Managed Care – PPO | Source: Ambulatory Visit | Attending: Family Medicine | Admitting: Family Medicine

## 2021-01-01 DIAGNOSIS — N6310 Unspecified lump in the right breast, unspecified quadrant: Secondary | ICD-10-CM

## 2021-01-01 DIAGNOSIS — R928 Other abnormal and inconclusive findings on diagnostic imaging of breast: Secondary | ICD-10-CM

## 2021-01-01 DIAGNOSIS — N6315 Unspecified lump in the right breast, overlapping quadrants: Secondary | ICD-10-CM | POA: Diagnosis not present

## 2021-01-01 DIAGNOSIS — C50811 Malignant neoplasm of overlapping sites of right female breast: Secondary | ICD-10-CM | POA: Diagnosis not present

## 2021-01-01 HISTORY — PX: BREAST BIOPSY: SHX20

## 2021-01-02 ENCOUNTER — Telehealth: Payer: Self-pay

## 2021-01-02 NOTE — Progress Notes (Signed)
Navigation initiated.  Patient will be scheduled with Dr. Janese Banks, and [facility] per request.

## 2021-01-02 NOTE — Telephone Encounter (Signed)
Pt left v/m that she does not think she has enough xanax to get thru the weekend; pt said they just called her and told her she has CA. I spoke with Joe at [facility] and pt does have one refill of xanax and Joe will get ready for pick up. I spoke with pt and advised that Wille Glaser would get refill ready for pick up of xanax. Pt was appreciative but she was crying I asked if she was by herself and she said they called her at work to tell her she had CA and she is on her way home now. I asked if she could drive ok or could I call someone to pick her up; pt calmed down and said she could drive. Will send note to Dr Fayette Pho.

## 2021-01-03 DIAGNOSIS — C50919 Malignant neoplasm of unspecified site of unspecified female breast: Secondary | ICD-10-CM

## 2021-01-03 NOTE — Telephone Encounter (Signed)
Called patient to check in  Is stressed but the xanax is helping her relax  Has an appointment 5/23 with oncology

## 2021-01-04 NOTE — Progress Notes (Signed)
Notified patient of  consult with Dr. Janese Banks on 01/07/21, and Dr. Celine Ahr on 01/08/21.   ER/PR and HER2 results still pending.

## 2021-01-07 ENCOUNTER — Inpatient Hospital Stay: Payer: BC Managed Care – PPO | Attending: Oncology | Admitting: Oncology

## 2021-01-07 ENCOUNTER — Other Ambulatory Visit: Payer: Self-pay

## 2021-01-07 ENCOUNTER — Encounter: Payer: Self-pay | Admitting: Oncology

## 2021-01-07 VITALS — BP 130/96 | HR 81 | Temp 98.4°F | Resp 20 | Ht 67.72 in | Wt 192.2 lb

## 2021-01-07 DIAGNOSIS — Z801 Family history of malignant neoplasm of trachea, bronchus and lung: Secondary | ICD-10-CM | POA: Diagnosis not present

## 2021-01-07 DIAGNOSIS — Z803 Family history of malignant neoplasm of breast: Secondary | ICD-10-CM

## 2021-01-07 DIAGNOSIS — C50911 Malignant neoplasm of unspecified site of right female breast: Secondary | ICD-10-CM | POA: Diagnosis not present

## 2021-01-07 DIAGNOSIS — Z808 Family history of malignant neoplasm of other organs or systems: Secondary | ICD-10-CM | POA: Diagnosis not present

## 2021-01-07 DIAGNOSIS — Z170 Estrogen receptor positive status [ER+]: Secondary | ICD-10-CM | POA: Insufficient documentation

## 2021-01-07 DIAGNOSIS — Z7189 Other specified counseling: Secondary | ICD-10-CM

## 2021-01-07 DIAGNOSIS — Z87891 Personal history of nicotine dependence: Secondary | ICD-10-CM | POA: Diagnosis not present

## 2021-01-07 NOTE — Progress Notes (Signed)
Hematology/Oncology Consult note [facility]  [phone] [phone]  Patient Care Team: Lesleigh Noe, MD as PCP - General (Family Medicine) Reeves Forth (Gastroenterology) Sharlet Salina, MD as Referring Physician (Physical Medicine and Rehabilitation) Maximiano Coss, Loraine Grip, MD (Neurology) Theodore Demark, RN as Oncology Nurse Navigator   Name of the patient: Morgan Sanders  [id number]  1976/03/10   Date of visit: 01/07/21  Diagnosis-history of normocytic anemia now referred for new diagnosis of breast cancer  Chief complaint/ Reason for visit-history of prior anemia now referred for new diagnosis of breast cancer  Heme/Onc history: Patient is a 45 year old female who was seen by me 2 years ago for normocytic anemia.  At that time she was found to have E42 and folic acid deficiency.  She had also undergone FibroSure test which confirmed alcohol-related cirrhosis.  Anemia work-up was otherwise unremarkable.Patient had a bone marrow biopsy back in May 2020 as well which did not show any evidence of lymphoproliferative neoplasm or malignancy and did not reveal a cause of her anemia.  Patient now referred for new diagnosis of breast cancer  Patient underwent a bilateral screening mammogram on 12/21/2020 which showed possible mass in the right breast.  This was followed by diagnostic mammogram and ultrasound which showed 2 masses in the right breast at 12 o'clock position each measuring 6 mm and 1.2 cm apart.  Ultrasound of the right axilla was negative for malignancy.  Patient underwent biopsy of both the medial and the lateral breast mass and it was consistent with invasive mammary carcinoma grade 2.  ER/PR and HER2 is currently pending  Family history significant for breast cancer in her maternal grandmother in her 90s.  Maternal great aunt died of breast cancer.  Father had liver cancer.  LMP at the age of 84.  She has previously used  birth control pills for 40 years.  She is G2, P2 L2.  Menarche at the age of 70.  Age at first birth 35.  Interval history-patient currently reports doing well and denies any specific complaints at this time  ECOG PS- 0 Pain scale- 0   Review of systems- Review of Systems  Constitutional: Negative for chills, fever, malaise/fatigue and weight loss.  HENT: Negative for congestion, ear discharge and nosebleeds.   Eyes: Negative for blurred vision.  Respiratory: Negative for cough, hemoptysis, sputum production, shortness of breath and wheezing.   Cardiovascular: Negative for chest pain, palpitations, orthopnea and claudication.  Gastrointestinal: Negative for abdominal pain, blood in stool, constipation, diarrhea, heartburn, melena, nausea and vomiting.  Genitourinary: Negative for dysuria, flank pain, frequency, hematuria and urgency.  Musculoskeletal: Negative for back pain, joint pain and myalgias.  Skin: Negative for rash.  Neurological: Negative for dizziness, tingling, focal weakness, seizures, weakness and headaches.  Endo/Heme/Allergies: Does not bruise/bleed easily.  Psychiatric/Behavioral: Negative for depression and suicidal ideas. The patient does not have insomnia.       No Known Allergies   Past Medical History:  Diagnosis Date  . Allergy   . Anemia   . Asthma   . Blood transfusion without reported diagnosis   . Chicken pox   . Colon polyps   . Depression   . Heart murmur   . Hypertension   . Neuromuscular disorder (HCC)    neuropathy  . Stomach ulcer      Past Surgical History:  Procedure Laterality Date  . BREAST BIOPSY Right 01/01/2021   u/s bx 12:00 1 cmfn  path pending x clip lateral  . BREAST BIOPSY Right 01/01/2021   u/s bx 12:00 1cmfn path pending vision medial  . CHOLECYSTECTOMY    . CHOLECYSTECTOMY, LAPAROSCOPIC  03/1996  . ESOPHAGOGASTRODUODENOSCOPY (EGD) WITH PROPOFOL N/A 12/31/2018   Procedure: ESOPHAGOGASTRODUODENOSCOPY (EGD) WITH PROPOFOL;   Surgeon: Lucilla Lame, MD;  Location: [facility] ENDOSCOPY;  Service: Endoscopy;  Laterality: N/A;  . TUBAL LIGATION      Social History   Socioeconomic History  . Marital status: Married    Spouse name: [name]  . Number of children: 2  . Years of education: high school  . Highest education level: 12th grade  Occupational History  . Occupation: Golf/clubhouse attendent  Tobacco Use  . Smoking status: Former Smoker    Packs/day: 1.00    Years: 20.00    Pack years: 20.00    Types: Cigarettes  . Smokeless tobacco: Never Used  Vaping Use  . Vaping Use: Some days  Substance and Sexual Activity  . Alcohol use: Yes    Comment: a few times a year  . Drug use: Never  . Sexual activity: Not Currently  Other Topics Concern  . Not on file  Social History Narrative   02/07/20   From: the area   Living: with husband, [name] (2000)   Work: at a golf course      Family: daughters - CJ (lives at [employer], 2 children) and Art gallery manager (still living at home, in Sports coach school)      Enjoys: golf       Exercise: golfing 3-4 times a week - alternating between cart/walking   Diet: pretty good - eggs/fruit, eats lunch and dinner      Safety   Seat belts: Yes    Guns: Yes  and secure   Safe in relationships: Yes    Social Determinants of Radio broadcast assistant Strain: Not on file  Food Insecurity: Not on file  Transportation Needs: Not on file  Physical Activity: Not on file  Stress: Not on file  Social Connections: Not on file  Intimate Partner Violence: Not on file    Family History  Problem Relation Age of Onset  . Breast cancer Maternal Grandmother 78  . Arthritis Maternal Grandmother   . Cancer Maternal Grandmother   . Diabetes Maternal Grandmother   . Hearing loss Maternal Grandmother   . Hypertension Maternal Grandmother   . Bone cancer Maternal Great-grandmother   . Hypertension Mother   . Diabetes Mother   . Hyperlipidemia Mother   . Kidney disease Mother   . Arthritis  Mother   . Asthma Mother   . Depression Mother   . Sudden death Father        hit by train  . Liver disease Father        hx of liver transplant  . Early death Father   . Cataracts Paternal Grandfather   . Lung cancer Paternal Grandfather   . Hyperlipidemia Sister   . Hypertension Sister   . Depression Daughter   . Early death Maternal Grandfather   . Depression Daughter      Current Outpatient Medications:  .  albuterol (VENTOLIN HFA) 108 (90 Base) MCG/ACT inhaler, INHALE 2 PUFFS INTO THE LUNGS EVERY 4 (FOUR) HOURS AS NEEDED FOR SHORTNESS OF BREATH OR WHEEZING, Disp: 8.5 each, Rfl: 2 .  ALPRAZolam (XANAX) 0.5 MG tablet, TAKE 1 TABLET BY MOUTH AS NEEDED FOR ANXIETY., Disp: 10 tablet, Rfl: 1 .  Biotin w/ Vitamins C &  E (HAIR/SKIN/NAILS PO), Take 1 tablet by mouth daily., Disp: , Rfl:  .  Cholecalciferol (VITAMIN D3) 125 MCG (5000 UT) CAPS, Take 1 capsule by mouth daily., Disp: , Rfl:  .  escitalopram (LEXAPRO) 20 MG tablet, TAKE 1 TABLET BY MOUTH EVERY DAY, Disp: 90 tablet, Rfl: 3 .  famotidine (PEPCID) 20 MG tablet, Take 20 mg by mouth daily. , Disp: , Rfl:  .  loratadine (CLARITIN) 10 MG tablet, Take 10 mg by mouth daily., Disp: , Rfl:  .  MAG-G 500 (27 Mg) MG TABS, TAKE 1 TABLET ($RemoveB'500MG'MLBEgUgX$ ) BY MOUTH EVERY DAY, Disp: 30 tablet, Rfl: 3 .  Potassium Chloride ER 20 MEQ TBCR, TAKE 1 TABLET BY MOUTH EVERY DAY, Disp: 90 tablet, Rfl: 1 .  pregabalin (LYRICA) 100 MG capsule, Take 200 mg by mouth 2 (two) times daily., Disp: , Rfl:  .  pyridOXINE (VITAMIN B-6) 100 MG tablet, Take 100 mg by mouth daily., Disp: , Rfl:  .  thiamine (VITAMIN B-1) 100 MG tablet, Take 250 mg by mouth daily. , Disp: , Rfl:  .  vitamin B-12 (CYANOCOBALAMIN) 500 MCG tablet, Take 2,500 mcg by mouth daily., Disp: , Rfl:   Physical exam:  Vitals:   01/07/21 1146  BP: (!) 130/96  Pulse: 81  Resp: 20  Temp: 98.4 F (36.9 C)  TempSrc: Tympanic  SpO2: 98%  Weight: 192 lb 3.2 oz (87.2 kg)  Height: 5' 7.72" (1.72 m)    Physical Exam Constitutional:      General: She is not in acute distress. Cardiovascular:     Rate and Rhythm: Normal rate and regular rhythm.     Heart sounds: Normal heart sounds.  Pulmonary:     Effort: Pulmonary effort is normal.     Breath sounds: Normal breath sounds.  Abdominal:     General: Bowel sounds are normal.     Palpations: Abdomen is soft.  Skin:    General: Skin is warm and dry.  Neurological:     Mental Status: She is alert and oriented to person, place, and time.      CMP Latest Ref Rng & Units 06/28/2020  Glucose 70 - 99 mg/dL 98  BUN 6 - 23 mg/dL 18  Creatinine 0.40 - 1.20 mg/dL 0.89  Sodium 135 - 145 mEq/L 136  Potassium 3.5 - 5.1 mEq/L 4.2  Chloride 96 - 112 mEq/L 102  CO2 19 - 32 mEq/L 27  Calcium 8.4 - 10.5 mg/dL 9.4  Total Protein 6.0 - 8.3 g/dL 7.5  Total Bilirubin 0.2 - 1.2 mg/dL 0.5  Alkaline Phos 39 - 117 U/L 87  AST 0 - 37 U/L 38(H)  ALT 0 - 35 U/L 38(H)   CBC Latest Ref Rng & Units 06/28/2020  WBC 4.0 - 10.5 K/uL 5.5  Hemoglobin 12.0 - 15.0 g/dL 13.1  Hematocrit 36.0 - 46.0 % 37.8  Platelets 150.0 - 400.0 K/uL 106.0(L)    No images are attached to the encounter.  US BREAST LTD UNI RIGHT INC AXILLA  Result Date: 12/28/2020 CLINICAL DATA:  Callback from screening mammogram for possible mass right breast EXAM: DIGITAL DIAGNOSTIC UNILATERAL RIGHT MAMMOGRAM WITH TOMOSYNTHESIS AND CAD; ULTRASOUND RIGHT BREAST LIMITED TECHNIQUE: Right digital diagnostic mammography and breast tomosynthesis was performed. The images were evaluated with computer-aided detection.; Targeted ultrasound examination of the right breast was performed COMPARISON:  Prior films ACR Breast Density Category b: There are scattered areas of fibroglandular density. FINDINGS: Lateral view of right breast, spot compression cc and MLO  views of the right breast are submitted. There is a persistent spiculated mass in the retroareolar central anterior to mid depth right breast.  Targeted ultrasound is performed, showing there are 2 irregular hypoechoic masses at the right breast 12 o'clock 1 cm from nipple, correlating to the mammographic finding. Each of the masses measured 6 mm in maximum dimension. The 2 masses are 1.2 cm apart. Ultrasound of the right axilla is negative. IMPRESSION: Highly suspicious findings. RECOMMENDATION: Ultrasound-guided core biopsies of the 2 masses at right breast 12 o'clock. I have discussed the findings and recommendations with the patient. If applicable, a reminder letter will be sent to the patient regarding the next appointment. BI-RADS CATEGORY  5: Highly suggestive of malignancy. Electronically Signed   By: Abelardo Diesel M.D.   On: 12/28/2020 14:57   MM DIAG BREAST TOMO UNI RIGHT  Result Date: 12/28/2020 CLINICAL DATA:  Callback from screening mammogram for possible mass right breast EXAM: DIGITAL DIAGNOSTIC UNILATERAL RIGHT MAMMOGRAM WITH TOMOSYNTHESIS AND CAD; ULTRASOUND RIGHT BREAST LIMITED TECHNIQUE: Right digital diagnostic mammography and breast tomosynthesis was performed. The images were evaluated with computer-aided detection.; Targeted ultrasound examination of the right breast was performed COMPARISON:  Prior films ACR Breast Density Category b: There are scattered areas of fibroglandular density. FINDINGS: Lateral view of right breast, spot compression cc and MLO views of the right breast are submitted. There is a persistent spiculated mass in the retroareolar central anterior to mid depth right breast. Targeted ultrasound is performed, showing there are 2 irregular hypoechoic masses at the right breast 12 o'clock 1 cm from nipple, correlating to the mammographic finding. Each of the masses measured 6 mm in maximum dimension. The 2 masses are 1.2 cm apart. Ultrasound of the right axilla is negative. IMPRESSION: Highly suspicious findings. RECOMMENDATION: Ultrasound-guided core biopsies of the 2 masses at right breast 12 o'clock. I have  discussed the findings and recommendations with the patient. If applicable, a reminder letter will be sent to the patient regarding the next appointment. BI-RADS CATEGORY  5: Highly suggestive of malignancy. Electronically Signed   By: Abelardo Diesel M.D.   On: 12/28/2020 14:57   MM 3D SCREEN BREAST BILATERAL  Result Date: 12/21/2020 CLINICAL DATA:  Screening. EXAM: DIGITAL SCREENING BILATERAL MAMMOGRAM WITH TOMOSYNTHESIS AND CAD TECHNIQUE: Bilateral screening digital craniocaudal and mediolateral oblique mammograms were obtained. Bilateral screening digital breast tomosynthesis was performed. The images were evaluated with computer-aided detection. COMPARISON:  Previous exam(s). ACR Breast Density Category b: There are scattered areas of fibroglandular density. FINDINGS: In the right breast, a possible mass warrants further evaluation. In the left breast, no findings suspicious for malignancy. IMPRESSION: Further evaluation is suggested for a possible mass in the right breast. RECOMMENDATION: Diagnostic mammogram and possibly ultrasound of the right breast. (Code:[id number]-44M) The patient will be contacted regarding the findings, and additional imaging will be scheduled. BI-RADS CATEGORY  0: Incomplete. Need additional imaging evaluation and/or prior mammograms for comparison. Electronically Signed   By: Nolon Nations M.D.   On: 12/21/2020 16:12   MM CLIP PLACEMENT RIGHT  Result Date: 01/01/2021 CLINICAL DATA:  Patient status post ultrasound-guided core needle biopsy right breast masses. EXAM: DIAGNOSTIC RIGHT MAMMOGRAM POST ULTRASOUND BIOPSY COMPARISON:  Previous exam(s). FINDINGS: Mammographic images were obtained following ultrasound guided biopsy of right breast masses. Site 1: Right breast mass 12 o'clock position 1 cm from the nipple lateral: X shaped clip: In appropriate position. Site 2: Right breast mass 12 o'clock position 1 cm from the nipple medial: Vision  clip: In appropriate position. IMPRESSION:  Appropriate position biopsy marking clips as above. Final Assessment: Post Procedure Mammograms for Marker Placement Electronically Signed   By: Lovey Newcomer M.D.   On: 01/01/2021 12:05   [place] RT BREAST BX W LOC DEV 1ST LESION IMG BX SPEC US GUIDE  Addendum Date: 01/04/2021   ADDENDUM REPORT: 01/02/2021 13:38 ADDENDUM: PATHOLOGY revealed: Site A. RIGHT BREAST, LATERAL; ULTRASOUND-GUIDED NEEDLE CORE BIOPSY: - INVASIVE MAMMARY CARCINOMA, NO SPECIAL TYPE. Size of invasive carcinoma: 5 mm in this sample. Histologic grade of invasive carcinoma: Grade 2. Ductal carcinoma in situ: Not identified. Lymphovascular invasion: Not identified. Pathology results are CONCORDANT with imaging findings, per Dr. Lovey Newcomer. PATHOLOGY revealed: Site B. RIGHT BREAST, MEDIAL; ULTRASOUND-GUIDED NEEDLE CORE BIOPSY: - INVASIVE MAMMARY CARCINOMA, AS DESCRIBED ABOVE (SEE PART A). Pathology results are CONCORDANT with imaging findings, per Dr. Lovey Newcomer. Pathology results and recommendations below were discussed with patient by telephone on 01/02/2021. Patient reported biopsy site within normal limits with slight tenderness at the site. Post biopsy care instructions were reviewed, questions were answered and my direct phone number was provided to patient. Patient was instructed to call [facility] if any concerns or questions arise related to the biopsy. Recommend surgical consultation: Request for surgical consultation relayed to Al Pimple RN and Tanya Nones RN at [facility] by Electa Sniff RN on 01/02/2021. Recommend bilateral breast MRI. Pathology results reported by Electa Sniff RN on 01/02/2021. Electronically Signed   By: Lovey Newcomer M.D.   On: 01/02/2021 13:38   Result Date: 01/04/2021 CLINICAL DATA:  Patient with indeterminate right breast masses 12 o'clock position, for biopsy. EXAM: ULTRASOUND GUIDED RIGHT BREAST CORE NEEDLE BIOPSY COMPARISON:  Previous exam(s). PROCEDURE: I met with the patient and  we discussed the procedure of ultrasound-guided biopsy, including benefits and alternatives. We discussed the high likelihood of a successful procedure. We discussed the risks of the procedure, including infection, bleeding, tissue injury, clip migration, and inadequate sampling. Informed written consent was given. The usual time-out protocol was performed immediately prior to the procedure. Site 1: Right breast mass 12 o'clock position 1 cm from the nipple lateral: Lesion quadrant: Upper outer quadrant Using sterile technique and 1% Lidocaine as local anesthetic, under direct ultrasound visualization, a 14 gauge spring-loaded device was used to perform biopsy of right breast mass 12 o'clock position 1 cm from the nipple lateral using a lateral approach. At the conclusion of the procedure X shaped tissue marker clip was deployed into the biopsy cavity. Follow up 2 view mammogram was performed and dictated separately. Site 2: Right breast mass 12 o'clock position 1 cm from the nipple medial: Lesion quadrant: Upper inner quadrant Using sterile technique and 1% Lidocaine as local anesthetic, under direct ultrasound visualization, a 14 gauge spring-loaded device was used to perform biopsy of right breast mass 12 o'clock position 1 cm from the nipple medial using a lateral approach. At the conclusion of the procedure vision shaped tissue marker clip was deployed into the biopsy cavity. Follow up 2 view mammogram was performed and dictated separately. IMPRESSION: Ultrasound guided biopsy of right breast masses as above. No apparent complications. Electronically Signed: By: Lovey Newcomer M.D. On: 01/01/2021 12:10   [place] RT BREAST BX W LOC DEV EA ADD LESION IMG BX SPEC US GUIDE  Addendum Date: 01/04/2021   ADDENDUM REPORT: 01/02/2021 13:38 ADDENDUM: PATHOLOGY revealed: Site A. RIGHT BREAST, LATERAL; ULTRASOUND-GUIDED NEEDLE CORE BIOPSY: - INVASIVE MAMMARY CARCINOMA, NO SPECIAL TYPE. Size of  invasive carcinoma: 5 mm in this  sample. Histologic grade of invasive carcinoma: Grade 2. Ductal carcinoma in situ: Not identified. Lymphovascular invasion: Not identified. Pathology results are CONCORDANT with imaging findings, per Dr. Lovey Newcomer. PATHOLOGY revealed: Site B. RIGHT BREAST, MEDIAL; ULTRASOUND-GUIDED NEEDLE CORE BIOPSY: - INVASIVE MAMMARY CARCINOMA, AS DESCRIBED ABOVE (SEE PART A). Pathology results are CONCORDANT with imaging findings, per Dr. Lovey Newcomer. Pathology results and recommendations below were discussed with patient by telephone on 01/02/2021. Patient reported biopsy site within normal limits with slight tenderness at the site. Post biopsy care instructions were reviewed, questions were answered and my direct phone number was provided to patient. Patient was instructed to call [facility] if any concerns or questions arise related to the biopsy. Recommend surgical consultation: Request for surgical consultation relayed to Al Pimple RN and Tanya Nones RN at [facility] by Electa Sniff RN on 01/02/2021. Recommend bilateral breast MRI. Pathology results reported by Electa Sniff RN on 01/02/2021. Electronically Signed   By: Lovey Newcomer M.D.   On: 01/02/2021 13:38   Result Date: 01/04/2021 CLINICAL DATA:  Patient with indeterminate right breast masses 12 o'clock position, for biopsy. EXAM: ULTRASOUND GUIDED RIGHT BREAST CORE NEEDLE BIOPSY COMPARISON:  Previous exam(s). PROCEDURE: I met with the patient and we discussed the procedure of ultrasound-guided biopsy, including benefits and alternatives. We discussed the high likelihood of a successful procedure. We discussed the risks of the procedure, including infection, bleeding, tissue injury, clip migration, and inadequate sampling. Informed written consent was given. The usual time-out protocol was performed immediately prior to the procedure. Site 1: Right breast mass 12 o'clock position 1 cm from the nipple lateral: Lesion quadrant: Upper outer  quadrant Using sterile technique and 1% Lidocaine as local anesthetic, under direct ultrasound visualization, a 14 gauge spring-loaded device was used to perform biopsy of right breast mass 12 o'clock position 1 cm from the nipple lateral using a lateral approach. At the conclusion of the procedure X shaped tissue marker clip was deployed into the biopsy cavity. Follow up 2 view mammogram was performed and dictated separately. Site 2: Right breast mass 12 o'clock position 1 cm from the nipple medial: Lesion quadrant: Upper inner quadrant Using sterile technique and 1% Lidocaine as local anesthetic, under direct ultrasound visualization, a 14 gauge spring-loaded device was used to perform biopsy of right breast mass 12 o'clock position 1 cm from the nipple medial using a lateral approach. At the conclusion of the procedure vision shaped tissue marker clip was deployed into the biopsy cavity. Follow up 2 view mammogram was performed and dictated separately. IMPRESSION: Ultrasound guided biopsy of right breast masses as above. No apparent complications. Electronically Signed: By: Lovey Newcomer M.D. On: 01/01/2021 12:10     Assessment and plan- Patient is a 45 y.o. female with newly diagnosed invasive mammary carcinoma of the right breast ER/PR and HER2 currently pending  Discussed the results of the mammogram which showed 2 distinct breast masses at the 12 o'clock position approximately 6 mm in size and were 1.2 cm apart.  Right axilla was negative.  Biopsy showed grade 2 invasive mammary carcinoma.  ER/PR and HER2 is currently pending.  Discussed that for breast masses which are of the size I would recommend upfront surgery with sentinel lymph node biopsy.  She would benefit from adjuvant radiation following surgery.  If her tumor is triple negative or HER2 positive she would also need chemotherapy likely adjuvant.  If this is triple negative  perhaps neoadjuvant chemotherapy could be considered but if it is  positive then it would be adjuvant and not neoadjuvant.  If her tumor is ER/PR positive HER2 negative I would recommend Oncotype testing after final pathology is back.  Discussed what Oncotype testing is and how the results are interpreted.  For low risk group she would not benefit from adjuvant chemotherapy and it would be indicated if her Oncotype score is 26 or higher.  If her score is between 21-25 adjuvant chemotherapy could be considered given that she is less than 43 years of age.  I will tentatively see her back after her surgery to discuss final pathology results.  Treatment will be given with a curative intent.  She does not require any CT scans for staging work-up.  Given her personal history and family history of breast cancer we will also refer her for genetic counseling   Visit Diagnosis 1. Malignant neoplasm of right breast, stage 1, unspecified estrogen receptor status ([place])   2. Goals of care, counseling/discussion      Dr. Randa Evens, MD, MPH [facility] at [facility] [phone] 01/07/2021 4:44 PM

## 2021-01-08 ENCOUNTER — Ambulatory Visit: Payer: BC Managed Care – PPO | Admitting: General Surgery

## 2021-01-08 DIAGNOSIS — C50911 Malignant neoplasm of unspecified site of right female breast: Secondary | ICD-10-CM | POA: Diagnosis not present

## 2021-01-09 ENCOUNTER — Other Ambulatory Visit: Payer: Self-pay | Admitting: General Surgery

## 2021-01-09 DIAGNOSIS — Z853 Personal history of malignant neoplasm of breast: Secondary | ICD-10-CM

## 2021-01-09 NOTE — Progress Notes (Signed)
Subjective:     Patient ID: Morgan Sanders is a 45 y.o. female.  HPI  The following portions of the patient's history were reviewed and updated as appropriate.  This a new patient is here today for: office visit. She is here for breast evaluation referred by Morgan Sanders . Right breast biopsy was 01-01-21. She states she could not feel anything different in the breast prior to the mammogram with her self breast checks. She denies any breast injury. She is here with her husband, Morgan Sanders.   The patient reports that she had had significant alcohol consumption in 2020 resulting in hospitalization and identification of early cirrhosis as well as splenomegaly.  Abstinent since that time with resolution of many of the neurologic side effects.  Rare exposure to tobacco products at this time.  Children aged 19 and 19, younger still at home.  Grandchildren with the oldest child at the beach in [place]. Review of Systems  Constitutional: Negative for chills and fever.  Respiratory: Negative for cough.     No chief complaint on file.    BP 132/84   Pulse 74   Temp 36.3 C (97.4 F)   Ht 167.6 cm (_0 )   Wt 87.1 kg (192 lb)   SpO2 96%   BMI 30.99 kg/m       Past Medical History:  Diagnosis Date  . Anemia   . Asthma   . Blood transfusion without reported diagnosis   . Cardiac murmur   . Carpal tunnel syndrome   . Chicken pox   . Colon polyps   . Depression   . Hypertension   . Neuromuscular disorder (CMS-HCC)   . Stomach ulcer           Past Surgical History:  Procedure Laterality Date  . BREAST EXCISIONAL BIOPSY Right 01/01/2021  . CHOLECYSTECTOMY    . COLONOSCOPY  08/09/2018   Tubular adenoma of the colon/Repeat 46yr/TKT  . EGD  12/31/2018  . TUBAL LIGATION                OB History    Gravida  3   Para  2   Term      Preterm      AB      Living        SAB      IAB      Ectopic      Molar      Multiple       Live Births          Obstetric Comments  Age at first period 89Age of first pregnancy 89        Social History          Socioeconomic History  . Marital status: Married  Tobacco Use  . Smoking status: Current Some Day Smoker    Packs/day: 0.25    Years: 25.00    Pack years: 6.25    Types: Cigarettes  . Smokeless tobacco: Never Used  Vaping Use  . Vaping Use: Never used  Substance and Sexual Activity  . Alcohol use: Never    Comment: occ  . Drug use: Never  . Sexual activity: Not Currently       No Known Allergies  Current Medications        Current Outpatient Medications  Medication Sig Dispense Refill  . ALPRAZolam (XANAX) 0.5 MG tablet as needed    . cholecalciferol (VITAMIN D3) 5,000 unit capsule Take 1 capsule by  mouth once daily    . cyanocobalamin (VITAMIN B12) 1000 MCG tablet Take 1,000 mcg by mouth once daily    . escitalopram oxalate (LEXAPRO) 10 MG tablet Take 10 mg by mouth once daily    . famotidine (PEPCID) 20 MG tablet Take 1 tablet (20 mg total) by mouth 2 (two) times daily 180 tablet 3  . loratadine (CLARITIN) 10 mg tablet Take 10 mg by mouth once daily    . magnesium gluconate (MAG-G) 27 mg (500 mg) tablet once daily    . potassium chloride (KLOR-CON) 20 MEQ ER tablet Take 20 mEq by mouth once daily    . pregabalin (LYRICA) 50 MG capsule Take 1 capsule (50 mg total) by mouth 2 (two) times daily for 90 days 60 capsule 2  . pyridoxine, vitamin B6, (B-6) 100 MG tablet Take 100 mg by mouth once daily    . thiamine (VITAMIN B-1) 100 MG tablet Take by mouth once daily    . VENTOLIN HFA 90 mcg/actuation inhaler INHALE 2 PUFFS BY MOUTH EVERY 4 TO 6 HOURS AS NEEDED FOR SHORTNESS OF BREATH OR WHEEZING  1  . ergocalciferol, vitamin D2, 1,250 mcg (50,000 unit) capsule Take 1 capsule (50,000 Units total) by mouth once a week for 8 doses 8 capsule 0  . tiZANidine (ZANAFLEX) 4 MG tablet 1 tab po BID PRN (Patient not  taking: No sig reported) 60 tablet 11   No current facility-administered medications for this visit.           Family History  Problem Relation Age of Onset  . Asthma Mother   . Hyperlipidemia (Elevated cholesterol) Mother   . High blood pressure (Hypertension) Mother   . Heart disease Mother   . Diabetes Mother   . Kidney disease Mother   . Arthritis Mother   . Depression Mother   . Liver disease Father        history of liver transplant  . Sudden death (unexpected death due to unknown cause) Father        hit by a train  . Hyperlipidemia (Elevated cholesterol) Sister   . High blood pressure (Hypertension) Sister   . High blood pressure (Hypertension) Maternal Grandmother   . Diabetes Maternal Grandmother   . Breast cancer Maternal Grandmother 78  . Arthritis Maternal Grandmother   . Hearing loss Maternal Grandmother   . Early death Maternal Grandfather   . Cataracts Paternal Grandfather   . Lung cancer Paternal Grandfather   . Depression Daughter   . Breast cancer Maternal Aunt   . Breast cancer Maternal Aunt   . Bone cancer Maternal Great-Grandmother   . Colon cancer Neg Hx      Labs and Radiology:   Jan 01, 2021 pathology:  DIAGNOSIS:  A. RIGHT BREAST, LATERAL; ULTRASOUND-GUIDED NEEDLE CORE BIOPSY:  - INVASIVE MAMMARY CARCINOMA, NO SPECIAL TYPE.   Size of invasive carcinoma: 5 mm in this sample  Histologic grade of invasive carcinoma: Grade 2            Glandular/tubular differentiation score: 3            Nuclear pleomorphism score: 2            Mitotic rate score: 1            Total score: 6  Ductal carcinoma in situ: Not identified  Lymphovascular invasion: Not identified   Comment:  The definitive grade will be assigned on the excisional specimen.  ER/PR/HER2 immunohistochemistry will be performed  on block A1, with  reflex to [facility] for HER2 2+. The results will be reported  in an addendum.   B. RIGHT BREAST, MEDIAL; ULTRASOUND-GUIDED NEEDLE CORE BIOPSY:  - INVASIVE MAMMARY CARCINOMA, AS DESCRIBED ABOVE (SEE PART A).    Imaging review:  Mammograms and associated ultrasounds from May 25, 2019 through Jan 01, 2021 were independently reviewed.  Clinically relevant summary: FINDINGS: Lateral view of right breast, spot compression cc and MLO views of the right breast are submitted. There is a persistent spiculated mass in the retroareolar central anterior to mid depth right breast.  Targeted ultrasound is performed, showing there are 2 irregular hypoechoic masses at the right breast 12 o'clock 1 cm from nipple, correlating to the mammographic finding. Each of the masses measured 6 mm in maximum dimension. The 2 masses are 1.2 cm apart. Ultrasound of the right axilla is negative.  Ultrasound:  Ultrasound examination of the right breast with special attention of the retroareolar area was completed to determine if preoperative wire localization would be required.  In the 1230 o'clock position in the near retroareolar location (1 cm from the nipple) and 3 cm from the nipple are 2 hypoechoic areas with biopsy clips evident.  The tumor masses do not appear to approach within 5 mm of the overlying skin.  BI-RADS-6.  Abdominal ultrasound September 14, 2020:  IMPRESSION: 1. Stigmata of cirrhosis. No focal liver lesion identified. Please note that multiphasic contrast enhanced CT and MRI are the most sensitive tests for the screening detection of hepatocellular carcinoma.  2. Status post cholecystectomy.  Jan 01, 2019 discharge summary:  Active Problems: GIB (gastrointestinal bleeding) Anemia Intractable vomiting Acute peptic ulcer of stomach EGD of Dec 31, 2018 showed superficial gastric ulcers without bleeding.   Laboratory studies of June 28, 2020:  Sodium 135 - 145 mEq/L 136   Potassium 3.5 - 5.1 mEq/L 4.2   Chloride 96 -  112 mEq/L 102   CO2 19 - 32 mEq/L 27   Glucose, Bld 70 - 99 mg/dL 98   BUN 6 - 23 mg/dL 18   Creatinine, Ser 0.40 - 1.20 mg/dL 0.89   Total Bilirubin 0.2 - 1.2 mg/dL 0.5   Alkaline Phosphatase 39 - 117 U/L 87   AST 0 - 37 U/L 38High   ALT 0 - 35 U/L 38High   Total Protein 6.0 - 8.3 g/dL 7.5   Albumin 3.5 - 5.2 g/dL 4.8   GFR >60.00 mL/min 78.02    WBC 4.0 - 10.5 K/uL 5.5   RBC 3.87 - 5.11 Mil/uL 3.86Low   Platelets 150.0 - 400.0 K/uL 106.0Low   Hemoglobin 12.0 - 15.0 g/dL 13.1   HCT 36.0 - 46.0 % 37.8   MCV 78.0 - 100.0 fl 97.9   MCHC 30.0 - 36.0 g/dL 34.6   RDW 11.5 - 15.5 % 13.1    Platelet count summary:  Component Ref Range & Units 6 mo ago  (06/28/20) 1 yr ago  (06/21/19) 2 yr ago  (01/07/19) 2 yr ago  (01/01/19) 2 yr ago  (12/31/18) 2 yr ago  (12/30/18) 2 yr ago  (12/30/18)  WBC 4.0 - 10.5 K/uL 5.5  4.3  3.9Low  4.5  4.7  4.5  5.5   RBC 3.87 - 5.11 Mil/uL 3.86Low  3.99 R  2.34Low R  2.41Low R  2.29Low R  1.58Low R  2.02Low R   Platelets 150.0 - 400.0 K/uL 106.0Low  106Low R  153 R  102Low R, CM  99Low  R, CM  92Low R, CM  132Low R    Hemoglobin 12.0 - 15.0 g/dL 13.1  11.4Low  7.6Low  7.9Low  7.4Low CM  5.4Low  7.0Low         Objective:   Physical Exam Exam conducted with a chaperone present.  Constitutional:      Appearance: Normal appearance.  Cardiovascular:     Rate and Rhythm: Normal rate and regular rhythm.     Pulses: Normal pulses.     Heart sounds: Normal heart sounds.  Pulmonary:     Effort: Pulmonary effort is normal.     Breath sounds: Normal breath sounds.  Chest:  Breasts:     Right: No axillary adenopathy or supraclavicular adenopathy.     Left: No axillary adenopathy or supraclavicular adenopathy.        Comments: Bra size: 40 C. Musculoskeletal:     Cervical back: Neck supple.  Lymphadenopathy:     Upper Body:     Right upper body: No supraclavicular or axillary adenopathy.     Left  upper body: No supraclavicular or axillary adenopathy.  Skin:    General: Skin is warm and dry.  Neurological:     Mental Status: She is alert and oriented to person, place, and time.  Psychiatric:        Mood and Affect: Mood normal.        Behavior: Behavior normal.        Assessment:     2 foci of invasive mammary carcinoma involving the right central breast, retroareolar.     Plan:  The patient reports that she had 2 friends who recently underwent sentinel node procedure 1 who received Emla cream and 1 who did not.  She definitely wants to make use of the cream.  Proper application technique was reviewed.     Options for management were reviewed.  Breast conservation with postoperative radiation is equivalent to mastectomy with or without reconstruction regarding management of the cancer.  Pros and cons of each approach reviewed.  Possibility of loss of [place] of the nipple/areolar complex based on tumor location and intraoperative assessment was reviewed.  Based on today's ultrasound I do not anticipate this will be required should she choose breast conservation.  She has a low platelet count likely on the basis of splenomegaly secondary to cirrhosis.  Value should be adequate to minimize significant bleeding post surgery.  An informational brochure and website information was provided to the patient.  At this time she is strongly leaning towards breast conservation.  She was encouraged to call should she have questions regarding her options for management or if she desired assessment by plastic surgery.  We will plan for wide excision and sentinel node biopsy at a convenient date, presently scheduled for January 18, 2021.  Over 1 hour was spent on chart review, imaging studies and education regarding options for surgical management.     This note is partially prepared by Karie Fetch, RN, acting as a scribe in the presence of Dr. Hervey Ard, MD.  The  documentation recorded by the scribe accurately reflects the service I personally performed and the decisions made by me.   Robert Bellow, MD FACS  Electronically signed by Mayer Masker, MD on 01/09/2021 10:25 AM

## 2021-01-10 ENCOUNTER — Other Ambulatory Visit: Payer: Self-pay | Admitting: *Deleted

## 2021-01-10 DIAGNOSIS — Z23 Encounter for immunization: Secondary | ICD-10-CM

## 2021-01-10 MED ORDER — ALPRAZOLAM 0.5 MG PO TABS: ORAL_TABLET | ORAL | 0 refills | Status: DC

## 2021-01-10 NOTE — Telephone Encounter (Signed)
Refill provided, but she just filled this ~10 days ago.   I think a follow-up visit prior to additional refills would be helpful. She was recently diagnosed with cancer so understandably has greater needs, but we should come up with a better long term plan.

## 2021-01-10 NOTE — Telephone Encounter (Signed)
Last office visit 12/21/2020 with Dr. Damita Dunnings for allergy and GAD.  Last refilled 12/12/2020 for #10 with 2 refills.  No future appointments with PCP.

## 2021-01-11 ENCOUNTER — Other Ambulatory Visit
Admission: RE | Admit: 2021-01-11 | Discharge: 2021-01-11 | Disposition: A | Discharge status: Home / Self Care | Payer: BC Managed Care – PPO | Source: Ambulatory Visit | Attending: General Surgery | Admitting: General Surgery

## 2021-01-11 ENCOUNTER — Other Ambulatory Visit: Payer: Self-pay

## 2021-01-11 HISTORY — DX: Pneumonia, unspecified organism: J18.9

## 2021-01-11 NOTE — Patient Instructions (Signed)
Your procedure is scheduled on:01/18/21 - Friday Report to the Registration Desk on the 1st floor of the [facility]. To find out your arrival time, please call [phone] between 1PM - 3PM on: 01/17/21 - Thursday Report to [facility] on 01/16/21 at 10 am for Labs/EKG and Bag.  REMEMBER: Instructions that are not followed completely may result in serious medical risk, up to and including death; or upon the discretion of your surgeon and anesthesiologist your surgery may need to be rescheduled.  Do not eat food after midnight the night before surgery.  No gum chewing, lozengers or hard candies.  You may however, drink CLEAR liquids up to 2 hours before you are scheduled to arrive for your surgery. Do not drink anything within 2 hours of your scheduled arrival time.  Clear liquids include: - water  - apple juice without pulp - gatorade (not RED, PURPLE, OR BLUE) - black coffee or tea (Do NOT add milk or creamers to the coffee or tea) Do NOT drink anything that is not on this list.  TAKE THESE MEDICATIONS THE MORNING OF SURGERY WITH A SIP OF WATER:  - escitalopram (LEXAPRO) 20 MG tablet - famotidine (PEPCID) 20 MG tablet, take one the night before and one on the morning of surgery - helps to prevent nausea after surgery. - pregabalin (LYRICA) 300 MG capsule   Use albuterol (VENTOLIN HFA) 108 (90 Base) MCG/ACT inhaler on the day of surgery and bring to the hospital.  One week prior to surgery: Stop Anti-inflammatories (NSAIDS) such as Advil, Aleve, Ibuprofen, Motrin, Naproxen, Naprosyn and Aspirin based products such as Excedrin, Goodys Powder, BC Powder.  Stop ANY OVER THE COUNTER supplements until after surgery.  You may  take Tylenol if needed for pain up until the day of surgery.  No Alcohol for 24 hours before or after surgery.  No Smoking including e-cigarettes for 24 hours prior to surgery.  No chewable tobacco products for at least 6 hours prior to surgery.  No  nicotine patches on the day of surgery.  Do not use any "recreational" drugs for at least a week prior to your surgery.  Please be advised that the combination of cocaine and anesthesia may have negative outcomes, up to and including death. If you test positive for cocaine, your surgery will be cancelled.  On the morning of surgery brush your teeth with toothpaste and water, you may rinse your mouth with mouthwash if you wish. Do not swallow any toothpaste or mouthwash.  Do not wear jewelry, make-up, hairpins, clips or nail polish.  Do not wear lotions, powders, or perfumes.   Do not shave body from the neck down 48 hours prior to surgery just in case you cut yourself which could leave a site for infection.  Also, freshly shaved skin may become irritated if using the CHG soap.  Contact lenses, hearing aids and dentures may not be worn into surgery.  Do not bring valuables to the hospital. [facility] is not responsible for any missing/lost belongings or valuables.   Use CHG Soap or wipes as directed on instruction sheet.  Notify your doctor if there is any change in your medical condition (cold, fever, infection).  Wear comfortable clothing (specific to your surgery type) to the hospital.  Plan for stool softeners for home use; pain medications have a tendency to cause constipation. You can also help prevent constipation by eating foods high in fiber such as fruits and vegetables and drinking plenty of fluids as  your diet allows.  After surgery, you can help prevent lung complications by doing breathing exercises.  Take deep breaths and cough every 1-2 hours. Your doctor may order a device called an Incentive Spirometer to help you take deep breaths. When coughing or sneezing, hold a pillow firmly against your incision with both hands. This is called "splinting." Doing this helps protect your incision. It also decreases belly discomfort.  If you are being admitted to the hospital  overnight, leave your suitcase in the car. After surgery it may be brought to your room.  If you are being discharged the day of surgery, you will not be allowed to drive home. You will need a responsible adult (18 years or older) to drive you home and stay with you that night.   If you are taking public transportation, you will need to have a responsible adult (18 years or older) with you. Please confirm with your physician that it is acceptable to use public transportation.   Please call the [facility] at [phone] if you have any questions about these instructions.  Surgery Visitation Policy:  Patients undergoing a surgery or procedure may have one family member or support person with them as long as that person is not COVID-19 positive or experiencing its symptoms.  That person may remain in the waiting area during the procedure.  Inpatient Visitation:    Visiting hours are 7 a.m. to 8 p.m. Inpatients will be allowed two visitors daily. The visitors may change each day during the patient's stay. No visitors under the age of 49. Any visitor under the age of 38 must be accompanied by an adult. The visitor must pass COVID-19 screenings, use hand sanitizer when entering and exiting the patient's room and wear a mask at all times, including in the patient's room. Patients must also wear a mask when staff or their visitor are in the room. Masking is required regardless of vaccination status.

## 2021-01-15 ENCOUNTER — Telehealth: Payer: Self-pay | Admitting: *Deleted

## 2021-01-15 NOTE — Telephone Encounter (Signed)
Patient called asking if the result that was not back when she was seen is back yet. From what I can determine form chart a block was being sent for ER PR, HER 2 and I do not see results in chart at this time. That was on 5/17 path report. Please return call to patient regarding this matter

## 2021-01-15 NOTE — Telephone Encounter (Signed)
Thanks Hassan Rowan!  I will call her.  It is still not back.  Just got off phone with pathology.

## 2021-01-16 ENCOUNTER — Other Ambulatory Visit: Payer: Self-pay

## 2021-01-16 ENCOUNTER — Other Ambulatory Visit: Payer: Self-pay | Admitting: General Surgery

## 2021-01-16 ENCOUNTER — Other Ambulatory Visit
Admission: RE | Admit: 2021-01-16 | Discharge: 2021-01-16 | Disposition: A | Discharge status: Home / Self Care | Payer: BC Managed Care – PPO | Source: Ambulatory Visit | Attending: General Surgery | Admitting: General Surgery

## 2021-01-16 DIAGNOSIS — Z9049 Acquired absence of other specified parts of digestive tract: Secondary | ICD-10-CM | POA: Diagnosis not present

## 2021-01-16 DIAGNOSIS — I10 Essential (primary) hypertension: Secondary | ICD-10-CM | POA: Diagnosis not present

## 2021-01-16 DIAGNOSIS — Z87891 Personal history of nicotine dependence: Secondary | ICD-10-CM | POA: Diagnosis not present

## 2021-01-16 DIAGNOSIS — C773 Secondary and unspecified malignant neoplasm of axilla and upper limb lymph nodes: Secondary | ICD-10-CM | POA: Diagnosis not present

## 2021-01-16 DIAGNOSIS — Z01818 Encounter for other preprocedural examination: Secondary | ICD-10-CM | POA: Insufficient documentation

## 2021-01-16 DIAGNOSIS — Z8711 Personal history of peptic ulcer disease: Secondary | ICD-10-CM | POA: Diagnosis not present

## 2021-01-16 DIAGNOSIS — Z853 Personal history of malignant neoplasm of breast: Secondary | ICD-10-CM

## 2021-01-16 DIAGNOSIS — Z79899 Other long term (current) drug therapy: Secondary | ICD-10-CM | POA: Diagnosis not present

## 2021-01-16 DIAGNOSIS — C50911 Malignant neoplasm of unspecified site of right female breast: Secondary | ICD-10-CM | POA: Diagnosis not present

## 2021-01-16 DIAGNOSIS — G629 Polyneuropathy, unspecified: Secondary | ICD-10-CM | POA: Diagnosis not present

## 2021-01-16 DIAGNOSIS — Z8619 Personal history of other infectious and parasitic diseases: Secondary | ICD-10-CM | POA: Diagnosis not present

## 2021-01-16 LAB — COMPREHENSIVE METABOLIC PANEL
ALT: 30 U/L (ref 0–44)
AST: 33 U/L (ref 15–41)
Albumin: 4.3 g/dL (ref 3.5–5.0)
Alkaline Phosphatase: 64 U/L (ref 38–126)
Anion gap: 9 (ref 5–15)
BUN: 13 mg/dL (ref 6–20)
CO2: 23 mmol/L (ref 22–32)
Calcium: 9 mg/dL (ref 8.9–10.3)
Chloride: 105 mmol/L (ref 98–111)
Creatinine, Ser: 0.78 mg/dL (ref 0.44–1.00)
GFR, Estimated: >60 mL/min (ref >60)
Glucose, Bld: 127 mg/dL — ABNORMAL HIGH (ref 70–99)
Potassium: 3.7 mmol/L (ref 3.5–5.1)
Sodium: 137 mmol/L (ref 135–145)
Total Bilirubin: 0.9 mg/dL (ref 0.3–1.2)
Total Protein: 7.2 g/dL (ref 6.5–8.1)

## 2021-01-16 LAB — CBC WITH DIFFERENTIAL/PLATELET
Abs Immature Granulocytes: 0.03 10*3/uL (ref 0.00–0.07)
Basophils Absolute: 0 10*3/uL (ref 0.0–0.1)
Basophils Relative: 1 %
Eosinophils Absolute: 0.1 10*3/uL (ref 0.0–0.5)
Eosinophils Relative: 2 %
HCT: 35.9 % — ABNORMAL LOW (ref 36.0–46.0)
Hemoglobin: 12.6 g/dL (ref 12.0–15.0)
Immature Granulocytes: 1 %
Lymphocytes Relative: 34 %
Lymphs Abs: 1.6 10*3/uL (ref 0.7–4.0)
MCH: 33.2 pg (ref 26.0–34.0)
MCHC: 35.1 g/dL (ref 30.0–36.0)
MCV: 94.7 fL (ref 80.0–100.0)
Monocytes Absolute: 0.3 10*3/uL (ref 0.1–1.0)
Monocytes Relative: 5 %
Neutro Abs: 2.7 10*3/uL (ref 1.7–7.7)
Neutrophils Relative %: 57 %
Platelets: 107 10*3/uL — ABNORMAL LOW (ref 150–400)
RBC: 3.79 MIL/uL — ABNORMAL LOW (ref 3.87–5.11)
RDW: 12.9 % (ref 11.5–15.5)
WBC: 4.7 10*3/uL (ref 4.0–10.5)
nRBC: 0 % (ref 0.0–0.2)

## 2021-01-16 LAB — SURGICAL PATHOLOGY

## 2021-01-17 ENCOUNTER — Other Ambulatory Visit: Payer: Self-pay | Admitting: Radiology

## 2021-01-18 ENCOUNTER — Encounter
Admission: RE | Disposition: A | Discharge status: Home / Self Care | Payer: Self-pay | Source: Home / Self Care | Attending: General Surgery

## 2021-01-18 ENCOUNTER — Other Ambulatory Visit: Payer: Self-pay

## 2021-01-18 ENCOUNTER — Ambulatory Visit
Admission: RE | Admit: 2021-01-18 | Discharge: 2021-01-18 | Disposition: A | Discharge status: Home / Self Care | Payer: BC Managed Care – PPO | Source: Ambulatory Visit | Attending: General Surgery | Admitting: General Surgery

## 2021-01-18 ENCOUNTER — Encounter: Payer: Self-pay | Admitting: General Surgery

## 2021-01-18 ENCOUNTER — Ambulatory Visit: Payer: BC Managed Care – PPO | Admitting: Certified Registered"

## 2021-01-18 ENCOUNTER — Ambulatory Visit
Admission: RE | Admit: 2021-01-18 | Discharge: 2021-01-18 | Disposition: A | Discharge status: Home / Self Care | Payer: BC Managed Care – PPO | Attending: General Surgery | Admitting: General Surgery

## 2021-01-18 DIAGNOSIS — Z87891 Personal history of nicotine dependence: Secondary | ICD-10-CM | POA: Insufficient documentation

## 2021-01-18 DIAGNOSIS — Z79899 Other long term (current) drug therapy: Secondary | ICD-10-CM | POA: Insufficient documentation

## 2021-01-18 DIAGNOSIS — I10 Essential (primary) hypertension: Secondary | ICD-10-CM | POA: Insufficient documentation

## 2021-01-18 DIAGNOSIS — C773 Secondary and unspecified malignant neoplasm of axilla and upper limb lymph nodes: Secondary | ICD-10-CM | POA: Insufficient documentation

## 2021-01-18 DIAGNOSIS — C50911 Malignant neoplasm of unspecified site of right female breast: Secondary | ICD-10-CM | POA: Diagnosis not present

## 2021-01-18 DIAGNOSIS — Z8619 Personal history of other infectious and parasitic diseases: Secondary | ICD-10-CM | POA: Diagnosis not present

## 2021-01-18 DIAGNOSIS — C50211 Malignant neoplasm of upper-inner quadrant of right female breast: Secondary | ICD-10-CM | POA: Diagnosis not present

## 2021-01-18 DIAGNOSIS — Z8711 Personal history of peptic ulcer disease: Secondary | ICD-10-CM | POA: Diagnosis not present

## 2021-01-18 DIAGNOSIS — Z853 Personal history of malignant neoplasm of breast: Secondary | ICD-10-CM

## 2021-01-18 DIAGNOSIS — G629 Polyneuropathy, unspecified: Secondary | ICD-10-CM | POA: Diagnosis not present

## 2021-01-18 DIAGNOSIS — Z9049 Acquired absence of other specified parts of digestive tract: Secondary | ICD-10-CM | POA: Insufficient documentation

## 2021-01-18 DIAGNOSIS — C50411 Malignant neoplasm of upper-outer quadrant of right female breast: Secondary | ICD-10-CM | POA: Diagnosis not present

## 2021-01-18 DIAGNOSIS — C50811 Malignant neoplasm of overlapping sites of right female breast: Secondary | ICD-10-CM | POA: Diagnosis not present

## 2021-01-18 HISTORY — PX: BREAST LUMPECTOMY WITH SENTINEL LYMPH NODE BIOPSY: SHX5597

## 2021-01-18 SURGERY — BREAST LUMPECTOMY WITH SENTINEL LYMPH NODE BX
Anesthesia: General | Laterality: Right

## 2021-01-18 MED ORDER — ACETAMINOPHEN 10 MG/ML IV SOLN
INTRAVENOUS | Status: AC
  Filled 2021-01-18: qty 100

## 2021-01-18 MED ORDER — OXYCODONE HCL 5 MG/5ML PO SOLN: 5.0000 mg | Freq: Once | ORAL | Status: AC | PRN

## 2021-01-18 MED ORDER — FENTANYL CITRATE (PF) 100 MCG/2ML IJ SOLN: 25.0000 ug | INTRAMUSCULAR | Status: DC | PRN

## 2021-01-18 MED ORDER — PROPOFOL 10 MG/ML IV BOLUS
INTRAVENOUS | Status: DC | PRN
  Administered 2021-01-18: 170 mg via INTRAVENOUS

## 2021-01-18 MED ORDER — CHLORHEXIDINE GLUCONATE 0.12 % MT SOLN
15.0000 mL | Freq: Once | OROMUCOSAL | Status: AC
  Administered 2021-01-18: 15 mL via OROMUCOSAL

## 2021-01-18 MED ORDER — DEXMEDETOMIDINE (PRECEDEX) IN NS 20 MCG/5ML (4 MCG/ML) IV SYRINGE
PREFILLED_SYRINGE | INTRAVENOUS | Status: AC
  Filled 2021-01-18: qty 5

## 2021-01-18 MED ORDER — DEXMEDETOMIDINE (PRECEDEX) IN NS 20 MCG/5ML (4 MCG/ML) IV SYRINGE
PREFILLED_SYRINGE | INTRAVENOUS | Status: DC | PRN
  Administered 2021-01-18: 4 ug via INTRAVENOUS
  Administered 2021-01-18 (×2): 8 ug via INTRAVENOUS

## 2021-01-18 MED ORDER — MIDAZOLAM HCL 2 MG/2ML IJ SOLN
INTRAMUSCULAR | Status: AC
  Filled 2021-01-18: qty 2

## 2021-01-18 MED ORDER — CHLORHEXIDINE GLUCONATE CLOTH 2 % EX PADS
6.0000 | MEDICATED_PAD | Freq: Once | CUTANEOUS | Status: AC
  Administered 2021-01-18: 6 via TOPICAL

## 2021-01-18 MED ORDER — CEFAZOLIN SODIUM-DEXTROSE 2-4 GM/100ML-% IV SOLN
2.0000 g | INTRAVENOUS | Status: AC
  Administered 2021-01-18: 2 g via INTRAVENOUS

## 2021-01-18 MED ORDER — FENTANYL CITRATE (PF) 100 MCG/2ML IJ SOLN
INTRAMUSCULAR | Status: AC
  Filled 2021-01-18: qty 2

## 2021-01-18 MED ORDER — ONDANSETRON HCL 4 MG/2ML IJ SOLN
INTRAMUSCULAR | Status: AC
  Filled 2021-01-18: qty 2

## 2021-01-18 MED ORDER — GLYCOPYRROLATE 0.2 MG/ML IJ SOLN
INTRAMUSCULAR | Status: AC
  Filled 2021-01-18: qty 1

## 2021-01-18 MED ORDER — BUPIVACAINE-EPINEPHRINE (PF) 0.5% -1:200000 IJ SOLN
INTRAMUSCULAR | Status: AC
  Filled 2021-01-18: qty 30

## 2021-01-18 MED ORDER — ORAL CARE MOUTH RINSE: 15.0000 mL | Freq: Once | OROMUCOSAL | Status: AC

## 2021-01-18 MED ORDER — EPHEDRINE 5 MG/ML INJ
INTRAVENOUS | Status: AC
  Filled 2021-01-18: qty 10

## 2021-01-18 MED ORDER — ONDANSETRON HCL 4 MG/2ML IJ SOLN: 4.0000 mg | Freq: Once | INTRAMUSCULAR | Status: DC | PRN

## 2021-01-18 MED ORDER — KETOROLAC TROMETHAMINE 30 MG/ML IJ SOLN
INTRAMUSCULAR | Status: AC
  Filled 2021-01-18: qty 1

## 2021-01-18 MED ORDER — DEXAMETHASONE SODIUM PHOSPHATE 10 MG/ML IJ SOLN
INTRAMUSCULAR | Status: DC | PRN
  Administered 2021-01-18: 10 mg via INTRAVENOUS

## 2021-01-18 MED ORDER — OXYCODONE HCL 5 MG PO TABS
5.0000 mg | ORAL_TABLET | Freq: Once | ORAL | Status: AC | PRN
  Administered 2021-01-18: 5 mg via ORAL

## 2021-01-18 MED ORDER — EPHEDRINE SULFATE 50 MG/ML IJ SOLN
INTRAMUSCULAR | Status: DC | PRN
  Administered 2021-01-18: 10 mg via INTRAVENOUS

## 2021-01-18 MED ORDER — TECHNETIUM TC 99M TILMANOCEPT KIT
1.1000 | PACK | Freq: Once | INTRAVENOUS | Status: AC | PRN
  Administered 2021-01-18: 1.1 via INTRADERMAL

## 2021-01-18 MED ORDER — GLYCOPYRROLATE 0.2 MG/ML IJ SOLN
INTRAMUSCULAR | Status: DC | PRN
  Administered 2021-01-18: .2 mg via INTRAVENOUS

## 2021-01-18 MED ORDER — ACETAMINOPHEN 10 MG/ML IV SOLN: 1000.0000 mg | Freq: Once | INTRAVENOUS | Status: DC | PRN

## 2021-01-18 MED ORDER — ACETAMINOPHEN 10 MG/ML IV SOLN
INTRAVENOUS | Status: DC | PRN
  Administered 2021-01-18: 1000 mg via INTRAVENOUS

## 2021-01-18 MED ORDER — CHLORHEXIDINE GLUCONATE CLOTH 2 % EX PADS: 6.0000 | MEDICATED_PAD | Freq: Once | CUTANEOUS | Status: DC

## 2021-01-18 MED ORDER — CEFAZOLIN SODIUM-DEXTROSE 2-4 GM/100ML-% IV SOLN
INTRAVENOUS | Status: AC
  Filled 2021-01-18: qty 100

## 2021-01-18 MED ORDER — LIDOCAINE HCL (CARDIAC) PF 100 MG/5ML IV SOSY
PREFILLED_SYRINGE | INTRAVENOUS | Status: DC | PRN
  Administered 2021-01-18: 80 mg via INTRAVENOUS

## 2021-01-18 MED ORDER — DEXAMETHASONE SODIUM PHOSPHATE 10 MG/ML IJ SOLN
INTRAMUSCULAR | Status: AC
  Filled 2021-01-18: qty 1

## 2021-01-18 MED ORDER — LACTATED RINGERS IV SOLN: INTRAVENOUS | Status: DC

## 2021-01-18 MED ORDER — CHLORHEXIDINE GLUCONATE 0.12 % MT SOLN
OROMUCOSAL | Status: AC
  Filled 2021-01-18: qty 15

## 2021-01-18 MED ORDER — KETOROLAC TROMETHAMINE 30 MG/ML IJ SOLN
INTRAMUSCULAR | Status: DC | PRN
  Administered 2021-01-18: 30 mg via INTRAVENOUS

## 2021-01-18 MED ORDER — ONDANSETRON HCL 4 MG/2ML IJ SOLN
INTRAMUSCULAR | Status: DC | PRN
  Administered 2021-01-18: 4 mg via INTRAVENOUS

## 2021-01-18 MED ORDER — HYDROCODONE-ACETAMINOPHEN 5-325 MG PO TABS: 1.0000 | ORAL_TABLET | ORAL | 0 refills | Status: DC | PRN

## 2021-01-18 MED ORDER — BUPIVACAINE-EPINEPHRINE (PF) 0.5% -1:200000 IJ SOLN
INTRAMUSCULAR | Status: DC | PRN
  Administered 2021-01-18: 20 mL via PERINEURAL
  Administered 2021-01-18: 10 mL via PERINEURAL

## 2021-01-18 MED ORDER — FENTANYL CITRATE (PF) 100 MCG/2ML IJ SOLN
INTRAMUSCULAR | Status: DC | PRN
  Administered 2021-01-18: 25 ug via INTRAVENOUS
  Administered 2021-01-18: 50 ug via INTRAVENOUS
  Administered 2021-01-18 (×2): 25 ug via INTRAVENOUS
  Administered 2021-01-18: 50 ug via INTRAVENOUS
  Administered 2021-01-18: 25 ug via INTRAVENOUS

## 2021-01-18 MED ORDER — PROPOFOL 10 MG/ML IV BOLUS
INTRAVENOUS | Status: AC
  Filled 2021-01-18: qty 20

## 2021-01-18 SURGICAL SUPPLY — 45 items
APL PRP STRL LF DISP 70% ISPRP (MISCELLANEOUS) ×1
BLADE SURG 15 STRL SS SAFETY (BLADE) ×4 IMPLANT
CANISTER SUCT 1200ML W/VALVE (MISCELLANEOUS) ×2 IMPLANT
CHLORAPREP W/TINT 26 (MISCELLANEOUS) ×2 IMPLANT
COVER PROBE FLX POLY STRL (MISCELLANEOUS) ×2 IMPLANT
COVER WAND RF STERILE (DRAPES) ×2 IMPLANT
DEVICE DUBIN SPECIMEN MAMMOGRA (MISCELLANEOUS) ×2 IMPLANT
DRAPE LAPAROTOMY TRNSV 106X77 (MISCELLANEOUS) ×2 IMPLANT
DRSG GAUZE FLUFF 36X18 (GAUZE/BANDAGES/DRESSINGS) ×4 IMPLANT
DRSG TELFA 3X8 NADH (GAUZE/BANDAGES/DRESSINGS) ×2 IMPLANT
ELECT CAUTERY BLADE TIP 2.5 (TIP) ×2
ELECT REM PT RETURN 9FT ADLT (ELECTROSURGICAL) ×2
ELECTRODE CAUTERY BLDE TIP 2.5 (TIP) ×1 IMPLANT
ELECTRODE REM PT RTRN 9FT ADLT (ELECTROSURGICAL) ×1 IMPLANT
GLOVE SURG ENC MOIS LTX SZ7.5 (GLOVE) ×2 IMPLANT
GLOVE SURG UNDER LTX SZ8 (GLOVE) ×2 IMPLANT
GOWN STRL REUS W/ TWL LRG LVL3 (GOWN DISPOSABLE) ×2 IMPLANT
GOWN STRL REUS W/TWL LRG LVL3 (GOWN DISPOSABLE) ×4
KIT TURNOVER KIT A (KITS) ×2 IMPLANT
LABEL OR SOLS (LABEL) ×2 IMPLANT
MANIFOLD NEPTUNE II (INSTRUMENTS) ×2 IMPLANT
MARGIN MAP 10MM (MISCELLANEOUS) ×2 IMPLANT
NDL HYPO 25X1 1.5 SAFETY (NEEDLE) ×2 IMPLANT
NEEDLE HYPO 22GX1.5 SAFETY (NEEDLE) ×2 IMPLANT
NEEDLE HYPO 25X1 1.5 SAFETY (NEEDLE) ×4 IMPLANT
PACK BASIN MINOR ARMC (MISCELLANEOUS) ×2 IMPLANT
RETRACTOR RING XSMALL (MISCELLANEOUS) IMPLANT
RTRCTR WOUND ALEXIS 13CM XS SH (MISCELLANEOUS) ×2
SLEVE PROBE SENORX GAMMA FIND (MISCELLANEOUS) ×1 IMPLANT
STRIP CLOSURE SKIN 1/2X4 (GAUZE/BANDAGES/DRESSINGS) ×2 IMPLANT
SUT ETHILON 3-0 FS-10 30 BLK (SUTURE) ×2
SUT SILK 2 0 (SUTURE) ×2
SUT SILK 2-0 18XBRD TIE 12 (SUTURE) ×1 IMPLANT
SUT VIC AB 2-0 CT1 27 (SUTURE) ×4
SUT VIC AB 2-0 CT1 TAPERPNT 27 (SUTURE) ×2 IMPLANT
SUT VIC AB 3-0 SH 27 (SUTURE) ×4
SUT VIC AB 3-0 SH 27X BRD (SUTURE) ×2 IMPLANT
SUT VIC AB 4-0 FS2 27 (SUTURE) ×4 IMPLANT
SUT VICRYL+ 3-0 144IN (SUTURE) ×2 IMPLANT
SUTURE EHLN 3-0 FS-10 30 BLK (SUTURE) ×1 IMPLANT
SWABSTK COMLB BENZOIN TINCTURE (MISCELLANEOUS) ×2 IMPLANT
SYR 10ML LL (SYRINGE) ×2 IMPLANT
SYR BULB IRRIG 60ML STRL (SYRINGE) ×2 IMPLANT
TAPE TRANSPORE STRL 2 31045 (GAUZE/BANDAGES/DRESSINGS) ×2 IMPLANT
WATER STERILE IRR 1000ML POUR (IV SOLUTION) ×2 IMPLANT

## 2021-01-18 NOTE — Discharge Instructions (Signed)

## 2021-01-18 NOTE — Telephone Encounter (Signed)
Mychart message sent to pt requesting that she schedule a follow-up appt if needing rx frequently.

## 2021-01-18 NOTE — Anesthesia Preprocedure Evaluation (Signed)
Anesthesia Evaluation  Patient identified by MRN, date of birth, ID band Patient awake    Reviewed: Allergy & Precautions, NPO status , Patient's Chart, lab work & pertinent test results  History of Anesthesia Complications Negative for: history of anesthetic complications  Airway Mallampati: II  TM Distance: >3 FB Neck ROM: Full    Dental  (+) Edentulous Upper, Partial Lower   Pulmonary asthma , neg sleep apnea, neg COPD, Patient abstained from smoking.Not current smoker, former smoker,    Pulmonary exam normal breath sounds clear to auscultation       Cardiovascular Exercise Tolerance: Good METShypertension, (-) CAD and (-) Past MI (-) dysrhythmias  Rhythm:Regular Rate:Normal - Systolic murmurs    Neuro/Psych PSYCHIATRIC DISORDERS Anxiety Depression  Neuromuscular disease    GI/Hepatic PUD, neg GERD  ,(+)     (-) substance abuse  ,   Endo/Other  neg diabetes  Renal/GU negative Renal ROS     Musculoskeletal   Abdominal   Peds  Hematology  (+) anemia ,   Anesthesia Other Findings Past Medical History: No date: Allergy No date: Anemia No date: Asthma No date: Blood transfusion without reported diagnosis No date: Chicken pox No date: Colon polyps No date: Depression No date: Heart murmur No date: Hypertension No date: Neuromuscular disorder (HCC)     Comment:  neuropathy No date: Pneumonia No date: Stomach ulcer  Reproductive/Obstetrics                             Anesthesia Physical Anesthesia Plan  ASA: II  Anesthesia Plan: General   Post-op Pain Management:    Induction: Intravenous  PONV Risk Score and Plan: 3 and Ondansetron, Dexamethasone and Midazolam  Airway Management Planned: LMA  Additional Equipment: None  Intra-op Plan:   Post-operative Plan: Extubation in OR  Informed Consent: I have reviewed the patients History and Physical, chart, labs and  discussed the procedure including the risks, benefits and alternatives for the proposed anesthesia with the patient or authorized representative who has indicated his/her understanding and acceptance.     Dental advisory given  Plan Discussed with: CRNA and Surgeon  Anesthesia Plan Comments: (Discussed risks of anesthesia with patient, including PONV, sore throat, lip/dental damage. Rare risks discussed as well, such as cardiorespiratory and neurological sequelae. Patient understands.)        Anesthesia Quick Evaluation

## 2021-01-18 NOTE — H&P (Signed)
Morgan Sanders [id number] 07-May-1976     HPI:  45 y/o woman with recently diagnosed right breast cancer.   Medications Prior to Admission  Medication Sig Dispense Refill Last Dose  . albuterol (VENTOLIN HFA) 108 (90 Base) MCG/ACT inhaler INHALE 2 PUFFS INTO THE LUNGS EVERY 4 (FOUR) HOURS AS NEEDED FOR SHORTNESS OF BREATH OR WHEEZING 8.5 each 2 01/18/2021 at 0700  . Biotin w/ Vitamins C & E (HAIR/SKIN/NAILS PO) Take 1 tablet by mouth daily.   01/11/2021  . Cholecalciferol (VITAMIN D3) 125 MCG (5000 UT) CAPS Take 5,000 Units by mouth daily.   01/11/2021  . escitalopram (LEXAPRO) 20 MG tablet TAKE 1 TABLET BY MOUTH EVERY DAY (Patient taking differently: Take 20 mg by mouth daily.) 90 tablet 3 01/18/2021 at 0630  . famotidine (PEPCID) 20 MG tablet Take 20 mg by mouth 2 (two) times daily.   01/18/2021 at 0630  . loratadine (CLARITIN) 10 MG tablet Take 10 mg by mouth daily.   01/11/2021  . MAG-G 500 (27 Mg) MG TABS TAKE 1 TABLET (500MG ) BY MOUTH EVERY DAY (Patient taking differently: Take 500 mg by mouth daily.) 30 tablet 3 01/11/2021  . Potassium Chloride ER 20 MEQ TBCR TAKE 1 TABLET BY MOUTH EVERY DAY (Patient taking differently: Take 20 mEq by mouth daily.) 90 tablet 1 01/11/2021  . pregabalin (LYRICA) 300 MG capsule Take 300 mg by mouth 2 (two) times daily.   01/18/2021 at 0630  . pyridOXINE (VITAMIN B-6) 100 MG tablet Take 100 mg by mouth daily.   01/11/2021  . thiamine (VITAMIN B-1) 100 MG tablet Take 250 mg by mouth daily.    01/11/2021  . vitamin B-12 (CYANOCOBALAMIN) 500 MCG tablet Take 2,500 mcg by mouth daily.   01/11/2021  . ALPRAZolam (XANAX) 0.5 MG tablet TAKE 1 TABLET BY MOUTH AS NEEDED FOR ANXIETY. 10 tablet 0 01/16/2021   No Known Allergies Past Medical History:  Diagnosis Date  . Allergy   . Anemia   . Asthma   . Blood transfusion without reported diagnosis   . Chicken pox   . Colon polyps   . Depression   . Heart murmur   . Hypertension   . Neuromuscular disorder (HCC)    neuropathy   . Pneumonia   . Stomach ulcer    Past Surgical History:  Procedure Laterality Date  . BREAST BIOPSY Right 01/01/2021   u/s bx 12:00 1 cmfn path pending x clip lateral  . BREAST BIOPSY Right 01/01/2021   u/s bx 12:00 1cmfn path pending vision medial  . CHOLECYSTECTOMY    . CHOLECYSTECTOMY, LAPAROSCOPIC  03/1996  . COLONOSCOPY    . ESOPHAGOGASTRODUODENOSCOPY (EGD) WITH PROPOFOL N/A 12/31/2018   Procedure: ESOPHAGOGASTRODUODENOSCOPY (EGD) WITH PROPOFOL;  Surgeon: Lucilla Lame, MD;  Location: [facility] ENDOSCOPY;  Service: Endoscopy;  Laterality: N/A;  . TUBAL LIGATION    . WISDOM TOOTH EXTRACTION     Social History   Socioeconomic History  . Marital status: Married    Spouse name: Morgan Sanders  . Number of children: 2  . Years of education: high school  . Highest education level: 12th grade  Occupational History  . Occupation: Golf/clubhouse attendent  Tobacco Use  . Smoking status: Former Smoker    Packs/day: 1.00    Years: 20.00    Pack years: 20.00    Types: Cigarettes  . Smokeless tobacco: Never Used  Vaping Use  . Vaping Use: Some days  . Substances: Nicotine, Flavoring  Substance and Sexual Activity  .  Alcohol use: Yes    Comment: a few times a year  . Drug use: Never  . Sexual activity: Not Currently  Other Topics Concern  . Not on file  Social History Narrative   02/07/20   From: the area   Living: with husband, Morgan Sanders (2000)   Work: at a golf course      Family: daughters - CJ (lives at [employer], 2 children) and Art gallery manager (still living at home, in [employer] school)      Enjoys: golf       Exercise: golfing 3-4 times a week - alternating between cart/walking   Diet: pretty good - eggs/fruit, eats lunch and dinner      Safety   Seat belts: Yes    Guns: Yes  and secure   Safe in relationships: Yes    Social Determinants of Radio broadcast assistant Strain: Not on file  Food Insecurity: Not on file  Transportation Needs: Not on file  Physical Activity: Not on file   Stress: Not on file  Social Connections: Not on file  Intimate Partner Violence: Not on file   Social History   Social History Narrative   02/07/20   From: [place] area   Living: with husband, Morgan Sanders (2000)   Work: at a golf course      Family: daughters - CJ (lives at [employer], 2 children) and Art gallery manager (still living at home, in Sports coach school)      Enjoys: golf       Exercise: golfing 3-4 times a week - alternating between cart/walking   Diet: pretty good - eggs/fruit, eats lunch and dinner      Safety   Seat belts: Yes    Guns: Yes  and secure   Safe in relationships: Yes      ROS: Negative.     PE: HEENT: Negative. Lungs: Clear. Cardio: RR.   Assessment/Plan:  Proceed with planned right breast wide excision, SLN biopsy.   Forest Gleason [facility] 01/18/2021

## 2021-01-18 NOTE — OR Nursing (Signed)
Per Dr. Bary Castilla verbal, he does not need to see pt in postop prior to discharge.

## 2021-01-18 NOTE — Anesthesia Procedure Notes (Signed)
Procedure Name: LMA Insertion Date/Time: 01/18/2021 9:29 AM Performed by: Jerrye Noble, CRNA Pre-anesthesia Checklist: Patient identified, Emergency Drugs available, Suction available and Patient being monitored Patient Re-evaluated:Patient Re-evaluated prior to induction Oxygen Delivery Method: Circle system utilized Preoxygenation: Pre-oxygenation with 100% oxygen Induction Type: IV induction Ventilation: Mask ventilation without difficulty LMA: LMA inserted LMA Size: 3.5 Number of attempts: 1 Placement Confirmation: positive ETCO2 and breath sounds checked- equal and bilateral Tube secured with: Tape Dental Injury: Teeth and Oropharynx as per pre-operative assessment

## 2021-01-18 NOTE — Op Note (Signed)
Preoperative diagnosis: Multifocal invasive mammary carcinoma of the right breast.  Postoperative diagnosis: Same.  Operative procedure: Ultrasound-guided excision of the right retroareolar breast cancer, tissue transfer, sentinel node biopsy.  Operating surgeon: Hervey Ard, MD.  Anesthesia: General by LMA, Marcaine 0.5% with 1: 200,000 units of epinephrine.  Estimated blood loss: 5 cc.  Clinical note: This 45 year old woman had an abnormal mammogram and subsequent biopsy showed 2 areas of invasive mammary carcinoma.  1 was superficial in the retroareolar tissue and the second just outside the edge of the areola at the 12-1 o'clock position.  She desired breast conservation.  She underwent injection with technetium sulfur colloid the morning of surgery.  She received Ancef on induction of anesthesia.  SCD stockings for DVT prevention.  Operative note: The patient tolerated general anesthesia well.  The breast, chest and axilla was then cleansed with ChloraPrep and draped.  Methylene blue was not used due to the subcu areolar location of the tumor mass.  Ultrasound was used to confirm location of the 2 biopsy sites and these were marked.  Images were printed for permanent record.  20 cc of the above-noted local anesthetic was used around the breast for postoperative analgesia.  This was kept away from the nipple areolar complex to minimize the chance for ischemia.  With the very superficial location it was elected to make use of a circumareolar incision from the 9 to 3 o'clock position.  The skin was incised sharply and the remaining dissection completed with electrocautery.  The areolar skin was elevated off the underlying tissue.  The ductal tissue just below the nipple was tagged with the "skin" marker.  A block of tissue 4 x 5 x 3 cm was resected.  This did not extend to the deep fascia.  Specimen radiograph confirmed both previously placed clips in the specimen.  While the breast specimen  was being processed attention was turned to the axilla.  The node seeker device was used and local anesthesia infiltrated.  A transverse incision was made in the skin divided sharply.  The remaining dissection with electrocautery.  Once the level of the axillary envelope was opened a extra small Alexis wound protector was placed.  This allowed excellent exposure.  For hot lymph nodes were identified with counts ranging between 509,000.  These were all sent together as sentinel nodes 1-4.  These were placed in formalin and sent for routine histology.  The axillary envelope was closed with 2-0 Vicryl figure-of-eight sutures.  The adipose layer was closed in similar fashion.  The skin was closed with a running 4-0 Vicryl subcuticular suture.  Telephone report from the pathology showed the margins appear to be grossly negative.  The breast parenchyma was elevated superiorly to help buttress the retroareolar space where the tissue had been removed.  This encompassed greater than 15 cm area.  The deep tissue was approximated in layers with interrupted 2-0 Vicryl figure-of-eight sutures.  This was done in 3 layers and then the skin closed with a running 4-0 Vicryl subcuticular suture.  A Telfa dressing was cut to minimize compression on the nipple and then fluff gauze followed by a chest binder was placed.  The patient tolerated the procedure well and was taken to the recovery room in stable condition.

## 2021-01-18 NOTE — Anesthesia Postprocedure Evaluation (Signed)
Anesthesia Post Note  Patient: Morgan Sanders  Procedure(s) Performed: BREAST LUMPECTOMY WITH SENTINEL LYMPH NODE BX (Right )  Patient location during evaluation: PACU Anesthesia Type: General Level of consciousness: awake and alert Pain management: pain level controlled Vital Signs Assessment: post-procedure vital signs reviewed and stable Respiratory status: spontaneous breathing, nonlabored ventilation, respiratory function stable and patient connected to nasal cannula oxygen Cardiovascular status: blood pressure returned to baseline and stable Postop Assessment: no apparent nausea or vomiting Anesthetic complications: no   No complications documented.   Last Vitals:  Vitals:   01/18/21 1145 01/18/21 1200  BP: 119/89 (!) 146/84  Pulse: 75 72  Resp: 20 13  Temp:  36.7 C  SpO2: 98% 95%    Last Pain:  Vitals:   01/18/21 1213  TempSrc:   PainSc: 3                  Arita Miss

## 2021-01-18 NOTE — Transfer of Care (Signed)
Immediate Anesthesia Transfer of Care Note  Patient: Morgan Sanders  Procedure(s) Performed: BREAST LUMPECTOMY WITH SENTINEL LYMPH NODE BX (Right )  Patient Location: PACU  Anesthesia Type:General  Level of Consciousness: drowsy  Airway & Oxygen Therapy: Patient Spontanous Breathing and Patient connected to face mask oxygen  Post-op Assessment: Report given to RN and Post -op Vital signs reviewed and stable  Post vital signs: Reviewed and stable  Last Vitals:  Vitals Value Taken Time  BP 151/80 01/18/21 1116  Temp    Pulse 73 01/18/21 1118  Resp 14 01/18/21 1118  SpO2 98 % 01/18/21 1118  Vitals shown include unvalidated device data.  Last Pain:  Vitals:   01/18/21 0822  TempSrc: Oral  PainSc: 0-No pain         Complications: No complications documented.

## 2021-01-21 ENCOUNTER — Telehealth: Payer: Self-pay | Admitting: Oncology

## 2021-01-21 NOTE — Telephone Encounter (Signed)
Patient scheduled for genetics appt on 6/9--she called to cancel stating that she "doesn't want to have that done". When asked if she would like to r/s she declined.

## 2021-01-22 ENCOUNTER — Other Ambulatory Visit: Payer: Self-pay | Admitting: General Surgery

## 2021-01-24 ENCOUNTER — Inpatient Hospital Stay: Payer: BC Managed Care – PPO | Admitting: Licensed Clinical Social Worker

## 2021-01-24 ENCOUNTER — Inpatient Hospital Stay: Payer: BC Managed Care – PPO

## 2021-01-29 ENCOUNTER — Encounter: Payer: Self-pay | Admitting: Oncology

## 2021-01-29 ENCOUNTER — Inpatient Hospital Stay: Payer: BC Managed Care – PPO | Attending: Oncology | Admitting: Oncology

## 2021-01-29 ENCOUNTER — Other Ambulatory Visit: Payer: Self-pay

## 2021-01-29 VITALS — BP 131/90 | HR 70 | Temp 97.7°F | Ht 67.0 in | Wt 194.9 lb

## 2021-01-29 DIAGNOSIS — C50911 Malignant neoplasm of unspecified site of right female breast: Secondary | ICD-10-CM | POA: Diagnosis not present

## 2021-01-29 DIAGNOSIS — Z170 Estrogen receptor positive status [ER+]: Secondary | ICD-10-CM | POA: Diagnosis not present

## 2021-01-29 DIAGNOSIS — Z7189 Other specified counseling: Secondary | ICD-10-CM | POA: Diagnosis not present

## 2021-01-29 NOTE — Progress Notes (Signed)
Hematology/Oncology Consult note [facility]  [phone] [phone]  Patient Care Team: Lesleigh Noe, MD as PCP - General (Family Medicine) Reeves Forth (Gastroenterology) Sharlet Salina, MD as Referring Physician (Physical Medicine and Rehabilitation) Maximiano Coss, Loraine Grip, MD (Neurology) Theodore Demark, RN as Oncology Nurse Navigator   Name of the patient: Morgan Sanders  [id number]  Jun 27, 1976   Date of visit: 01/29/21  Diagnosis-pathological prognostic stage Ia invasive mammary carcinoma of the right breast and pT1b pN1 acM0 ER/PR positive HER2 negative  Chief complaint/ Reason for visit-discuss final pathology results and further management  Heme/Onc history: Patient is a 45 year old female with a prior history of normocytic anemia for which she had a complete work-up including bone marrow biopsy as well as history of cirrhosis of the liver.  She is postmenopausal and she has not had any menstrual cycles since the age of 72.  Patient underwent a bilateral screening mammogram on 12/21/2020 which showed possible mass in the right breast.  This was followed by diagnostic mammogram and ultrasound which showed 2 masses in the right breast at 12 o'clock position each measuring 6 mm and 1.2 cm apart.  Ultrasound of the right axilla was negative for malignancy.  Patient underwent biopsy of both the medial and the lateral breast mass and it was consistent with invasive mammary carcinoma grade 2.  ER greater than 90% positive, PR greater than 90% positive and HER2 negative  Patient underwent right lumpectomy with sentinel lymph node biopsy Final pathology showed 2 tumors 7 mm in size both of which were grade 1.  The tissue in between did not have any evidence of malignancy or DCIS.  She was noted to have metastatic carcinoma in 2 out of 4 sentinel lymph node 6 mm deposit with no extracapsular extension.  Interval history-patient is doing  well postlumpectomy and denies any specific complaints today.  ECOG PS- 1 Pain scale- 0 Opioid associated constipation- no  Review of systems- Review of Systems  Constitutional:  Negative for chills, fever, malaise/fatigue and weight loss.  HENT:  Negative for congestion, ear discharge and nosebleeds.   Eyes:  Negative for blurred vision.  Respiratory:  Negative for cough, hemoptysis, sputum production, shortness of breath and wheezing.   Cardiovascular:  Negative for chest pain, palpitations, orthopnea and claudication.  Gastrointestinal:  Negative for abdominal pain, blood in stool, constipation, diarrhea, heartburn, melena, nausea and vomiting.  Genitourinary:  Negative for dysuria, flank pain, frequency, hematuria and urgency.  Musculoskeletal:  Negative for back pain, joint pain and myalgias.  Skin:  Negative for rash.  Neurological:  Negative for dizziness, tingling, focal weakness, seizures, weakness and headaches.  Endo/Heme/Allergies:  Does not bruise/bleed easily.  Psychiatric/Behavioral:  Negative for depression and suicidal ideas. The patient does not have insomnia.       No Known Allergies   Past Medical History:  Diagnosis Date   Allergy    Anemia    Asthma    Blood transfusion without reported diagnosis    Chicken pox    Colon polyps    Depression    Heart murmur    Hypertension    Neuromuscular disorder ([place])    neuropathy   Pneumonia    Stomach ulcer      Past Surgical History:  Procedure Laterality Date   BREAST BIOPSY Right 01/01/2021   u/s bx 12:00 1 cmfn path pending x clip lateral   BREAST BIOPSY Right 01/01/2021   u/s bx  12:00 1cmfn path pending vision medial   BREAST LUMPECTOMY WITH SENTINEL LYMPH NODE BIOPSY Right 01/18/2021   Procedure: BREAST LUMPECTOMY WITH SENTINEL LYMPH NODE BX;  Surgeon: Robert Bellow, MD;  Location: [facility] ORS;  Service: General;  Laterality: Right;   CHOLECYSTECTOMY     CHOLECYSTECTOMY, LAPAROSCOPIC  03/1996    COLONOSCOPY     ESOPHAGOGASTRODUODENOSCOPY (EGD) WITH PROPOFOL N/A 12/31/2018   Procedure: ESOPHAGOGASTRODUODENOSCOPY (EGD) WITH PROPOFOL;  Surgeon: Lucilla Lame, MD;  Location: [facility] ENDOSCOPY;  Service: Endoscopy;  Laterality: N/A;   TUBAL LIGATION     WISDOM TOOTH EXTRACTION      Social History   Socioeconomic History   Marital status: Married    Spouse name: [name]   Number of children: 2   Years of education: high school   Highest education level: 12th grade  Occupational History   Occupation: Golf/clubhouse attendent  Tobacco Use   Smoking status: Former    Packs/day: 1.00    Years: 20.00    Pack years: 20.00    Types: Cigarettes   Smokeless tobacco: Never  Vaping Use   Vaping Use: Some days   Substances: Nicotine, Flavoring  Substance and Sexual Activity   Alcohol use: Yes    Comment: a few times a year   Drug use: Never   Sexual activity: Not Currently  Other Topics Concern   Not on file  Social History Narrative   02/07/20   From: the area   Living: with husband, [name] (2000)   Work: at a golf course      Family: daughters - CJ (lives at [employer], 2 children) and Art gallery manager (still living at home, in Sports coach school)      Enjoys: golf       Exercise: golfing 3-4 times a week - alternating between cart/walking   Diet: pretty good - eggs/fruit, eats lunch and dinner      Safety   Seat belts: Yes    Guns: Yes  and secure   Safe in relationships: Yes    Social Determinants of Radio broadcast assistant Strain: Not on file  Food Insecurity: Not on file  Transportation Needs: Not on file  Physical Activity: Not on file  Stress: Not on file  Social Connections: Not on file  Intimate Partner Violence: Not on file    Family History  Problem Relation Age of Onset   Breast cancer Maternal Grandmother 78   Arthritis Maternal Grandmother    Cancer Maternal Grandmother    Diabetes Maternal Grandmother    Hearing loss Maternal Grandmother    Hypertension Maternal  Grandmother    Bone cancer Maternal Great-grandmother    Hypertension Mother    Diabetes Mother    Hyperlipidemia Mother    Kidney disease Mother    Arthritis Mother    Asthma Mother    Depression Mother    Sudden death Father        hit by train   Liver disease Father        hx of liver transplant   Early death Father    Cataracts Paternal Grandfather    Lung cancer Paternal Grandfather    Hyperlipidemia Sister    Hypertension Sister    Depression Daughter    Early death Maternal Grandfather    Depression Daughter      Current Outpatient Medications:    albuterol (VENTOLIN HFA) 108 (90 Base) MCG/ACT inhaler, INHALE 2 PUFFS INTO THE LUNGS EVERY 4 (FOUR) HOURS AS NEEDED FOR SHORTNESS  OF BREATH OR WHEEZING, Disp: 8.5 each, Rfl: 2   ALPRAZolam (XANAX) 0.5 MG tablet, TAKE 1 TABLET BY MOUTH AS NEEDED FOR ANXIETY., Disp: 10 tablet, Rfl: 0   Biotin w/ Vitamins C & E (HAIR/SKIN/NAILS PO), Take 1 tablet by mouth daily., Disp: , Rfl:    Cholecalciferol (VITAMIN D3) 125 MCG (5000 UT) CAPS, Take 5,000 Units by mouth daily., Disp: , Rfl:    escitalopram (LEXAPRO) 20 MG tablet, TAKE 1 TABLET BY MOUTH EVERY DAY (Patient taking differently: Take 20 mg by mouth daily.), Disp: 90 tablet, Rfl: 3   famotidine (PEPCID) 20 MG tablet, Take 20 mg by mouth 2 (two) times daily., Disp: , Rfl:    loratadine (CLARITIN) 10 MG tablet, Take 10 mg by mouth daily., Disp: , Rfl:    Potassium Chloride ER 20 MEQ TBCR, TAKE 1 TABLET BY MOUTH EVERY DAY (Patient taking differently: Take 20 mEq by mouth daily.), Disp: 90 tablet, Rfl: 1   pregabalin (LYRICA) 300 MG capsule, Take 300 mg by mouth 2 (two) times daily., Disp: , Rfl:    pyridOXINE (VITAMIN B-6) 100 MG tablet, Take 100 mg by mouth daily., Disp: , Rfl:    thiamine (VITAMIN B-1) 100 MG tablet, Take 250 mg by mouth daily. , Disp: , Rfl:    vitamin B-12 (CYANOCOBALAMIN) 500 MCG tablet, Take 2,500 mcg by mouth daily., Disp: , Rfl:    MAG-G 500 (27 Mg) MG TABS, TAKE  1 TABLET (500MG) BY MOUTH EVERY DAY (Patient taking differently: Take 500 mg by mouth daily.), Disp: 30 tablet, Rfl: 3  Physical exam:  Vitals:   01/29/21 1027  BP: 131/90  Pulse: 70  Temp: 97.7 F (36.5 C)  SpO2: 100%  Weight: 194 lb 14.4 oz (88.4 kg)  Height: 5' 7"  (1.702 m)   Physical Exam Constitutional:      General: She is not in acute distress. Cardiovascular:     Rate and Rhythm: Normal rate and regular rhythm.     Heart sounds: Normal heart sounds.  Pulmonary:     Effort: Pulmonary effort is normal.     Breath sounds: Normal breath sounds.  Abdominal:     General: Bowel sounds are normal.     Palpations: Abdomen is soft.  Skin:    General: Skin is warm and dry.  Neurological:     Mental Status: She is alert and oriented to person, place, and time.  Breast exam: Patient is s/p right lumpectomy and sentinel lymph node biopsy with a surgical scar that appears to be healing well  CMP Latest Ref Rng & Units 01/16/2021  Glucose 70 - 99 mg/dL 127(H)  BUN 6 - 20 mg/dL 13  Creatinine 0.44 - 1.00 mg/dL 0.78  Sodium 135 - 145 mmol/L 137  Potassium 3.5 - 5.1 mmol/L 3.7  Chloride 98 - 111 mmol/L 105  CO2 22 - 32 mmol/L 23  Calcium 8.9 - 10.3 mg/dL 9.0  Total Protein 6.5 - 8.1 g/dL 7.2  Total Bilirubin 0.3 - 1.2 mg/dL 0.9  Alkaline Phos 38 - 126 U/L 64  AST 15 - 41 U/L 33  ALT 0 - 44 U/L 30   CBC Latest Ref Rng & Units 01/16/2021  WBC 4.0 - 10.5 K/uL 4.7  Hemoglobin 12.0 - 15.0 g/dL 12.6  Hematocrit 36.0 - 46.0 % 35.9(L)  Platelets 150 - 400 K/uL 107(L)    No images are attached to the encounter.  NM Sentinel Node Inj-No Rpt (Breast)  Result Date: 01/18/2021 Sulfur Colloid  was injected by the Nuclear Medicine Technologist for sentinel lymph node localization.   MM CLIP PLACEMENT RIGHT  Result Date: 01/01/2021 CLINICAL DATA:  Patient status post ultrasound-guided core needle biopsy right breast masses. EXAM: DIAGNOSTIC RIGHT MAMMOGRAM POST ULTRASOUND BIOPSY  COMPARISON:  Previous exam(s). FINDINGS: Mammographic images were obtained following ultrasound guided biopsy of right breast masses. Site 1: Right breast mass 12 o'clock position 1 cm from the nipple lateral: X shaped clip: In appropriate position. Site 2: Right breast mass 12 o'clock position 1 cm from the nipple medial: Vision clip: In appropriate position. IMPRESSION: Appropriate position biopsy marking clips as above. Final Assessment: Post Procedure Mammograms for Marker Placement Electronically Signed   By: Lovey Newcomer M.D.   On: 01/01/2021 12:05   [place] RT BREAST BX W LOC DEV 1ST LESION IMG BX SPEC US GUIDE  Addendum Date: 01/04/2021   ADDENDUM REPORT: 01/02/2021 13:38 ADDENDUM: PATHOLOGY revealed: Site A. RIGHT BREAST, LATERAL; ULTRASOUND-GUIDED NEEDLE CORE BIOPSY: - INVASIVE MAMMARY CARCINOMA, NO SPECIAL TYPE. Size of invasive carcinoma: 5 mm in this sample. Histologic grade of invasive carcinoma: Grade 2. Ductal carcinoma in situ: Not identified. Lymphovascular invasion: Not identified. Pathology results are CONCORDANT with imaging findings, per Dr. Lovey Newcomer. PATHOLOGY revealed: Site B. RIGHT BREAST, MEDIAL; ULTRASOUND-GUIDED NEEDLE CORE BIOPSY: - INVASIVE MAMMARY CARCINOMA, AS DESCRIBED ABOVE (SEE PART A). Pathology results are CONCORDANT with imaging findings, per Dr. Lovey Newcomer. Pathology results and recommendations below were discussed with patient by telephone on 01/02/2021. Patient reported biopsy site within normal limits with slight tenderness at the site. Post biopsy care instructions were reviewed, questions were answered and my direct phone number was provided to patient. Patient was instructed to call [facility] if any concerns or questions arise related to the biopsy. Recommend surgical consultation: Request for surgical consultation relayed to Al Pimple RN and Tanya Nones RN at [facility] by Electa Sniff RN on 01/02/2021. Recommend bilateral breast MRI.  Pathology results reported by Electa Sniff RN on 01/02/2021. Electronically Signed   By: Lovey Newcomer M.D.   On: 01/02/2021 13:38   Result Date: 01/04/2021 CLINICAL DATA:  Patient with indeterminate right breast masses 12 o'clock position, for biopsy. EXAM: ULTRASOUND GUIDED RIGHT BREAST CORE NEEDLE BIOPSY COMPARISON:  Previous exam(s). PROCEDURE: I met with the patient and we discussed the procedure of ultrasound-guided biopsy, including benefits and alternatives. We discussed the high likelihood of a successful procedure. We discussed the risks of the procedure, including infection, bleeding, tissue injury, clip migration, and inadequate sampling. Informed written consent was given. The usual time-out protocol was performed immediately prior to the procedure. Site 1: Right breast mass 12 o'clock position 1 cm from the nipple lateral: Lesion quadrant: Upper outer quadrant Using sterile technique and 1% Lidocaine as local anesthetic, under direct ultrasound visualization, a 14 gauge spring-loaded device was used to perform biopsy of right breast mass 12 o'clock position 1 cm from the nipple lateral using a lateral approach. At the conclusion of the procedure X shaped tissue marker clip was deployed into the biopsy cavity. Follow up 2 view mammogram was performed and dictated separately. Site 2: Right breast mass 12 o'clock position 1 cm from the nipple medial: Lesion quadrant: Upper inner quadrant Using sterile technique and 1% Lidocaine as local anesthetic, under direct ultrasound visualization, a 14 gauge spring-loaded device was used to perform biopsy of right breast mass 12 o'clock position 1 cm from the nipple medial using a lateral approach. At the conclusion of  the procedure vision shaped tissue marker clip was deployed into the biopsy cavity. Follow up 2 view mammogram was performed and dictated separately. IMPRESSION: Ultrasound guided biopsy of right breast masses as above. No apparent complications.  Electronically Signed: By: Lovey Newcomer M.D. On: 01/01/2021 12:10   [place] RT BREAST BX W LOC DEV EA ADD LESION IMG BX SPEC US GUIDE  Addendum Date: 01/04/2021   ADDENDUM REPORT: 01/02/2021 13:38 ADDENDUM: PATHOLOGY revealed: Site A. RIGHT BREAST, LATERAL; ULTRASOUND-GUIDED NEEDLE CORE BIOPSY: - INVASIVE MAMMARY CARCINOMA, NO SPECIAL TYPE. Size of invasive carcinoma: 5 mm in this sample. Histologic grade of invasive carcinoma: Grade 2. Ductal carcinoma in situ: Not identified. Lymphovascular invasion: Not identified. Pathology results are CONCORDANT with imaging findings, per Dr. Lovey Newcomer. PATHOLOGY revealed: Site B. RIGHT BREAST, MEDIAL; ULTRASOUND-GUIDED NEEDLE CORE BIOPSY: - INVASIVE MAMMARY CARCINOMA, AS DESCRIBED ABOVE (SEE PART A). Pathology results are CONCORDANT with imaging findings, per Dr. Lovey Newcomer. Pathology results and recommendations below were discussed with patient by telephone on 01/02/2021. Patient reported biopsy site within normal limits with slight tenderness at the site. Post biopsy care instructions were reviewed, questions were answered and my direct phone number was provided to patient. Patient was instructed to call [facility] if any concerns or questions arise related to the biopsy. Recommend surgical consultation: Request for surgical consultation relayed to Al Pimple RN and Tanya Nones RN at [facility] by Electa Sniff RN on 01/02/2021. Recommend bilateral breast MRI. Pathology results reported by Electa Sniff RN on 01/02/2021. Electronically Signed   By: Lovey Newcomer M.D.   On: 01/02/2021 13:38   Result Date: 01/04/2021 CLINICAL DATA:  Patient with indeterminate right breast masses 12 o'clock position, for biopsy. EXAM: ULTRASOUND GUIDED RIGHT BREAST CORE NEEDLE BIOPSY COMPARISON:  Previous exam(s). PROCEDURE: I met with the patient and we discussed the procedure of ultrasound-guided biopsy, including benefits and alternatives. We discussed the high  likelihood of a successful procedure. We discussed the risks of the procedure, including infection, bleeding, tissue injury, clip migration, and inadequate sampling. Informed written consent was given. The usual time-out protocol was performed immediately prior to the procedure. Site 1: Right breast mass 12 o'clock position 1 cm from the nipple lateral: Lesion quadrant: Upper outer quadrant Using sterile technique and 1% Lidocaine as local anesthetic, under direct ultrasound visualization, a 14 gauge spring-loaded device was used to perform biopsy of right breast mass 12 o'clock position 1 cm from the nipple lateral using a lateral approach. At the conclusion of the procedure X shaped tissue marker clip was deployed into the biopsy cavity. Follow up 2 view mammogram was performed and dictated separately. Site 2: Right breast mass 12 o'clock position 1 cm from the nipple medial: Lesion quadrant: Upper inner quadrant Using sterile technique and 1% Lidocaine as local anesthetic, under direct ultrasound visualization, a 14 gauge spring-loaded device was used to perform biopsy of right breast mass 12 o'clock position 1 cm from the nipple medial using a lateral approach. At the conclusion of the procedure vision shaped tissue marker clip was deployed into the biopsy cavity. Follow up 2 view mammogram was performed and dictated separately. IMPRESSION: Ultrasound guided biopsy of right breast masses as above. No apparent complications. Electronically Signed: By: Lovey Newcomer M.D. On: 01/01/2021 12:10     Assessment and plan- Patient is a 45 y.o. female with newly diagnosed pathological prognostic stage Ia invasive mammary carcinoma of the right breast MPT [id number] ER/PR positive HER2 negative here to  discuss final pathology results and further management  Discussed the results of final pathology with the patient which showed 2 tumors 7 mm in size which were morphologically similar.  There was no evidence of invasive  carcinoma or DCIS in between these 2 tumors.  2 out of 4 sentinel lymph nodes were positive for malignancy.  No evidence of extracapsular extension.  Final grading the tumor was grade 1 and ER/PR and HER2 which was done on the biopsy specimen showed greater than 90% ER positivity, greater than 90% PR positivity and HER2/neu was negative.  Given that patient is postmenopausal despite being less than 5 years of age with a grade 1 tumor, I would favor doing MammaPrint testing to determine if she would benefit from adjuvant chemotherapy despite being lymph node positive.  If MammaPrint shows that she is high risk I would recommend adjuvant chemotherapy with dose dense AC followed by 12 weekly cycles of Taxol.  However if the MammaPrint score shows low risk then I would not recommend chemotherapy for her.  She can proceed with adjuvant radiation treatment and I will also discuss with her hormone therapy in greater detail down the line.  She does understand that given that she has ER positive disease I would recommend 10 years of hormone therapy for her with AI.  Treatment will be given with a curative intent.  Discussed that staging scans would not be indicated for stage I ER positive breast cancer.  Patient and her husband verbalized understanding of the plan.  We will call the patient after the results of MammaPrint are back.  I am also checking a Ki-67 on her tumor specimen along with repeat biomarker testing on the second tumor as well  Cancer Staging Malignant neoplasm of right breast, stage 1, estrogen receptor positive ([place]) Staging form: Breast, AJCC 8th Edition - Pathologic stage from 01/29/2021: Stage IA (pT1b, pN1a, cM0, G1, ER+, PR+, HER2-) - Signed by Sindy Guadeloupe, MD on 01/29/2021 Stage prefix: Initial diagnosis Multigene prognostic tests performed: MammaPrint Histologic grading system: 3 grade system    Visit Diagnosis 1. Goals of care, counseling/discussion   2. Malignant neoplasm of  right breast, stage 1, estrogen receptor positive ([place])      Dr. Randa Evens, MD, MPH [facility] at [facility] [phone] 01/29/2021 12:29 PM

## 2021-01-30 ENCOUNTER — Other Ambulatory Visit: Payer: Self-pay | Admitting: *Deleted

## 2021-01-30 DIAGNOSIS — Z23 Encounter for immunization: Secondary | ICD-10-CM

## 2021-01-30 MED ORDER — ALPRAZOLAM 0.5 MG PO TABS: ORAL_TABLET | ORAL | 0 refills | Status: DC

## 2021-01-30 NOTE — Telephone Encounter (Signed)
Patient left a voicemail requesting a refill on her Xanax. Patient stated that her mom has passed away and was hoping to get a refill to help her thru this. Pharmacy [facility]

## 2021-01-30 NOTE — Telephone Encounter (Signed)
Refill sent to pharmacy.   I'm sorry for the loss of her mother. Would recommend appointment to check in on anxiety in the next 4-6 weeks.

## 2021-02-01 DIAGNOSIS — C50911 Malignant neoplasm of unspecified site of right female breast: Secondary | ICD-10-CM | POA: Diagnosis not present

## 2021-02-06 ENCOUNTER — Encounter: Payer: Self-pay | Admitting: Oncology

## 2021-02-06 LAB — SURGICAL PATHOLOGY

## 2021-02-07 ENCOUNTER — Encounter: Payer: Self-pay | Admitting: Oncology

## 2021-02-07 ENCOUNTER — Telehealth: Payer: Self-pay | Admitting: *Deleted

## 2021-02-07 NOTE — Telephone Encounter (Signed)
Called pt and let her know that dr Janese Banks just called to say that the results were low and pt does not need chemo. She will need radiation and she will be called with that appt as well as appt. For dr Janese Banks in 3-4 weeks. Pt agreeable to the plan

## 2021-02-13 DIAGNOSIS — C50911 Malignant neoplasm of unspecified site of right female breast: Secondary | ICD-10-CM | POA: Diagnosis not present

## 2021-02-14 ENCOUNTER — Ambulatory Visit
Admission: RE | Admit: 2021-02-14 | Discharge: 2021-02-14 | Disposition: A | Discharge status: Home / Self Care | Payer: BC Managed Care – PPO | Source: Ambulatory Visit | Attending: Radiation Oncology | Admitting: Radiation Oncology

## 2021-02-14 ENCOUNTER — Encounter: Payer: Self-pay | Admitting: Radiation Oncology

## 2021-02-14 DIAGNOSIS — I10 Essential (primary) hypertension: Secondary | ICD-10-CM | POA: Diagnosis not present

## 2021-02-14 DIAGNOSIS — D649 Anemia, unspecified: Secondary | ICD-10-CM | POA: Diagnosis not present

## 2021-02-14 DIAGNOSIS — C50911 Malignant neoplasm of unspecified site of right female breast: Secondary | ICD-10-CM | POA: Diagnosis not present

## 2021-02-14 DIAGNOSIS — K7460 Unspecified cirrhosis of liver: Secondary | ICD-10-CM | POA: Insufficient documentation

## 2021-02-14 DIAGNOSIS — Z86010 Personal history of colonic polyps: Secondary | ICD-10-CM | POA: Diagnosis not present

## 2021-02-14 DIAGNOSIS — Z87891 Personal history of nicotine dependence: Secondary | ICD-10-CM | POA: Diagnosis not present

## 2021-02-14 DIAGNOSIS — Z79899 Other long term (current) drug therapy: Secondary | ICD-10-CM | POA: Diagnosis not present

## 2021-02-14 DIAGNOSIS — E785 Hyperlipidemia, unspecified: Secondary | ICD-10-CM | POA: Diagnosis not present

## 2021-02-14 DIAGNOSIS — R011 Cardiac murmur, unspecified: Secondary | ICD-10-CM | POA: Insufficient documentation

## 2021-02-14 DIAGNOSIS — Z8711 Personal history of peptic ulcer disease: Secondary | ICD-10-CM | POA: Diagnosis not present

## 2021-02-14 DIAGNOSIS — Z170 Estrogen receptor positive status [ER+]: Secondary | ICD-10-CM | POA: Diagnosis not present

## 2021-02-14 NOTE — Consult Note (Signed)
NEW PATIENT EVALUATION  Name: Morgan Sanders  MRN: [id number]  Date:   02/14/2021     DOB: 1976-07-28   This 45 y.o. female patient presents to the clinic for initial evaluation of stage T1b N1 aM0 pathologic staging ER/PR positive HER2/neu negative.  Status post wide local excision of the right breast with low risk luminal type a on MammaPrint  REFERRING PHYSICIAN: Lesleigh Noe, MD  CHIEF COMPLAINT:  Chief Complaint  Patient presents with   Breast Cancer    Initial consultation    DIAGNOSIS: The encounter diagnosis was Malignant neoplasm of right breast, stage 1, estrogen receptor positive ([place]).   PREVIOUS INVESTIGATIONS:  Mammogram and ultrasound reviewed Pathology report reviewed Clinical notes reviewed  HPI: Patient is a 45 year old female with a history of normocytic anemia and as well as a history of cirrhosis of the liver.  She underwent screening mammogram in May 2022 showing a possible 2 masses in the right breast at the 12 o'clock position 1 cm from nipple.  There was also 2 irregular hypoechoic masses in this region highly suspicious for malignancy.  She had biopsy under ultrasound guidance of both masses showing invasive mammary carcinoma grade 2 ER/PR positive HER2/neu not overexpressed.  She underwent a wide local excision showing overall grade 1 invasive mammary carcinoma of no special type.  Both tumors were 7 mm in greatest dimension.  Ductal carcinoma in situ was present.  Although margins were clear.  Margins for her breast cancer invasive component was 1.5 mm and 4 mm for DCIS.  She had 4 sentinel lymph nodes examined 1 with macro metastatic disease greater than 2 mm 1 with micrometastatic disease.  Largest deposit was 6 mm.  Extranodal extension was not identified.  Tumor was ER/PR positive HER2/neu not overexpressed.  MammaPrint was performed showing low risk luminal type A disease with low benefit to chemotherapy.  She has been seen by medical oncology and  chemotherapy has been declined.  She will be on antiestrogen therapy she is now referred to radiation oncology for evaluation.  She is doing well.  She specifically denies breast tenderness cough or bone pain.  PLANNED TREATMENT REGIMEN: Right whole breast and peripheral lymphatic radiation  PAST MEDICAL HISTORY:  has a past medical history of Allergy, Anemia, Asthma, Blood transfusion without reported diagnosis, Chicken pox, Colon polyps, Depression, Heart murmur, Hypertension, Neuromuscular disorder ([place]), Pneumonia, and Stomach ulcer.    PAST SURGICAL HISTORY:  Past Surgical History:  Procedure Laterality Date   BREAST BIOPSY Right 01/01/2021   u/s bx 12:00 1 cmfn path pending x clip lateral   BREAST BIOPSY Right 01/01/2021   u/s bx 12:00 1cmfn path pending vision medial   BREAST LUMPECTOMY WITH SENTINEL LYMPH NODE BIOPSY Right 01/18/2021   Procedure: BREAST LUMPECTOMY WITH SENTINEL LYMPH NODE BX;  Surgeon: Robert Bellow, MD;  Location: [facility] ORS;  Service: General;  Laterality: Right;   CHOLECYSTECTOMY     CHOLECYSTECTOMY, LAPAROSCOPIC  03/1996   COLONOSCOPY     ESOPHAGOGASTRODUODENOSCOPY (EGD) WITH PROPOFOL N/A 12/31/2018   Procedure: ESOPHAGOGASTRODUODENOSCOPY (EGD) WITH PROPOFOL;  Surgeon: Lucilla Lame, MD;  Location: [facility] END[facility];  Service: Endoscopy;  Laterality: N/A;   TUBAL LIGATION     WISDOM TOOTH EXTRACTION      FAMILY HISTORY: family history includes Arthritis in her maternal grandmother and mother; Asthma in her mother; Bone cancer in her maternal great-grandmother; Breast cancer (age of onset: 50) in her maternal grandmother; Cancer in her maternal grandmother; Cataracts in her  paternal grandfather; Depression in her daughter, daughter, and mother; Diabetes in her maternal grandmother and mother; Early death in her father and maternal grandfather; Hearing loss in her maternal grandmother; Hyperlipidemia in her mother and sister; Hypertension in her maternal grandmother,  mother, and sister; Kidney disease in her mother; Liver disease in her father; Lung cancer in her paternal grandfather; Sudden death in her father.  SOCIAL HISTORY:  reports that she has quit smoking. Her smoking use included cigarettes. She has a 20.00 pack-year smoking history. She has never used smokeless tobacco. She reports current alcohol use. She reports that she does not use drugs.  ALLERGIES: Patient has no known allergies.  MEDICATIONS:  Current Outpatient Medications  Medication Sig Dispense Refill   albuterol (VENTOLIN HFA) 108 (90 Base) MCG/ACT inhaler INHALE 2 PUFFS INTO THE LUNGS EVERY 4 (FOUR) HOURS AS NEEDED FOR SHORTNESS OF BREATH OR WHEEZING 8.5 each 2   ALPRAZolam (XANAX) 0.5 MG tablet TAKE 1 TABLET BY MOUTH AS NEEDED FOR ANXIETY. 10 tablet 0   Biotin w/ Vitamins C & E (HAIR/SKIN/NAILS PO) Take 1 tablet by mouth daily.     Cholecalciferol (VITAMIN D3) 125 MCG (5000 UT) CAPS Take 5,000 Units by mouth daily.     escitalopram (LEXAPRO) 20 MG tablet TAKE 1 TABLET BY MOUTH EVERY DAY (Patient taking differently: Take 20 mg by mouth daily.) 90 tablet 3   famotidine (PEPCID) 20 MG tablet Take 20 mg by mouth 2 (two) times daily.     loratadine (CLARITIN) 10 MG tablet Take 10 mg by mouth daily.     MAG-G 500 (27 Mg) MG TABS TAKE 1 TABLET (500MG) BY MOUTH EVERY DAY (Patient taking differently: Take 500 mg by mouth daily.) 30 tablet 3   Potassium Chloride ER 20 MEQ TBCR TAKE 1 TABLET BY MOUTH EVERY DAY (Patient taking differently: Take 20 mEq by mouth daily.) 90 tablet 1   pregabalin (LYRICA) 300 MG capsule Take 300 mg by mouth 2 (two) times daily.     pyridOXINE (VITAMIN B-6) 100 MG tablet Take 100 mg by mouth daily.     thiamine (VITAMIN B-1) 100 MG tablet Take 250 mg by mouth daily.      vitamin B-12 (CYANOCOBALAMIN) 500 MCG tablet Take 2,500 mcg by mouth daily.     No current facility-administered medications for this encounter.    ECOG PERFORMANCE STATUS:  0 -  Asymptomatic  REVIEW OF SYSTEMS: Patient does have history of normocytic anemia as well as cirrhosis of the liver. Patient denies any weight loss, fatigue, weakness, fever, chills or night sweats. Patient denies any loss of vision, blurred vision. Patient denies any ringing  of the ears or hearing loss. No irregular heartbeat. Patient denies heart murmur or history of fainting. Patient denies any chest pain or pain radiating to her upper extremities. Patient denies any shortness of breath, difficulty breathing at night, cough or hemoptysis. Patient denies any swelling in the lower legs. Patient denies any nausea vomiting, vomiting of blood, or coffee ground material in the vomitus. Patient denies any stomach pain. Patient states has had normal bowel movements no significant constipation or diarrhea. Patient denies any dysuria, hematuria or significant nocturia. Patient denies any problems walking, swelling in the joints or loss of balance. Patient denies any skin changes, loss of hair or loss of weight. Patient denies any excessive worrying or anxiety or significant depression. Patient denies any problems with insomnia. Patient denies excessive thirst, polyuria, polydipsia. Patient denies any swollen glands, patient denies easy  bruising or easy bleeding. Patient denies any recent infections, allergies or URI. Patient "s visual fields have not changed significantly in recent time.   PHYSICAL EXAM: BP (!) (P) 159/85 (BP Location: Left Arm, Patient Position: Sitting)   Pulse (P) 63   Temp (!) (P) 97 F (36.1 C) (Tympanic)   Resp (P) 12   Wt (P) 194 lb (88 kg)   BMI (P) 30.38 kg/m  No dominant mass or nodularity is noted in either breast.  Right breast incisions are well-healed.  No axillary or supraclavicular adenopathy is identified.  Well-developed well-nourished patient in NAD. HEENT reveals PERLA, EOMI, discs not visualized.  Oral cavity is clear. No oral mucosal lesions are identified. Neck is clear  without evidence of cervical or supraclavicular adenopathy. Lungs are clear to A&P. Cardiac examination is essentially unremarkable with regular rate and rhythm without murmur rub or thrill. Abdomen is benign with no organomegaly or masses noted. Motor sensory and DTR levels are equal and symmetric in the upper and lower extremities. Cranial nerves II through XII are grossly intact. Proprioception is intact. No peripheral adenopathy or edema is identified. No motor or sensory levels are noted. Crude visual fields are within normal range.  LABORATORY DATA: Pathology report reviewed MammaPrint report also reviewed    RADIOLOGY RESULTS: Mammogram and ultrasound reviewed compatible with above-stated findings   IMPRESSION: Stage T1b N1 aM0 pathologic staging ER/PR positive HER2 negative invasive mammary carcinoma the right breast status post wide local excision and sentinel node biopsy in 45 year old female  PLAN: This time I recommended right whole breast and peripheral lymphatic radiation.  Would treat both sites to 5040 cGy in 28 fractions.  Based on her closed margin would also boost her scar another 1600 cGy using electron beam therapy.  Risks and benefits of treatment occluding skin reaction fatigue alteration of blood counts possible inclusion of superficial lung and slight chance of lymphedema all were discussed in detail I have emphasized she needs to exercise her arm is much as possible during and after treatments.  Patient also be benefit from antiestrogen therapy after completion of treatment.  Patient comprehends my recommendations well I have personally set up and ordered CT simulation for next week.  I would like to take this opportunity to thank you for allowing me to participate in the care of your patient.Noreene Filbert, MD

## 2021-02-15 ENCOUNTER — Institutional Professional Consult (permissible substitution): Payer: BC Managed Care – PPO | Admitting: Radiation Oncology

## 2021-02-20 ENCOUNTER — Ambulatory Visit
Admission: RE | Admit: 2021-02-20 | Discharge: 2021-02-20 | Disposition: A | Discharge status: Home / Self Care | Payer: BC Managed Care – PPO | Source: Ambulatory Visit | Attending: Radiation Oncology | Admitting: Radiation Oncology

## 2021-02-20 DIAGNOSIS — Z510 Encounter for antineoplastic radiation therapy: Secondary | ICD-10-CM | POA: Insufficient documentation

## 2021-02-20 DIAGNOSIS — C773 Secondary and unspecified malignant neoplasm of axilla and upper limb lymph nodes: Secondary | ICD-10-CM | POA: Insufficient documentation

## 2021-02-20 DIAGNOSIS — C50911 Malignant neoplasm of unspecified site of right female breast: Secondary | ICD-10-CM | POA: Diagnosis not present

## 2021-02-20 DIAGNOSIS — Z170 Estrogen receptor positive status [ER+]: Secondary | ICD-10-CM | POA: Insufficient documentation

## 2021-02-22 ENCOUNTER — Other Ambulatory Visit: Payer: Self-pay | Admitting: *Deleted

## 2021-02-22 DIAGNOSIS — Z170 Estrogen receptor positive status [ER+]: Secondary | ICD-10-CM

## 2021-02-22 DIAGNOSIS — C50911 Malignant neoplasm of unspecified site of right female breast: Secondary | ICD-10-CM

## 2021-02-25 DIAGNOSIS — C50911 Malignant neoplasm of unspecified site of right female breast: Secondary | ICD-10-CM | POA: Diagnosis not present

## 2021-02-25 DIAGNOSIS — Z170 Estrogen receptor positive status [ER+]: Secondary | ICD-10-CM | POA: Diagnosis not present

## 2021-02-25 DIAGNOSIS — Z510 Encounter for antineoplastic radiation therapy: Secondary | ICD-10-CM | POA: Diagnosis not present

## 2021-02-25 DIAGNOSIS — C773 Secondary and unspecified malignant neoplasm of axilla and upper limb lymph nodes: Secondary | ICD-10-CM | POA: Diagnosis not present

## 2021-02-27 ENCOUNTER — Ambulatory Visit: Admission: RE | Admit: 2021-02-27 | Payer: BC Managed Care – PPO | Source: Ambulatory Visit

## 2021-02-27 DIAGNOSIS — Z510 Encounter for antineoplastic radiation therapy: Secondary | ICD-10-CM | POA: Diagnosis not present

## 2021-02-27 DIAGNOSIS — Z170 Estrogen receptor positive status [ER+]: Secondary | ICD-10-CM | POA: Diagnosis not present

## 2021-02-27 DIAGNOSIS — C50911 Malignant neoplasm of unspecified site of right female breast: Secondary | ICD-10-CM | POA: Diagnosis not present

## 2021-02-27 DIAGNOSIS — C773 Secondary and unspecified malignant neoplasm of axilla and upper limb lymph nodes: Secondary | ICD-10-CM | POA: Diagnosis not present

## 2021-02-28 ENCOUNTER — Ambulatory Visit
Admission: RE | Admit: 2021-02-28 | Discharge: 2021-02-28 | Disposition: A | Discharge status: Home / Self Care | Payer: BC Managed Care – PPO | Source: Ambulatory Visit | Attending: Radiation Oncology | Admitting: Radiation Oncology

## 2021-02-28 DIAGNOSIS — C773 Secondary and unspecified malignant neoplasm of axilla and upper limb lymph nodes: Secondary | ICD-10-CM | POA: Diagnosis not present

## 2021-02-28 DIAGNOSIS — C50911 Malignant neoplasm of unspecified site of right female breast: Secondary | ICD-10-CM | POA: Diagnosis not present

## 2021-02-28 DIAGNOSIS — Z170 Estrogen receptor positive status [ER+]: Secondary | ICD-10-CM | POA: Diagnosis not present

## 2021-02-28 DIAGNOSIS — Z510 Encounter for antineoplastic radiation therapy: Secondary | ICD-10-CM | POA: Diagnosis not present

## 2021-03-01 ENCOUNTER — Ambulatory Visit
Admission: RE | Admit: 2021-03-01 | Discharge: 2021-03-01 | Disposition: A | Discharge status: Home / Self Care | Payer: BC Managed Care – PPO | Source: Ambulatory Visit | Attending: Radiation Oncology | Admitting: Radiation Oncology

## 2021-03-01 DIAGNOSIS — Z170 Estrogen receptor positive status [ER+]: Secondary | ICD-10-CM | POA: Diagnosis not present

## 2021-03-01 DIAGNOSIS — Z510 Encounter for antineoplastic radiation therapy: Secondary | ICD-10-CM | POA: Diagnosis not present

## 2021-03-01 DIAGNOSIS — C50911 Malignant neoplasm of unspecified site of right female breast: Secondary | ICD-10-CM | POA: Diagnosis not present

## 2021-03-01 DIAGNOSIS — C773 Secondary and unspecified malignant neoplasm of axilla and upper limb lymph nodes: Secondary | ICD-10-CM | POA: Diagnosis not present

## 2021-03-04 ENCOUNTER — Ambulatory Visit
Admission: RE | Admit: 2021-03-04 | Discharge: 2021-03-04 | Disposition: A | Discharge status: Home / Self Care | Payer: BC Managed Care – PPO | Source: Ambulatory Visit | Attending: Radiation Oncology | Admitting: Radiation Oncology

## 2021-03-04 DIAGNOSIS — C50911 Malignant neoplasm of unspecified site of right female breast: Secondary | ICD-10-CM | POA: Diagnosis not present

## 2021-03-04 DIAGNOSIS — C773 Secondary and unspecified malignant neoplasm of axilla and upper limb lymph nodes: Secondary | ICD-10-CM | POA: Diagnosis not present

## 2021-03-04 DIAGNOSIS — Z170 Estrogen receptor positive status [ER+]: Secondary | ICD-10-CM | POA: Diagnosis not present

## 2021-03-04 DIAGNOSIS — Z510 Encounter for antineoplastic radiation therapy: Secondary | ICD-10-CM | POA: Diagnosis not present

## 2021-03-05 ENCOUNTER — Inpatient Hospital Stay: Payer: BC Managed Care – PPO | Attending: Oncology | Admitting: Oncology

## 2021-03-05 ENCOUNTER — Encounter: Payer: Self-pay | Admitting: Oncology

## 2021-03-05 ENCOUNTER — Ambulatory Visit
Admission: RE | Admit: 2021-03-05 | Discharge: 2021-03-05 | Disposition: A | Discharge status: Home / Self Care | Payer: BC Managed Care – PPO | Source: Ambulatory Visit | Attending: Radiation Oncology | Admitting: Radiation Oncology

## 2021-03-05 VITALS — BP 173/83 | HR 83 | Temp 97.4°F | Resp 16 | Wt 193.0 lb

## 2021-03-05 DIAGNOSIS — Z7189 Other specified counseling: Secondary | ICD-10-CM

## 2021-03-05 DIAGNOSIS — C773 Secondary and unspecified malignant neoplasm of axilla and upper limb lymph nodes: Secondary | ICD-10-CM | POA: Diagnosis not present

## 2021-03-05 DIAGNOSIS — Z170 Estrogen receptor positive status [ER+]: Secondary | ICD-10-CM | POA: Insufficient documentation

## 2021-03-05 DIAGNOSIS — Z780 Asymptomatic menopausal state: Secondary | ICD-10-CM | POA: Diagnosis not present

## 2021-03-05 DIAGNOSIS — Z510 Encounter for antineoplastic radiation therapy: Secondary | ICD-10-CM | POA: Diagnosis not present

## 2021-03-05 DIAGNOSIS — C50911 Malignant neoplasm of unspecified site of right female breast: Secondary | ICD-10-CM

## 2021-03-05 DIAGNOSIS — Z87891 Personal history of nicotine dependence: Secondary | ICD-10-CM | POA: Diagnosis not present

## 2021-03-05 MED ORDER — LETROZOLE 2.5 MG PO TABS: 2.5000 mg | ORAL_TABLET | Freq: Every day | ORAL | 3 refills | Status: DC

## 2021-03-05 NOTE — Progress Notes (Signed)
sometimes has HA from coming out of heat and laying down for radiation. No pain todaY

## 2021-03-05 NOTE — Progress Notes (Signed)
Hematology/Oncology Consult note [facility]  [phone] [phone]  Patient Care Team: Lesleigh Noe, MD as PCP - General (Family Medicine) Reeves Forth (Gastroenterology) Sharlet Salina, MD as Referring Physician (Physical Medicine and Rehabilitation) Maximiano Coss, Loraine Grip, MD (Neurology) Theodore Demark, RN as Oncology Nurse Navigator   Name of the patient: Morgan Sanders  [id number]  1976-02-19   Date of visit: 03/05/21  Diagnosis- pathological prognostic stage Ia invasive mammary carcinoma of the right breast and pT1b pN1 acM0 ER/PR positive HER2 negative  Chief complaint/ Reason for visit- discuss hormone therapy for breast cancer  Heme/Onc history: Patient is a 45 year old post menopausal female with a prior history of normocytic anemia for which she had a complete work-up including bone marrow biopsy as well as history of cirrhosis of the liver.  She is postmenopausal and she has not had any menstrual cycles since the age of 19.  Patient underwent a bilateral screening mammogram on 12/21/2020 which showed possible mass in the right breast.  This was followed by diagnostic mammogram and ultrasound which showed 2 masses in the right breast at 12 o'clock position each measuring 6 mm and 1.2 cm apart.  Ultrasound of the right axilla was negative for malignancy.  Patient underwent biopsy of both the medial and the lateral breast mass and it was consistent with invasive mammary carcinoma grade 2.  ER greater than 90% positive, PR greater than 90% positive and HER2 negative   Patient underwent right lumpectomy with sentinel lymph node biopsy Final pathology showed 2 tumors 7 mm in size both of which were grade 1.  The tissue in between did not have any evidence of malignancy or DCIS.  She was noted to have metastatic carcinoma in 2 out of 4 sentinel lymph node 6 mm deposit with no extracapsular extension.   MammaPrint score came back  at low risk and therefore patient did not require any adjuvant chemotherapy.  She has now started adjuvant radiation treatment  Interval history-patient is so far tolerating radiation well without any significant side effects.  ECOG PS- 0 Pain scale- 0  Review of systems- Review of Systems  Constitutional:  Negative for chills, fever, malaise/fatigue and weight loss.  HENT:  Negative for congestion, ear discharge and nosebleeds.   Eyes:  Negative for blurred vision.  Respiratory:  Negative for cough, hemoptysis, sputum production, shortness of breath and wheezing.   Cardiovascular:  Negative for chest pain, palpitations, orthopnea and claudication.  Gastrointestinal:  Negative for abdominal pain, blood in stool, constipation, diarrhea, heartburn, melena, nausea and vomiting.  Genitourinary:  Negative for dysuria, flank pain, frequency, hematuria and urgency.  Musculoskeletal:  Negative for back pain, joint pain and myalgias.  Skin:  Negative for rash.  Neurological:  Negative for dizziness, tingling, focal weakness, seizures, weakness and headaches.  Endo/Heme/Allergies:  Does not bruise/bleed easily.  Psychiatric/Behavioral:  Negative for depression and suicidal ideas. The patient does not have insomnia.       No Known Allergies   Past Medical History:  Diagnosis Date   Allergy    Anemia    Asthma    Blood transfusion without reported diagnosis    Chicken pox    Colon polyps    Depression    Heart murmur    Hypertension    Neuromuscular disorder ([place])    neuropathy   Pneumonia    Stomach ulcer      Past Surgical History:  Procedure Laterality Date  BREAST BIOPSY Right 01/01/2021   u/s bx 12:00 1 cmfn path pending x clip lateral   BREAST BIOPSY Right 01/01/2021   u/s bx 12:00 1cmfn path pending vision medial   BREAST LUMPECTOMY WITH SENTINEL LYMPH NODE BIOPSY Right 01/18/2021   Procedure: BREAST LUMPECTOMY WITH SENTINEL LYMPH NODE BX;  Surgeon: Robert Bellow,  MD;  Location: [facility] ORS;  Service: General;  Laterality: Right;   CHOLECYSTECTOMY     CHOLECYSTECTOMY, LAPAROSCOPIC  03/1996   COLONOSCOPY     ESOPHAGOGASTRODUODENOSCOPY (EGD) WITH PROPOFOL N/A 12/31/2018   Procedure: ESOPHAGOGASTRODUODENOSCOPY (EGD) WITH PROPOFOL;  Surgeon: Lucilla Lame, MD;  Location: [facility] END[facility];  Service: Endoscopy;  Laterality: N/A;   TUBAL LIGATION     WISDOM TOOTH EXTRACTION      Social History   Socioeconomic History   Marital status: Married    Spouse name: [name]   Number of children: 2   Years of education: high school   Highest education level: 12th grade  Occupational History   Occupation: Golf/clubhouse attendent  Tobacco Use   Smoking status: Former    Packs/day: 1.00    Years: 20.00    Pack years: 20.00    Types: Cigarettes   Smokeless tobacco: Never  Vaping Use   Vaping Use: Some days   Substances: Nicotine, Flavoring  Substance and Sexual Activity   Alcohol use: Yes    Comment: a few times a year   Drug use: Never   Sexual activity: Not Currently  Other Topics Concern   Not on file  Social History Narrative   02/07/20   From: the area   Living: with husband, [name] (2000)   Work: at a golf course      Family: daughters - CJ (lives at [employer], 2 children) and Art gallery manager (still living at home, in Sports coach school)      Enjoys: golf       Exercise: golfing 3-4 times a week - alternating between cart/walking   Diet: pretty good - eggs/fruit, eats lunch and dinner      Safety   Seat belts: Yes    Guns: Yes  and secure   Safe in relationships: Yes    Social Determinants of Radio broadcast assistant Strain: Not on file  Food Insecurity: Not on file  Transportation Needs: Not on file  Physical Activity: Not on file  Stress: Not on file  Social Connections: Not on file  Intimate Partner Violence: Not on file    Family History  Problem Relation Age of Onset   Breast cancer Maternal Grandmother 78   Arthritis Maternal Grandmother     Cancer Maternal Grandmother    Diabetes Maternal Grandmother    Hearing loss Maternal Grandmother    Hypertension Maternal Grandmother    Bone cancer Maternal Great-grandmother    Hypertension Mother    Diabetes Mother    Hyperlipidemia Mother    Kidney disease Mother    Arthritis Mother    Asthma Mother    Depression Mother    Sudden death Father        hit by train   Liver disease Father        hx of liver transplant   Early death Father    Cataracts Paternal Grandfather    Lung cancer Paternal Grandfather    Hyperlipidemia Sister    Hypertension Sister    Depression Daughter    Early death Maternal Grandfather    Depression Daughter      Current Outpatient  Medications:    albuterol (VENTOLIN HFA) 108 (90 Base) MCG/ACT inhaler, INHALE 2 PUFFS INTO THE LUNGS EVERY 4 (FOUR) HOURS AS NEEDED FOR SHORTNESS OF BREATH OR WHEEZING, Disp: 8.5 each, Rfl: 2   ALPRAZolam (XANAX) 0.5 MG tablet, TAKE 1 TABLET BY MOUTH AS NEEDED FOR ANXIETY., Disp: 10 tablet, Rfl: 0   Biotin w/ Vitamins C & E (HAIR/SKIN/NAILS PO), Take 1 tablet by mouth daily., Disp: , Rfl:    Cholecalciferol (VITAMIN D3) 125 MCG (5000 UT) CAPS, Take 5,000 Units by mouth daily., Disp: , Rfl:    escitalopram (LEXAPRO) 20 MG tablet, TAKE 1 TABLET BY MOUTH EVERY DAY (Patient taking differently: Take 20 mg by mouth daily.), Disp: 90 tablet, Rfl: 3   famotidine (PEPCID) 20 MG tablet, Take 20 mg by mouth 2 (two) times daily., Disp: , Rfl:    loratadine (CLARITIN) 10 MG tablet, Take 10 mg by mouth daily., Disp: , Rfl:    MAG-G 500 (27 Mg) MG TABS, TAKE 1 TABLET (500MG) BY MOUTH EVERY DAY (Patient taking differently: Take 500 mg by mouth daily.), Disp: 30 tablet, Rfl: 3   Potassium Chloride ER 20 MEQ TBCR, TAKE 1 TABLET BY MOUTH EVERY DAY (Patient taking differently: Take 20 mEq by mouth daily.), Disp: 90 tablet, Rfl: 1   pregabalin (LYRICA) 300 MG capsule, Take 300 mg by mouth 2 (two) times daily., Disp: , Rfl:    pyridOXINE  (VITAMIN B-6) 100 MG tablet, Take 100 mg by mouth daily., Disp: , Rfl:    thiamine (VITAMIN B-1) 100 MG tablet, Take 250 mg by mouth daily. , Disp: , Rfl:    vitamin B-12 (CYANOCOBALAMIN) 500 MCG tablet, Take 2,500 mcg by mouth daily., Disp: , Rfl:   Physical exam:  Vitals:   03/05/21 1438  BP: (!) 173/83  Pulse: 83  Resp: 16  Temp: (!) 97.4 F (36.3 C)  TempSrc: Oral  Weight: 193 lb (87.5 kg)   Physical Exam Constitutional:      General: She is not in acute distress. Cardiovascular:     Rate and Rhythm: Normal rate and regular rhythm.     Heart sounds: Normal heart sounds.  Pulmonary:     Effort: Pulmonary effort is normal.     Breath sounds: Normal breath sounds.  Abdominal:     General: Bowel sounds are normal.     Palpations: Abdomen is soft.  Skin:    General: Skin is warm and dry.  Neurological:     Mental Status: She is alert and oriented to person, place, and time.     CMP Latest Ref Rng & Units 01/16/2021  Glucose 70 - 99 mg/dL 127(H)  BUN 6 - 20 mg/dL 13  Creatinine 0.44 - 1.00 mg/dL 0.78  Sodium 135 - 145 mmol/L 137  Potassium 3.5 - 5.1 mmol/L 3.7  Chloride 98 - 111 mmol/L 105  CO2 22 - 32 mmol/L 23  Calcium 8.9 - 10.3 mg/dL 9.0  Total Protein 6.5 - 8.1 g/dL 7.2  Total Bilirubin 0.3 - 1.2 mg/dL 0.9  Alkaline Phos 38 - 126 U/L 64  AST 15 - 41 U/L 33  ALT 0 - 44 U/L 30   CBC Latest Ref Rng & Units 01/16/2021  WBC 4.0 - 10.5 K/uL 4.7  Hemoglobin 12.0 - 15.0 g/dL 12.6  Hematocrit 36.0 - 46.0 % 35.9(L)  Platelets 150 - 400 K/uL 107(L)      Assessment and plan- Patient is a 45 y.o. female with pathological prognostic stage Ia invasive  mammary carcinoma of the right breast MPT [id number] ER/PR positive HER2 negative.  MammaPrint came back at low risk and patient did not require adjuvant chemotherapy.  She is currently undergoing adjuvant radiation treatment.  She is here to discuss hormone therapy for breast cancer  Again discussed the results of final  pathology as well as MammaPrint testing which came back at low risk and therefore patient did not require any adjuvant chemotherapy.  She currently undergoing adjuvant radiation treatment.  Patient's tumor was strongly positive for ER and PR and therefore I would recommend starting letrozole 2.5 mg daily after completing radiation treatment.  Also recommend that she takes over-the-counter calcium tablets 1200 mg along with vitamin D 800 international units prophylactically.  Discussed risks and benefits of letrozole including all but not limited to fatigue, hot flashes, arthralgias and the need to monitorBone density scans.  We will need to obtain baseline bone density scan at this time and I will see her back in 10 weeks  I have also asked for pathology to check a Ki-67 on her tumor specimen.  If it is more than 20% she would also be a candidate for adjuvant abemaciclib.  Patient has not had any menstrual cycles for 3 years now but I will check her hormone levels FSH and LH tomorrow   Visit Diagnosis 1. Goals of care, counseling/discussion      Dr. Randa Evens, MD, MPH [facility] at [facility] [phone] 03/05/2021 7:14 PM

## 2021-03-06 ENCOUNTER — Inpatient Hospital Stay: Payer: BC Managed Care – PPO

## 2021-03-06 ENCOUNTER — Ambulatory Visit
Admission: RE | Admit: 2021-03-06 | Discharge: 2021-03-06 | Disposition: A | Discharge status: Home / Self Care | Payer: BC Managed Care – PPO | Source: Ambulatory Visit | Attending: Radiation Oncology | Admitting: Radiation Oncology

## 2021-03-06 DIAGNOSIS — C773 Secondary and unspecified malignant neoplasm of axilla and upper limb lymph nodes: Secondary | ICD-10-CM | POA: Diagnosis not present

## 2021-03-06 DIAGNOSIS — Z510 Encounter for antineoplastic radiation therapy: Secondary | ICD-10-CM | POA: Diagnosis not present

## 2021-03-06 DIAGNOSIS — Z170 Estrogen receptor positive status [ER+]: Secondary | ICD-10-CM | POA: Diagnosis not present

## 2021-03-06 DIAGNOSIS — C50911 Malignant neoplasm of unspecified site of right female breast: Secondary | ICD-10-CM | POA: Diagnosis not present

## 2021-03-07 ENCOUNTER — Other Ambulatory Visit: Payer: Self-pay | Admitting: Family Medicine

## 2021-03-07 ENCOUNTER — Ambulatory Visit
Admission: RE | Admit: 2021-03-07 | Discharge: 2021-03-07 | Disposition: A | Discharge status: Home / Self Care | Payer: BC Managed Care – PPO | Source: Ambulatory Visit | Attending: Radiation Oncology | Admitting: Radiation Oncology

## 2021-03-07 DIAGNOSIS — Z510 Encounter for antineoplastic radiation therapy: Secondary | ICD-10-CM | POA: Diagnosis not present

## 2021-03-07 DIAGNOSIS — G629 Polyneuropathy, unspecified: Secondary | ICD-10-CM

## 2021-03-07 DIAGNOSIS — Z170 Estrogen receptor positive status [ER+]: Secondary | ICD-10-CM | POA: Diagnosis not present

## 2021-03-07 DIAGNOSIS — C50911 Malignant neoplasm of unspecified site of right female breast: Secondary | ICD-10-CM | POA: Diagnosis not present

## 2021-03-07 DIAGNOSIS — C773 Secondary and unspecified malignant neoplasm of axilla and upper limb lymph nodes: Secondary | ICD-10-CM | POA: Diagnosis not present

## 2021-03-08 ENCOUNTER — Ambulatory Visit: Payer: BC Managed Care – PPO

## 2021-03-09 DIAGNOSIS — Z170 Estrogen receptor positive status [ER+]: Secondary | ICD-10-CM | POA: Diagnosis not present

## 2021-03-09 DIAGNOSIS — C773 Secondary and unspecified malignant neoplasm of axilla and upper limb lymph nodes: Secondary | ICD-10-CM | POA: Diagnosis not present

## 2021-03-09 DIAGNOSIS — C50911 Malignant neoplasm of unspecified site of right female breast: Secondary | ICD-10-CM | POA: Diagnosis not present

## 2021-03-09 DIAGNOSIS — Z510 Encounter for antineoplastic radiation therapy: Secondary | ICD-10-CM | POA: Diagnosis not present

## 2021-03-11 ENCOUNTER — Ambulatory Visit
Admission: RE | Admit: 2021-03-11 | Discharge: 2021-03-11 | Disposition: A | Discharge status: Home / Self Care | Payer: BC Managed Care – PPO | Source: Ambulatory Visit | Attending: Radiation Oncology | Admitting: Radiation Oncology

## 2021-03-11 ENCOUNTER — Ambulatory Visit: Admission: RE | Admit: 2021-03-11 | Payer: BC Managed Care – PPO | Source: Ambulatory Visit

## 2021-03-11 DIAGNOSIS — Z510 Encounter for antineoplastic radiation therapy: Secondary | ICD-10-CM | POA: Diagnosis not present

## 2021-03-11 DIAGNOSIS — C50911 Malignant neoplasm of unspecified site of right female breast: Secondary | ICD-10-CM | POA: Diagnosis not present

## 2021-03-11 DIAGNOSIS — Z170 Estrogen receptor positive status [ER+]: Secondary | ICD-10-CM | POA: Diagnosis not present

## 2021-03-11 DIAGNOSIS — C773 Secondary and unspecified malignant neoplasm of axilla and upper limb lymph nodes: Secondary | ICD-10-CM | POA: Diagnosis not present

## 2021-03-12 ENCOUNTER — Ambulatory Visit
Admission: RE | Admit: 2021-03-12 | Discharge: 2021-03-12 | Disposition: A | Discharge status: Home / Self Care | Payer: BC Managed Care – PPO | Source: Ambulatory Visit | Attending: Radiation Oncology | Admitting: Radiation Oncology

## 2021-03-12 DIAGNOSIS — C50911 Malignant neoplasm of unspecified site of right female breast: Secondary | ICD-10-CM | POA: Diagnosis not present

## 2021-03-12 DIAGNOSIS — Z170 Estrogen receptor positive status [ER+]: Secondary | ICD-10-CM | POA: Diagnosis not present

## 2021-03-12 DIAGNOSIS — C773 Secondary and unspecified malignant neoplasm of axilla and upper limb lymph nodes: Secondary | ICD-10-CM | POA: Diagnosis not present

## 2021-03-12 DIAGNOSIS — Z510 Encounter for antineoplastic radiation therapy: Secondary | ICD-10-CM | POA: Diagnosis not present

## 2021-03-13 ENCOUNTER — Other Ambulatory Visit: Payer: Self-pay

## 2021-03-13 ENCOUNTER — Ambulatory Visit
Admission: RE | Admit: 2021-03-13 | Discharge: 2021-03-13 | Disposition: A | Discharge status: Home / Self Care | Payer: BC Managed Care – PPO | Source: Ambulatory Visit | Attending: Radiation Oncology | Admitting: Radiation Oncology

## 2021-03-13 ENCOUNTER — Inpatient Hospital Stay: Payer: BC Managed Care – PPO

## 2021-03-13 DIAGNOSIS — Z510 Encounter for antineoplastic radiation therapy: Secondary | ICD-10-CM | POA: Diagnosis not present

## 2021-03-13 DIAGNOSIS — Z87891 Personal history of nicotine dependence: Secondary | ICD-10-CM | POA: Diagnosis not present

## 2021-03-13 DIAGNOSIS — Z170 Estrogen receptor positive status [ER+]: Secondary | ICD-10-CM | POA: Diagnosis not present

## 2021-03-13 DIAGNOSIS — C50911 Malignant neoplasm of unspecified site of right female breast: Secondary | ICD-10-CM

## 2021-03-13 DIAGNOSIS — C773 Secondary and unspecified malignant neoplasm of axilla and upper limb lymph nodes: Secondary | ICD-10-CM | POA: Diagnosis not present

## 2021-03-13 LAB — CBC
HCT: 36.8 % (ref 36.0–46.0)
Hemoglobin: 12.8 g/dL (ref 12.0–15.0)
MCH: 33 pg (ref 26.0–34.0)
MCHC: 34.8 g/dL (ref 30.0–36.0)
MCV: 94.8 fL (ref 80.0–100.0)
Platelets: 123 10*3/uL — ABNORMAL LOW (ref 150–400)
RBC: 3.88 MIL/uL (ref 3.87–5.11)
RDW: 12.3 % (ref 11.5–15.5)
WBC: 5.2 10*3/uL (ref 4.0–10.5)
nRBC: 0 % (ref 0.0–0.2)

## 2021-03-14 ENCOUNTER — Ambulatory Visit
Admission: RE | Admit: 2021-03-14 | Discharge: 2021-03-14 | Disposition: A | Discharge status: Home / Self Care | Payer: BC Managed Care – PPO | Source: Ambulatory Visit | Attending: Radiation Oncology | Admitting: Radiation Oncology

## 2021-03-14 DIAGNOSIS — Z510 Encounter for antineoplastic radiation therapy: Secondary | ICD-10-CM | POA: Diagnosis not present

## 2021-03-14 DIAGNOSIS — C773 Secondary and unspecified malignant neoplasm of axilla and upper limb lymph nodes: Secondary | ICD-10-CM | POA: Diagnosis not present

## 2021-03-14 DIAGNOSIS — C50911 Malignant neoplasm of unspecified site of right female breast: Secondary | ICD-10-CM | POA: Diagnosis not present

## 2021-03-14 DIAGNOSIS — Z170 Estrogen receptor positive status [ER+]: Secondary | ICD-10-CM | POA: Diagnosis not present

## 2021-03-15 ENCOUNTER — Telehealth: Payer: Self-pay | Admitting: Radiation Oncology

## 2021-03-15 ENCOUNTER — Ambulatory Visit
Admission: RE | Admit: 2021-03-15 | Discharge: 2021-03-15 | Disposition: A | Discharge status: Home / Self Care | Payer: BC Managed Care – PPO | Source: Ambulatory Visit | Attending: Radiation Oncology | Admitting: Radiation Oncology

## 2021-03-15 DIAGNOSIS — C50911 Malignant neoplasm of unspecified site of right female breast: Secondary | ICD-10-CM | POA: Diagnosis not present

## 2021-03-15 DIAGNOSIS — Z170 Estrogen receptor positive status [ER+]: Secondary | ICD-10-CM | POA: Diagnosis not present

## 2021-03-15 DIAGNOSIS — C773 Secondary and unspecified malignant neoplasm of axilla and upper limb lymph nodes: Secondary | ICD-10-CM | POA: Diagnosis not present

## 2021-03-15 DIAGNOSIS — Z510 Encounter for antineoplastic radiation therapy: Secondary | ICD-10-CM | POA: Diagnosis not present

## 2021-03-15 NOTE — Telephone Encounter (Signed)
Patient left VM on answering service requesting a return phone call about her radiation appointments.

## 2021-03-18 ENCOUNTER — Ambulatory Visit
Admission: RE | Admit: 2021-03-18 | Discharge: 2021-03-18 | Disposition: A | Discharge status: Home / Self Care | Payer: BC Managed Care – PPO | Source: Ambulatory Visit | Attending: Radiation Oncology | Admitting: Radiation Oncology

## 2021-03-18 DIAGNOSIS — C773 Secondary and unspecified malignant neoplasm of axilla and upper limb lymph nodes: Secondary | ICD-10-CM | POA: Diagnosis not present

## 2021-03-18 DIAGNOSIS — Z170 Estrogen receptor positive status [ER+]: Secondary | ICD-10-CM | POA: Insufficient documentation

## 2021-03-18 DIAGNOSIS — Z510 Encounter for antineoplastic radiation therapy: Secondary | ICD-10-CM | POA: Diagnosis not present

## 2021-03-18 DIAGNOSIS — C50911 Malignant neoplasm of unspecified site of right female breast: Secondary | ICD-10-CM | POA: Insufficient documentation

## 2021-03-19 ENCOUNTER — Ambulatory Visit
Admission: RE | Admit: 2021-03-19 | Discharge: 2021-03-19 | Disposition: A | Discharge status: Home / Self Care | Payer: BC Managed Care – PPO | Source: Ambulatory Visit | Attending: Radiation Oncology | Admitting: Radiation Oncology

## 2021-03-19 DIAGNOSIS — C50911 Malignant neoplasm of unspecified site of right female breast: Secondary | ICD-10-CM | POA: Diagnosis not present

## 2021-03-19 DIAGNOSIS — C773 Secondary and unspecified malignant neoplasm of axilla and upper limb lymph nodes: Secondary | ICD-10-CM | POA: Diagnosis not present

## 2021-03-19 DIAGNOSIS — Z170 Estrogen receptor positive status [ER+]: Secondary | ICD-10-CM | POA: Diagnosis not present

## 2021-03-19 DIAGNOSIS — Z510 Encounter for antineoplastic radiation therapy: Secondary | ICD-10-CM | POA: Diagnosis not present

## 2021-03-20 ENCOUNTER — Ambulatory Visit
Admission: RE | Admit: 2021-03-20 | Discharge: 2021-03-20 | Disposition: A | Discharge status: Home / Self Care | Payer: BC Managed Care – PPO | Source: Ambulatory Visit | Attending: Radiation Oncology | Admitting: Radiation Oncology

## 2021-03-20 DIAGNOSIS — Z510 Encounter for antineoplastic radiation therapy: Secondary | ICD-10-CM | POA: Diagnosis not present

## 2021-03-20 DIAGNOSIS — Z170 Estrogen receptor positive status [ER+]: Secondary | ICD-10-CM | POA: Diagnosis not present

## 2021-03-20 DIAGNOSIS — C773 Secondary and unspecified malignant neoplasm of axilla and upper limb lymph nodes: Secondary | ICD-10-CM | POA: Diagnosis not present

## 2021-03-20 DIAGNOSIS — C50911 Malignant neoplasm of unspecified site of right female breast: Secondary | ICD-10-CM | POA: Diagnosis not present

## 2021-03-21 ENCOUNTER — Ambulatory Visit
Admission: RE | Admit: 2021-03-21 | Discharge: 2021-03-21 | Disposition: A | Discharge status: Home / Self Care | Payer: BC Managed Care – PPO | Source: Ambulatory Visit | Attending: Radiation Oncology | Admitting: Radiation Oncology

## 2021-03-21 DIAGNOSIS — Z510 Encounter for antineoplastic radiation therapy: Secondary | ICD-10-CM | POA: Diagnosis not present

## 2021-03-21 DIAGNOSIS — C773 Secondary and unspecified malignant neoplasm of axilla and upper limb lymph nodes: Secondary | ICD-10-CM | POA: Diagnosis not present

## 2021-03-21 DIAGNOSIS — C50911 Malignant neoplasm of unspecified site of right female breast: Secondary | ICD-10-CM | POA: Diagnosis not present

## 2021-03-21 DIAGNOSIS — Z170 Estrogen receptor positive status [ER+]: Secondary | ICD-10-CM | POA: Diagnosis not present

## 2021-03-22 ENCOUNTER — Ambulatory Visit
Admission: RE | Admit: 2021-03-22 | Discharge: 2021-03-22 | Disposition: A | Discharge status: Home / Self Care | Payer: BC Managed Care – PPO | Source: Ambulatory Visit | Attending: Radiation Oncology | Admitting: Radiation Oncology

## 2021-03-22 DIAGNOSIS — C773 Secondary and unspecified malignant neoplasm of axilla and upper limb lymph nodes: Secondary | ICD-10-CM | POA: Diagnosis not present

## 2021-03-22 DIAGNOSIS — Z170 Estrogen receptor positive status [ER+]: Secondary | ICD-10-CM | POA: Diagnosis not present

## 2021-03-22 DIAGNOSIS — C50911 Malignant neoplasm of unspecified site of right female breast: Secondary | ICD-10-CM | POA: Diagnosis not present

## 2021-03-22 DIAGNOSIS — Z510 Encounter for antineoplastic radiation therapy: Secondary | ICD-10-CM | POA: Diagnosis not present

## 2021-03-25 ENCOUNTER — Ambulatory Visit
Admission: RE | Admit: 2021-03-25 | Discharge: 2021-03-25 | Disposition: A | Discharge status: Home / Self Care | Payer: BC Managed Care – PPO | Source: Ambulatory Visit | Attending: Radiation Oncology | Admitting: Radiation Oncology

## 2021-03-25 DIAGNOSIS — C50911 Malignant neoplasm of unspecified site of right female breast: Secondary | ICD-10-CM | POA: Diagnosis not present

## 2021-03-25 DIAGNOSIS — C773 Secondary and unspecified malignant neoplasm of axilla and upper limb lymph nodes: Secondary | ICD-10-CM | POA: Diagnosis not present

## 2021-03-25 DIAGNOSIS — Z170 Estrogen receptor positive status [ER+]: Secondary | ICD-10-CM | POA: Diagnosis not present

## 2021-03-25 DIAGNOSIS — Z510 Encounter for antineoplastic radiation therapy: Secondary | ICD-10-CM | POA: Diagnosis not present

## 2021-03-26 ENCOUNTER — Ambulatory Visit
Admission: RE | Admit: 2021-03-26 | Discharge: 2021-03-26 | Disposition: A | Discharge status: Home / Self Care | Payer: BC Managed Care – PPO | Source: Ambulatory Visit | Attending: Radiation Oncology | Admitting: Radiation Oncology

## 2021-03-26 ENCOUNTER — Other Ambulatory Visit: Payer: Self-pay

## 2021-03-26 ENCOUNTER — Inpatient Hospital Stay: Payer: BC Managed Care – PPO | Attending: Radiation Oncology

## 2021-03-26 DIAGNOSIS — Z170 Estrogen receptor positive status [ER+]: Secondary | ICD-10-CM | POA: Insufficient documentation

## 2021-03-26 DIAGNOSIS — Z510 Encounter for antineoplastic radiation therapy: Secondary | ICD-10-CM | POA: Diagnosis not present

## 2021-03-26 DIAGNOSIS — C50911 Malignant neoplasm of unspecified site of right female breast: Secondary | ICD-10-CM | POA: Insufficient documentation

## 2021-03-26 DIAGNOSIS — C773 Secondary and unspecified malignant neoplasm of axilla and upper limb lymph nodes: Secondary | ICD-10-CM | POA: Diagnosis not present

## 2021-03-26 LAB — CBC
HCT: 38.1 % (ref 36.0–46.0)
Hemoglobin: 13.4 g/dL (ref 12.0–15.0)
MCH: 32.6 pg (ref 26.0–34.0)
MCHC: 35.2 g/dL (ref 30.0–36.0)
MCV: 92.7 fL (ref 80.0–100.0)
Platelets: 132 10*3/uL — ABNORMAL LOW (ref 150–400)
RBC: 4.11 MIL/uL (ref 3.87–5.11)
RDW: 12.3 % (ref 11.5–15.5)
WBC: 6.5 10*3/uL (ref 4.0–10.5)
nRBC: 0 % (ref 0.0–0.2)

## 2021-03-27 ENCOUNTER — Ambulatory Visit: Payer: BC Managed Care – PPO

## 2021-03-27 ENCOUNTER — Inpatient Hospital Stay: Payer: BC Managed Care – PPO

## 2021-03-28 ENCOUNTER — Ambulatory Visit
Admission: RE | Admit: 2021-03-28 | Discharge: 2021-03-28 | Disposition: A | Discharge status: Home / Self Care | Payer: BC Managed Care – PPO | Source: Ambulatory Visit | Attending: Radiation Oncology | Admitting: Radiation Oncology

## 2021-03-28 DIAGNOSIS — Z510 Encounter for antineoplastic radiation therapy: Secondary | ICD-10-CM | POA: Diagnosis not present

## 2021-03-28 DIAGNOSIS — C50911 Malignant neoplasm of unspecified site of right female breast: Secondary | ICD-10-CM | POA: Diagnosis not present

## 2021-03-28 DIAGNOSIS — Z170 Estrogen receptor positive status [ER+]: Secondary | ICD-10-CM | POA: Diagnosis not present

## 2021-03-28 DIAGNOSIS — C773 Secondary and unspecified malignant neoplasm of axilla and upper limb lymph nodes: Secondary | ICD-10-CM | POA: Diagnosis not present

## 2021-03-29 ENCOUNTER — Ambulatory Visit
Admission: RE | Admit: 2021-03-29 | Discharge: 2021-03-29 | Disposition: A | Discharge status: Home / Self Care | Payer: BC Managed Care – PPO | Source: Ambulatory Visit | Attending: Radiation Oncology | Admitting: Radiation Oncology

## 2021-03-29 DIAGNOSIS — Z170 Estrogen receptor positive status [ER+]: Secondary | ICD-10-CM | POA: Diagnosis not present

## 2021-03-29 DIAGNOSIS — C773 Secondary and unspecified malignant neoplasm of axilla and upper limb lymph nodes: Secondary | ICD-10-CM | POA: Diagnosis not present

## 2021-03-29 DIAGNOSIS — C50911 Malignant neoplasm of unspecified site of right female breast: Secondary | ICD-10-CM | POA: Diagnosis not present

## 2021-03-29 DIAGNOSIS — Z510 Encounter for antineoplastic radiation therapy: Secondary | ICD-10-CM | POA: Diagnosis not present

## 2021-04-01 ENCOUNTER — Ambulatory Visit
Admission: RE | Admit: 2021-04-01 | Discharge: 2021-04-01 | Disposition: A | Discharge status: Home / Self Care | Payer: BC Managed Care – PPO | Source: Ambulatory Visit | Attending: Radiation Oncology | Admitting: Radiation Oncology

## 2021-04-01 DIAGNOSIS — C50911 Malignant neoplasm of unspecified site of right female breast: Secondary | ICD-10-CM | POA: Diagnosis not present

## 2021-04-01 DIAGNOSIS — Z510 Encounter for antineoplastic radiation therapy: Secondary | ICD-10-CM | POA: Diagnosis not present

## 2021-04-01 DIAGNOSIS — Z170 Estrogen receptor positive status [ER+]: Secondary | ICD-10-CM | POA: Diagnosis not present

## 2021-04-01 DIAGNOSIS — C773 Secondary and unspecified malignant neoplasm of axilla and upper limb lymph nodes: Secondary | ICD-10-CM | POA: Diagnosis not present

## 2021-04-02 ENCOUNTER — Ambulatory Visit
Admission: RE | Admit: 2021-04-02 | Discharge: 2021-04-02 | Disposition: A | Discharge status: Home / Self Care | Payer: BC Managed Care – PPO | Source: Ambulatory Visit | Attending: Radiation Oncology | Admitting: Radiation Oncology

## 2021-04-02 ENCOUNTER — Other Ambulatory Visit: Payer: Self-pay | Admitting: Family Medicine

## 2021-04-02 DIAGNOSIS — C50911 Malignant neoplasm of unspecified site of right female breast: Secondary | ICD-10-CM | POA: Diagnosis not present

## 2021-04-02 DIAGNOSIS — G629 Polyneuropathy, unspecified: Secondary | ICD-10-CM

## 2021-04-02 DIAGNOSIS — C773 Secondary and unspecified malignant neoplasm of axilla and upper limb lymph nodes: Secondary | ICD-10-CM | POA: Diagnosis not present

## 2021-04-02 DIAGNOSIS — Z170 Estrogen receptor positive status [ER+]: Secondary | ICD-10-CM | POA: Diagnosis not present

## 2021-04-02 DIAGNOSIS — Z510 Encounter for antineoplastic radiation therapy: Secondary | ICD-10-CM | POA: Diagnosis not present

## 2021-04-03 ENCOUNTER — Ambulatory Visit
Admission: RE | Admit: 2021-04-03 | Discharge: 2021-04-03 | Disposition: A | Discharge status: Home / Self Care | Payer: BC Managed Care – PPO | Source: Ambulatory Visit | Attending: Radiation Oncology | Admitting: Radiation Oncology

## 2021-04-03 DIAGNOSIS — C50911 Malignant neoplasm of unspecified site of right female breast: Secondary | ICD-10-CM | POA: Diagnosis not present

## 2021-04-03 DIAGNOSIS — Z510 Encounter for antineoplastic radiation therapy: Secondary | ICD-10-CM | POA: Diagnosis not present

## 2021-04-03 DIAGNOSIS — C773 Secondary and unspecified malignant neoplasm of axilla and upper limb lymph nodes: Secondary | ICD-10-CM | POA: Diagnosis not present

## 2021-04-03 DIAGNOSIS — Z170 Estrogen receptor positive status [ER+]: Secondary | ICD-10-CM | POA: Diagnosis not present

## 2021-04-04 ENCOUNTER — Ambulatory Visit
Admission: RE | Admit: 2021-04-04 | Discharge: 2021-04-04 | Disposition: A | Discharge status: Home / Self Care | Payer: BC Managed Care – PPO | Source: Ambulatory Visit | Attending: Radiation Oncology | Admitting: Radiation Oncology

## 2021-04-04 DIAGNOSIS — C50911 Malignant neoplasm of unspecified site of right female breast: Secondary | ICD-10-CM | POA: Diagnosis not present

## 2021-04-04 DIAGNOSIS — C773 Secondary and unspecified malignant neoplasm of axilla and upper limb lymph nodes: Secondary | ICD-10-CM | POA: Diagnosis not present

## 2021-04-04 DIAGNOSIS — Z510 Encounter for antineoplastic radiation therapy: Secondary | ICD-10-CM | POA: Diagnosis not present

## 2021-04-04 DIAGNOSIS — Z170 Estrogen receptor positive status [ER+]: Secondary | ICD-10-CM | POA: Diagnosis not present

## 2021-04-05 ENCOUNTER — Ambulatory Visit
Admission: RE | Admit: 2021-04-05 | Discharge: 2021-04-05 | Disposition: A | Discharge status: Home / Self Care | Payer: BC Managed Care – PPO | Source: Ambulatory Visit | Attending: Radiation Oncology | Admitting: Radiation Oncology

## 2021-04-05 DIAGNOSIS — Z510 Encounter for antineoplastic radiation therapy: Secondary | ICD-10-CM | POA: Diagnosis not present

## 2021-04-05 DIAGNOSIS — C773 Secondary and unspecified malignant neoplasm of axilla and upper limb lymph nodes: Secondary | ICD-10-CM | POA: Diagnosis not present

## 2021-04-05 DIAGNOSIS — C50911 Malignant neoplasm of unspecified site of right female breast: Secondary | ICD-10-CM | POA: Diagnosis not present

## 2021-04-05 DIAGNOSIS — Z170 Estrogen receptor positive status [ER+]: Secondary | ICD-10-CM | POA: Diagnosis not present

## 2021-04-07 DIAGNOSIS — C50911 Malignant neoplasm of unspecified site of right female breast: Secondary | ICD-10-CM | POA: Diagnosis not present

## 2021-04-07 DIAGNOSIS — Z510 Encounter for antineoplastic radiation therapy: Secondary | ICD-10-CM | POA: Diagnosis not present

## 2021-04-07 DIAGNOSIS — Z170 Estrogen receptor positive status [ER+]: Secondary | ICD-10-CM | POA: Diagnosis not present

## 2021-04-07 DIAGNOSIS — C773 Secondary and unspecified malignant neoplasm of axilla and upper limb lymph nodes: Secondary | ICD-10-CM | POA: Diagnosis not present

## 2021-04-08 ENCOUNTER — Ambulatory Visit
Admission: RE | Admit: 2021-04-08 | Discharge: 2021-04-08 | Disposition: A | Discharge status: Home / Self Care | Payer: BC Managed Care – PPO | Source: Ambulatory Visit | Attending: Radiation Oncology | Admitting: Radiation Oncology

## 2021-04-08 DIAGNOSIS — Z510 Encounter for antineoplastic radiation therapy: Secondary | ICD-10-CM | POA: Diagnosis not present

## 2021-04-08 DIAGNOSIS — Z170 Estrogen receptor positive status [ER+]: Secondary | ICD-10-CM | POA: Diagnosis not present

## 2021-04-08 DIAGNOSIS — C50911 Malignant neoplasm of unspecified site of right female breast: Secondary | ICD-10-CM | POA: Diagnosis not present

## 2021-04-08 DIAGNOSIS — C773 Secondary and unspecified malignant neoplasm of axilla and upper limb lymph nodes: Secondary | ICD-10-CM | POA: Diagnosis not present

## 2021-04-09 ENCOUNTER — Ambulatory Visit: Payer: BC Managed Care – PPO

## 2021-04-09 ENCOUNTER — Ambulatory Visit
Admission: RE | Admit: 2021-04-09 | Discharge: 2021-04-09 | Disposition: A | Discharge status: Home / Self Care | Payer: BC Managed Care – PPO | Source: Ambulatory Visit | Attending: Radiation Oncology | Admitting: Radiation Oncology

## 2021-04-09 DIAGNOSIS — C50911 Malignant neoplasm of unspecified site of right female breast: Secondary | ICD-10-CM | POA: Diagnosis not present

## 2021-04-09 DIAGNOSIS — C773 Secondary and unspecified malignant neoplasm of axilla and upper limb lymph nodes: Secondary | ICD-10-CM | POA: Diagnosis not present

## 2021-04-09 DIAGNOSIS — Z170 Estrogen receptor positive status [ER+]: Secondary | ICD-10-CM | POA: Diagnosis not present

## 2021-04-09 DIAGNOSIS — Z510 Encounter for antineoplastic radiation therapy: Secondary | ICD-10-CM | POA: Diagnosis not present

## 2021-04-10 ENCOUNTER — Ambulatory Visit
Admission: RE | Admit: 2021-04-10 | Discharge: 2021-04-10 | Disposition: A | Discharge status: Home / Self Care | Payer: BC Managed Care – PPO | Source: Ambulatory Visit | Attending: Radiation Oncology | Admitting: Radiation Oncology

## 2021-04-10 ENCOUNTER — Inpatient Hospital Stay: Payer: BC Managed Care – PPO

## 2021-04-10 ENCOUNTER — Ambulatory Visit: Payer: BC Managed Care – PPO

## 2021-04-10 DIAGNOSIS — C50911 Malignant neoplasm of unspecified site of right female breast: Secondary | ICD-10-CM | POA: Diagnosis not present

## 2021-04-10 DIAGNOSIS — Z170 Estrogen receptor positive status [ER+]: Secondary | ICD-10-CM

## 2021-04-10 DIAGNOSIS — Z510 Encounter for antineoplastic radiation therapy: Secondary | ICD-10-CM | POA: Diagnosis not present

## 2021-04-10 DIAGNOSIS — C773 Secondary and unspecified malignant neoplasm of axilla and upper limb lymph nodes: Secondary | ICD-10-CM | POA: Diagnosis not present

## 2021-04-10 LAB — CBC
HCT: 36.4 % (ref 36.0–46.0)
Hemoglobin: 12.7 g/dL (ref 12.0–15.0)
MCH: 32.6 pg (ref 26.0–34.0)
MCHC: 34.9 g/dL (ref 30.0–36.0)
MCV: 93.3 fL (ref 80.0–100.0)
Platelets: 115 10*3/uL — ABNORMAL LOW (ref 150–400)
RBC: 3.9 MIL/uL (ref 3.87–5.11)
RDW: 12.8 % (ref 11.5–15.5)
WBC: 4.6 10*3/uL (ref 4.0–10.5)
nRBC: 0 % (ref 0.0–0.2)

## 2021-04-11 ENCOUNTER — Ambulatory Visit
Admission: RE | Admit: 2021-04-11 | Discharge: 2021-04-11 | Disposition: A | Discharge status: Home / Self Care | Payer: BC Managed Care – PPO | Source: Ambulatory Visit | Attending: Radiation Oncology | Admitting: Radiation Oncology

## 2021-04-11 ENCOUNTER — Ambulatory Visit: Payer: BC Managed Care – PPO

## 2021-04-11 DIAGNOSIS — Z170 Estrogen receptor positive status [ER+]: Secondary | ICD-10-CM | POA: Diagnosis not present

## 2021-04-11 DIAGNOSIS — Z510 Encounter for antineoplastic radiation therapy: Secondary | ICD-10-CM | POA: Diagnosis not present

## 2021-04-11 DIAGNOSIS — C50911 Malignant neoplasm of unspecified site of right female breast: Secondary | ICD-10-CM | POA: Diagnosis not present

## 2021-04-11 DIAGNOSIS — C773 Secondary and unspecified malignant neoplasm of axilla and upper limb lymph nodes: Secondary | ICD-10-CM | POA: Diagnosis not present

## 2021-04-12 ENCOUNTER — Ambulatory Visit
Admission: RE | Admit: 2021-04-12 | Discharge: 2021-04-12 | Disposition: A | Discharge status: Home / Self Care | Payer: BC Managed Care – PPO | Source: Ambulatory Visit | Attending: Radiation Oncology | Admitting: Radiation Oncology

## 2021-04-12 DIAGNOSIS — C50911 Malignant neoplasm of unspecified site of right female breast: Secondary | ICD-10-CM | POA: Diagnosis not present

## 2021-04-12 DIAGNOSIS — Z170 Estrogen receptor positive status [ER+]: Secondary | ICD-10-CM | POA: Diagnosis not present

## 2021-04-12 DIAGNOSIS — Z510 Encounter for antineoplastic radiation therapy: Secondary | ICD-10-CM | POA: Diagnosis not present

## 2021-04-12 DIAGNOSIS — C773 Secondary and unspecified malignant neoplasm of axilla and upper limb lymph nodes: Secondary | ICD-10-CM | POA: Diagnosis not present

## 2021-04-15 ENCOUNTER — Ambulatory Visit
Admission: RE | Admit: 2021-04-15 | Discharge: 2021-04-15 | Disposition: A | Discharge status: Home / Self Care | Payer: BC Managed Care – PPO | Source: Ambulatory Visit | Attending: Radiation Oncology | Admitting: Radiation Oncology

## 2021-04-15 DIAGNOSIS — C50911 Malignant neoplasm of unspecified site of right female breast: Secondary | ICD-10-CM | POA: Diagnosis not present

## 2021-04-15 DIAGNOSIS — Z510 Encounter for antineoplastic radiation therapy: Secondary | ICD-10-CM | POA: Diagnosis not present

## 2021-04-15 DIAGNOSIS — Z170 Estrogen receptor positive status [ER+]: Secondary | ICD-10-CM | POA: Diagnosis not present

## 2021-04-15 DIAGNOSIS — C773 Secondary and unspecified malignant neoplasm of axilla and upper limb lymph nodes: Secondary | ICD-10-CM | POA: Diagnosis not present

## 2021-04-16 ENCOUNTER — Ambulatory Visit
Admission: RE | Admit: 2021-04-16 | Discharge: 2021-04-16 | Disposition: A | Discharge status: Home / Self Care | Payer: BC Managed Care – PPO | Source: Ambulatory Visit | Attending: Radiation Oncology | Admitting: Radiation Oncology

## 2021-04-16 ENCOUNTER — Other Ambulatory Visit: Payer: BC Managed Care – PPO

## 2021-04-16 ENCOUNTER — Ambulatory Visit: Payer: BC Managed Care – PPO | Admitting: Oncology

## 2021-04-16 DIAGNOSIS — Z170 Estrogen receptor positive status [ER+]: Secondary | ICD-10-CM | POA: Diagnosis not present

## 2021-04-16 DIAGNOSIS — C773 Secondary and unspecified malignant neoplasm of axilla and upper limb lymph nodes: Secondary | ICD-10-CM | POA: Diagnosis not present

## 2021-04-16 DIAGNOSIS — Z510 Encounter for antineoplastic radiation therapy: Secondary | ICD-10-CM | POA: Diagnosis not present

## 2021-04-16 DIAGNOSIS — C50911 Malignant neoplasm of unspecified site of right female breast: Secondary | ICD-10-CM | POA: Diagnosis not present

## 2021-04-17 ENCOUNTER — Ambulatory Visit
Admission: RE | Admit: 2021-04-17 | Discharge: 2021-04-17 | Disposition: A | Discharge status: Home / Self Care | Payer: BC Managed Care – PPO | Source: Ambulatory Visit | Attending: Radiation Oncology | Admitting: Radiation Oncology

## 2021-04-17 DIAGNOSIS — Z510 Encounter for antineoplastic radiation therapy: Secondary | ICD-10-CM | POA: Diagnosis not present

## 2021-04-17 DIAGNOSIS — C50911 Malignant neoplasm of unspecified site of right female breast: Secondary | ICD-10-CM | POA: Diagnosis not present

## 2021-04-17 DIAGNOSIS — C773 Secondary and unspecified malignant neoplasm of axilla and upper limb lymph nodes: Secondary | ICD-10-CM | POA: Diagnosis not present

## 2021-04-17 DIAGNOSIS — Z170 Estrogen receptor positive status [ER+]: Secondary | ICD-10-CM | POA: Diagnosis not present

## 2021-04-18 ENCOUNTER — Other Ambulatory Visit: Payer: Self-pay | Admitting: Family Medicine

## 2021-04-18 ENCOUNTER — Ambulatory Visit
Admission: RE | Admit: 2021-04-18 | Discharge: 2021-04-18 | Disposition: A | Discharge status: Home / Self Care | Payer: BC Managed Care – PPO | Source: Ambulatory Visit | Attending: Radiation Oncology | Admitting: Radiation Oncology

## 2021-04-18 ENCOUNTER — Ambulatory Visit: Payer: BC Managed Care – PPO

## 2021-04-18 DIAGNOSIS — C773 Secondary and unspecified malignant neoplasm of axilla and upper limb lymph nodes: Secondary | ICD-10-CM | POA: Insufficient documentation

## 2021-04-18 DIAGNOSIS — Z170 Estrogen receptor positive status [ER+]: Secondary | ICD-10-CM | POA: Insufficient documentation

## 2021-04-18 DIAGNOSIS — Z510 Encounter for antineoplastic radiation therapy: Secondary | ICD-10-CM | POA: Insufficient documentation

## 2021-04-18 DIAGNOSIS — C50911 Malignant neoplasm of unspecified site of right female breast: Secondary | ICD-10-CM | POA: Diagnosis not present

## 2021-04-18 DIAGNOSIS — Z23 Encounter for immunization: Secondary | ICD-10-CM

## 2021-04-18 MED ORDER — ALPRAZOLAM 0.5 MG PO TABS: ORAL_TABLET | ORAL | 0 refills | Status: DC

## 2021-04-18 NOTE — Addendum Note (Signed)
Addended by: Loreen Freud on: 04/18/2021 05:06 PM   Modules accepted: Orders

## 2021-04-18 NOTE — Telephone Encounter (Signed)
Last Fill or Written Date and Quantity: 01/30/21 #10 Last Office Visit and Type: 12/21/20 for anxiety Next Office Visit and Type: 05/14/21 for anxiety f/u

## 2021-04-18 NOTE — Telephone Encounter (Signed)
Pt needs a refill on ALPRAZolam (XANAX) 0.5 MG tablet sent to [facility]  She said that she takes this as needed

## 2021-04-19 ENCOUNTER — Ambulatory Visit: Payer: BC Managed Care – PPO

## 2021-04-19 ENCOUNTER — Ambulatory Visit
Admission: RE | Admit: 2021-04-19 | Discharge: 2021-04-19 | Disposition: A | Discharge status: Home / Self Care | Payer: BC Managed Care – PPO | Source: Ambulatory Visit | Attending: Radiation Oncology | Admitting: Radiation Oncology

## 2021-04-19 DIAGNOSIS — Z510 Encounter for antineoplastic radiation therapy: Secondary | ICD-10-CM | POA: Diagnosis not present

## 2021-04-19 DIAGNOSIS — C50911 Malignant neoplasm of unspecified site of right female breast: Secondary | ICD-10-CM | POA: Diagnosis not present

## 2021-04-19 DIAGNOSIS — Z170 Estrogen receptor positive status [ER+]: Secondary | ICD-10-CM | POA: Diagnosis not present

## 2021-04-19 DIAGNOSIS — C773 Secondary and unspecified malignant neoplasm of axilla and upper limb lymph nodes: Secondary | ICD-10-CM | POA: Diagnosis not present

## 2021-04-23 ENCOUNTER — Ambulatory Visit
Admission: RE | Admit: 2021-04-23 | Discharge: 2021-04-23 | Disposition: A | Discharge status: Home / Self Care | Payer: BC Managed Care – PPO | Source: Ambulatory Visit | Attending: Radiation Oncology | Admitting: Radiation Oncology

## 2021-04-23 DIAGNOSIS — C773 Secondary and unspecified malignant neoplasm of axilla and upper limb lymph nodes: Secondary | ICD-10-CM | POA: Diagnosis not present

## 2021-04-23 DIAGNOSIS — Z510 Encounter for antineoplastic radiation therapy: Secondary | ICD-10-CM | POA: Diagnosis not present

## 2021-04-23 DIAGNOSIS — C50911 Malignant neoplasm of unspecified site of right female breast: Secondary | ICD-10-CM | POA: Diagnosis not present

## 2021-04-23 DIAGNOSIS — Z170 Estrogen receptor positive status [ER+]: Secondary | ICD-10-CM | POA: Diagnosis not present

## 2021-05-01 ENCOUNTER — Other Ambulatory Visit: Payer: Self-pay

## 2021-05-01 ENCOUNTER — Ambulatory Visit
Admission: RE | Admit: 2021-05-01 | Discharge: 2021-05-01 | Disposition: A | Discharge status: Home / Self Care | Payer: BC Managed Care – PPO | Source: Ambulatory Visit | Attending: Oncology | Admitting: Oncology

## 2021-05-01 DIAGNOSIS — C50911 Malignant neoplasm of unspecified site of right female breast: Secondary | ICD-10-CM | POA: Diagnosis not present

## 2021-05-01 DIAGNOSIS — Z170 Estrogen receptor positive status [ER+]: Secondary | ICD-10-CM | POA: Insufficient documentation

## 2021-05-01 DIAGNOSIS — Z780 Asymptomatic menopausal state: Secondary | ICD-10-CM | POA: Insufficient documentation

## 2021-05-14 ENCOUNTER — Other Ambulatory Visit: Payer: Self-pay | Admitting: *Deleted

## 2021-05-14 ENCOUNTER — Encounter: Payer: Self-pay | Admitting: Nurse Practitioner

## 2021-05-14 ENCOUNTER — Inpatient Hospital Stay: Payer: BC Managed Care – PPO | Attending: Oncology

## 2021-05-14 ENCOUNTER — Ambulatory Visit: Payer: BC Managed Care – PPO | Admitting: Family Medicine

## 2021-05-14 ENCOUNTER — Inpatient Hospital Stay (HOSPITAL_BASED_OUTPATIENT_CLINIC_OR_DEPARTMENT_OTHER): Payer: BC Managed Care – PPO | Admitting: Nurse Practitioner

## 2021-05-14 VITALS — BP 156/75 | HR 57 | Temp 97.9°F | Resp 18 | Wt 194.0 lb

## 2021-05-14 DIAGNOSIS — Z923 Personal history of irradiation: Secondary | ICD-10-CM | POA: Diagnosis not present

## 2021-05-14 DIAGNOSIS — Z853 Personal history of malignant neoplasm of breast: Secondary | ICD-10-CM | POA: Diagnosis not present

## 2021-05-14 DIAGNOSIS — C50911 Malignant neoplasm of unspecified site of right female breast: Secondary | ICD-10-CM

## 2021-05-14 DIAGNOSIS — Z170 Estrogen receptor positive status [ER+]: Secondary | ICD-10-CM | POA: Insufficient documentation

## 2021-05-14 DIAGNOSIS — Z79811 Long term (current) use of aromatase inhibitors: Secondary | ICD-10-CM | POA: Diagnosis not present

## 2021-05-14 DIAGNOSIS — Z08 Encounter for follow-up examination after completed treatment for malignant neoplasm: Secondary | ICD-10-CM | POA: Diagnosis not present

## 2021-05-14 LAB — COMPREHENSIVE METABOLIC PANEL
ALT: 23 U/L (ref 0–44)
AST: 24 U/L (ref 15–41)
Albumin: 4.3 g/dL (ref 3.5–5.0)
Alkaline Phosphatase: 70 U/L (ref 38–126)
Anion gap: 8 (ref 5–15)
BUN: 19 mg/dL (ref 6–20)
CO2: 24 mmol/L (ref 22–32)
Calcium: 9.1 mg/dL (ref 8.9–10.3)
Chloride: 105 mmol/L (ref 98–111)
Creatinine, Ser: 0.91 mg/dL (ref 0.44–1.00)
GFR, Estimated: >60 mL/min (ref >60)
Glucose, Bld: 115 mg/dL — ABNORMAL HIGH (ref 70–99)
Potassium: 4.4 mmol/L (ref 3.5–5.1)
Sodium: 137 mmol/L (ref 135–145)
Total Bilirubin: 0.7 mg/dL (ref 0.3–1.2)
Total Protein: 7.5 g/dL (ref 6.5–8.1)

## 2021-05-14 LAB — CBC WITH DIFFERENTIAL/PLATELET
Abs Immature Granulocytes: 0.02 K/uL (ref 0.00–0.07)
Basophils Absolute: 0 K/uL (ref 0.0–0.1)
Basophils Relative: 1 %
Eosinophils Absolute: 0.1 K/uL (ref 0.0–0.5)
Eosinophils Relative: 3 %
HCT: 36.9 % (ref 36.0–46.0)
Hemoglobin: 12.7 g/dL (ref 12.0–15.0)
Immature Granulocytes: 0 %
Lymphocytes Relative: 26 %
Lymphs Abs: 1.2 K/uL (ref 0.7–4.0)
MCH: 32.7 pg (ref 26.0–34.0)
MCHC: 34.4 g/dL (ref 30.0–36.0)
MCV: 95.1 fL (ref 80.0–100.0)
Monocytes Absolute: 0.3 K/uL (ref 0.1–1.0)
Monocytes Relative: 7 %
Neutro Abs: 2.8 K/uL (ref 1.7–7.7)
Neutrophils Relative %: 63 %
Platelets: 117 K/uL — ABNORMAL LOW (ref 150–400)
RBC: 3.88 MIL/uL (ref 3.87–5.11)
RDW: 12.9 % (ref 11.5–15.5)
WBC: 4.5 K/uL (ref 4.0–10.5)
nRBC: 0 % (ref 0.0–0.2)

## 2021-05-14 NOTE — Progress Notes (Signed)
Hematology/Oncology Consult Note [facility]  Telephone:([phone] [phone]  Patient Care Team: Lesleigh Noe, MD as PCP - General (Family Medicine) Reeves Forth (Gastroenterology) Sharlet Salina, MD as Referring Physician (Physical Medicine and Rehabilitation) Maximiano Coss, Loraine Grip, MD (Neurology) Theodore Demark, RN as Oncology Nurse Navigator   Name of the patient: Morgan Sanders  [id number]  02/05/1976   Date of visit: 05/14/21  Diagnosis- pathological prognostic stage Ia invasive mammary carcinoma of the right breast and pT1b pN1 acM0 ER/PR positive HER2 negative  Chief complaint/ Reason for visit- discuss hormone therapy for breast cancer  Heme/Onc history: Patient is a 45 year old post menopausal female with a prior history of normocytic anemia for which she had a complete work-up including bone marrow biopsy as well as history of cirrhosis of the liver.  She is postmenopausal and she has not had any menstrual cycles since the age of 49.  Patient underwent a bilateral screening mammogram on 12/21/2020 which showed possible mass in the right breast.  This was followed by diagnostic mammogram and ultrasound which showed 2 masses in the right breast at 12 o'clock position each measuring 6 mm and 1.2 cm apart.  Ultrasound of the right axilla was negative for malignancy.  Patient underwent biopsy of both the medial and the lateral breast mass and it was consistent with invasive mammary carcinoma grade 2.  ER greater than 90% positive, PR greater than 90% positive and HER2 negative   Patient underwent right lumpectomy with sentinel lymph node biopsy Final pathology showed 2 tumors 7 mm in size both of which were grade 1.  The tissue in between did not have any evidence of malignancy or DCIS.  She was noted to have metastatic carcinoma in 2 out of 4 sentinel lymph node 6 mm deposit with no extracapsular extension.   MammaPrint score came back at  low risk and therefore patient did not require any adjuvant chemotherapy.  She has now started adjuvant radiation treatment  Interval history- Patient is 45 year old female who returns to clinic for labs and continued surveillance of breast cancer. She completed radiation without significant side effects.  She has not had menstrual periods for approximately one to two years but has not had hormone levels checked. She had a tubal ligation for contraception.   ECOG PS- 0 Pain scale- 0  Review of systems- Review of Systems  Constitutional:  Negative for chills, fever, malaise/fatigue and weight loss.  HENT:  Negative for congestion, ear discharge and nosebleeds.   Eyes:  Negative for blurred vision.  Respiratory:  Negative for cough, hemoptysis, sputum production, shortness of breath and wheezing.   Cardiovascular:  Negative for chest pain, palpitations, orthopnea and claudication.  Gastrointestinal:  Negative for abdominal pain, blood in stool, constipation, diarrhea, heartburn, melena, nausea and vomiting.  Genitourinary:  Negative for dysuria, flank pain, frequency, hematuria and urgency.  Musculoskeletal:  Negative for back pain, joint pain and myalgias.  Skin:  Negative for rash.  Neurological:  Negative for dizziness, tingling, focal weakness, seizures, weakness and headaches.  Endo/Heme/Allergies:  Does not bruise/bleed easily.  Psychiatric/Behavioral:  Negative for depression and suicidal ideas. The patient does not have insomnia.     No Known Allergies  Past Medical History:  Diagnosis Date   Allergy    Anemia    Asthma    Blood transfusion without reported diagnosis    Chicken pox    Colon polyps    Depression  Heart murmur    Hypertension    Neuromuscular disorder (HCC)    neuropathy   Pneumonia    Stomach ulcer    Past Surgical History:  Procedure Laterality Date   BREAST BIOPSY Right 01/01/2021   u/s bx 12:00 1 cmfn path pending x clip lateral   BREAST BIOPSY  Right 01/01/2021   u/s bx 12:00 1cmfn path pending vision medial   BREAST LUMPECTOMY WITH SENTINEL LYMPH NODE BIOPSY Right 01/18/2021   Procedure: BREAST LUMPECTOMY WITH SENTINEL LYMPH NODE BX;  Surgeon: Robert Bellow, MD;  Location: [facility] ORS;  Service: General;  Laterality: Right;   CHOLECYSTECTOMY     CHOLECYSTECTOMY, LAPAROSCOPIC  03/1996   COLONOSCOPY     ESOPHAGOGASTRODUODENOSCOPY (EGD) WITH PROPOFOL N/A 12/31/2018   Procedure: ESOPHAGOGASTRODUODENOSCOPY (EGD) WITH PROPOFOL;  Surgeon: Lucilla Lame, MD;  Location: [facility] ENDOSCOPY;  Service: Endoscopy;  Laterality: N/A;   TUBAL LIGATION     WISDOM TOOTH EXTRACTION     Social History   Socioeconomic History   Marital status: Married    Spouse name: [name]   Number of children: 2   Years of education: high school   Highest education level: 12th grade  Occupational History   Occupation: Golf/clubhouse attendent  Tobacco Use   Smoking status: Former    Packs/day: 1.00    Years: 20.00    Pack years: 20.00    Types: Cigarettes   Smokeless tobacco: Never  Vaping Use   Vaping Use: Some days   Substances: Nicotine, Flavoring  Substance and Sexual Activity   Alcohol use: Yes    Comment: a few times a year   Drug use: Never   Sexual activity: Not Currently  Other Topics Concern   Not on file  Social History Narrative   02/07/20   From: the area   Living: with husband, [name] (2000)   Work: at a golf course      Family: daughters - CJ (lives at [employer], 2 children) and Art gallery manager (still living at home, in Sports coach school)      Enjoys: golf       Exercise: golfing 3-4 times a week - alternating between cart/walking   Diet: pretty good - eggs/fruit, eats lunch and dinner      Safety   Seat belts: Yes    Guns: Yes  and secure   Safe in relationships: Yes    Social Determinants of Radio broadcast assistant Strain: Not on file  Food Insecurity: Not on file  Transportation Needs: Not on file  Physical Activity: Not on file   Stress: Not on file  Social Connections: Not on file  Intimate Partner Violence: Not on file   Family History  Problem Relation Age of Onset   Breast cancer Maternal Grandmother 78   Arthritis Maternal Grandmother    Cancer Maternal Grandmother    Diabetes Maternal Grandmother    Hearing loss Maternal Grandmother    Hypertension Maternal Grandmother    Bone cancer Maternal Great-grandmother    Hypertension Mother    Diabetes Mother    Hyperlipidemia Mother    Kidney disease Mother    Arthritis Mother    Asthma Mother    Depression Mother    Sudden death Father        hit by train   Liver disease Father        hx of liver transplant   Early death Father    Cataracts Paternal Grandfather    Lung cancer Paternal Grandfather  Hyperlipidemia Sister    Hypertension Sister    Depression Daughter    Early death Maternal Grandfather    Depression Daughter    Current Outpatient Medications:    albuterol (VENTOLIN HFA) 108 (90 Base) MCG/ACT inhaler, INHALE 2 PUFFS INTO THE LUNGS EVERY 4 (FOUR) HOURS AS NEEDED FOR SHORTNESS OF BREATH OR WHEEZING, Disp: 8.5 each, Rfl: 2   ALPRAZolam (XANAX) 0.5 MG tablet, TAKE 1 TABLET BY MOUTH AS NEEDED FOR ANXIETY., Disp: 10 tablet, Rfl: 0   Biotin w/ Vitamins C & E (HAIR/SKIN/NAILS PO), Take 1 tablet by mouth daily., Disp: , Rfl:    Cholecalciferol (VITAMIN D3) 125 MCG (5000 UT) CAPS, Take 5,000 Units by mouth daily., Disp: , Rfl:    escitalopram (LEXAPRO) 20 MG tablet, TAKE 1 TABLET BY MOUTH EVERY DAY (Patient taking differently: Take 20 mg by mouth daily.), Disp: 90 tablet, Rfl: 3   famotidine (PEPCID) 20 MG tablet, Take 20 mg by mouth 2 (two) times daily., Disp: , Rfl:    letrozole (FEMARA) 2.5 MG tablet, Take 1 tablet (2.5 mg total) by mouth daily., Disp: 30 tablet, Rfl: 3   loratadine (CLARITIN) 10 MG tablet, Take 10 mg by mouth daily., Disp: , Rfl:    MAG-G 500 (27 Mg) MG TABS, TAKE 1 TABLET (500MG) BY MOUTH EVERY DAY (Patient taking  differently: Take 500 mg by mouth daily.), Disp: 30 tablet, Rfl: 3   Potassium Chloride ER 20 MEQ TBCR, TAKE 1 TABLET BY MOUTH EVERY DAY, Disp: 90 tablet, Rfl: 1   pregabalin (LYRICA) 300 MG capsule, Take 300 mg by mouth 2 (two) times daily., Disp: , Rfl:    pyridOXINE (VITAMIN B-6) 100 MG tablet, Take 100 mg by mouth daily., Disp: , Rfl:    thiamine (VITAMIN B-1) 100 MG tablet, Take 250 mg by mouth daily. , Disp: , Rfl:    vitamin B-12 (CYANOCOBALAMIN) 500 MCG tablet, Take 2,500 mcg by mouth daily., Disp: , Rfl:   Physical exam:  Vitals:   05/14/21 1021  BP: (!) 156/75  Pulse: (!) 57  Resp: 18  Temp: 97.9 F (36.6 C)  SpO2: 100%  Weight: 194 lb (88 kg)   Physical Exam Constitutional:      General: She is not in acute distress. Cardiovascular:     Rate and Rhythm: Normal rate and regular rhythm.     Heart sounds: Normal heart sounds.  Pulmonary:     Effort: Pulmonary effort is normal.     Breath sounds: Normal breath sounds.  Abdominal:     General: Bowel sounds are normal.     Palpations: Abdomen is soft.  Skin:    General: Skin is warm and dry.  Neurological:     Mental Status: She is alert and oriented to person, place, and time.     CMP Latest Ref Rng & Units 05/14/2021  Glucose 70 - 99 mg/dL 115(H)  BUN 6 - 20 mg/dL 19  Creatinine 0.44 - 1.00 mg/dL 0.91  Sodium 135 - 145 mmol/L 137  Potassium 3.5 - 5.1 mmol/L 4.4  Chloride 98 - 111 mmol/L 105  CO2 22 - 32 mmol/L 24  Calcium 8.9 - 10.3 mg/dL 9.1  Total Protein 6.5 - 8.1 g/dL 7.5  Total Bilirubin 0.3 - 1.2 mg/dL 0.7  Alkaline Phos 38 - 126 U/L 70  AST 15 - 41 U/L 24  ALT 0 - 44 U/L 23   CBC Latest Ref Rng & Units 05/14/2021  WBC 4.0 - 10.5 K/uL 4.5  Hemoglobin 12.0 - 15.0 g/dL 12.7  Hematocrit 36.0 - 46.0 % 36.9  Platelets 150 - 400 K/uL 117(L)      Assessment and plan- Patient is a 45 y.o. female with pathological prognostic stage Ia invasive mammary carcinoma of the right breast MPT [id number] ER/PR  positive HER2 negative.  MammaPrint came back at low risk and patient did not require adjuvant chemotherapy.  She completed adjuvant radiation on 04/23/21.  Based on the ER/PR positivity of her tumor adjuvant endocrine therapy was recommended. She started letrozole after completing radiation. Tolerating well without significant side effects.  Plan to check estradiol, fsh/lh at next visit. Labs today reviewed and reassuring.  Ki-67 was performed on her surgical specimen and was 13% therefore she is not a candidate for adjuvant abemaciclib.  Her next mammogram will be in May 2022. Bone density scan was reviewed with t score of 0.6, normal. Continue calcium, vitamin d, and weight bearing exercise as tolerated.   Plan for her to see Dr. Janese Banks with labs in 3 months.    Visit Diagnosis No diagnosis found.   Beckey Rutter, DNP, AGNP-C IXL at [facility] [phone] (clinic) 05/14/2021

## 2021-05-17 ENCOUNTER — Encounter: Payer: Self-pay | Admitting: General Surgery

## 2021-05-27 ENCOUNTER — Ambulatory Visit: Payer: BC Managed Care – PPO | Admitting: Radiation Oncology

## 2021-06-03 ENCOUNTER — Telehealth: Payer: Self-pay | Admitting: *Deleted

## 2021-06-03 NOTE — Telephone Encounter (Signed)
Patient called reporting that she is having pain in her right shoulder for past week and it is not getting any better. She also reports that she has a rash on her back on the side she got radiation therapy. She is asking for something to be done for pain and rash and asking if the radiation therapy is the cause or the medicine (Letrozole). Please advise

## 2021-06-03 NOTE — Telephone Encounter (Signed)
Lauren please call her

## 2021-06-04 NOTE — Telephone Encounter (Signed)
Lauren spoke to patient yesterday and she requested patient to be scheduled for [place] this week.  Appointment was made for 06/05/21

## 2021-06-05 ENCOUNTER — Other Ambulatory Visit: Payer: Self-pay

## 2021-06-05 ENCOUNTER — Ambulatory Visit
Admission: RE | Admit: 2021-06-05 | Discharge: 2021-06-05 | Disposition: A | Discharge status: Home / Self Care | Payer: BC Managed Care – PPO | Source: Ambulatory Visit | Attending: Hospice and Palliative Medicine | Admitting: Hospice and Palliative Medicine

## 2021-06-05 ENCOUNTER — Inpatient Hospital Stay: Payer: BC Managed Care – PPO | Attending: Oncology

## 2021-06-05 ENCOUNTER — Ambulatory Visit
Admission: RE | Admit: 2021-06-05 | Discharge: 2021-06-05 | Disposition: A | Discharge status: Home / Self Care | Payer: BC Managed Care – PPO | Attending: Hospice and Palliative Medicine | Admitting: Hospice and Palliative Medicine

## 2021-06-05 ENCOUNTER — Inpatient Hospital Stay (HOSPITAL_BASED_OUTPATIENT_CLINIC_OR_DEPARTMENT_OTHER): Payer: BC Managed Care – PPO | Admitting: Hospice and Palliative Medicine

## 2021-06-05 VITALS — BP 132/84 | HR 75 | Temp 98.1°F | Resp 18 | Wt 195.0 lb

## 2021-06-05 DIAGNOSIS — C50911 Malignant neoplasm of unspecified site of right female breast: Secondary | ICD-10-CM | POA: Insufficient documentation

## 2021-06-05 DIAGNOSIS — M19011 Primary osteoarthritis, right shoulder: Secondary | ICD-10-CM | POA: Diagnosis not present

## 2021-06-05 DIAGNOSIS — Z170 Estrogen receptor positive status [ER+]: Secondary | ICD-10-CM | POA: Insufficient documentation

## 2021-06-05 DIAGNOSIS — M25511 Pain in right shoulder: Secondary | ICD-10-CM | POA: Diagnosis not present

## 2021-06-05 LAB — CBC WITH DIFFERENTIAL/PLATELET
Abs Immature Granulocytes: 0.01 10*3/uL (ref 0.00–0.07)
Basophils Absolute: 0 10*3/uL (ref 0.0–0.1)
Basophils Relative: 1 %
Eosinophils Absolute: 0.1 10*3/uL (ref 0.0–0.5)
Eosinophils Relative: 2 %
HCT: 36 % (ref 36.0–46.0)
Hemoglobin: 12.5 g/dL (ref 12.0–15.0)
Immature Granulocytes: 0 %
Lymphocytes Relative: 31 %
Lymphs Abs: 1.3 10*3/uL (ref 0.7–4.0)
MCH: 33.1 pg (ref 26.0–34.0)
MCHC: 34.7 g/dL (ref 30.0–36.0)
MCV: 95.2 fL (ref 80.0–100.0)
Monocytes Absolute: 0.3 10*3/uL (ref 0.1–1.0)
Monocytes Relative: 7 %
Neutro Abs: 2.4 10*3/uL (ref 1.7–7.7)
Neutrophils Relative %: 59 %
Platelets: 111 10*3/uL — ABNORMAL LOW (ref 150–400)
RBC: 3.78 MIL/uL — ABNORMAL LOW (ref 3.87–5.11)
RDW: 13 % (ref 11.5–15.5)
WBC: 4 10*3/uL (ref 4.0–10.5)
nRBC: 0 % (ref 0.0–0.2)

## 2021-06-05 LAB — COMPREHENSIVE METABOLIC PANEL
ALT: 42 U/L (ref 0–44)
AST: 34 U/L (ref 15–41)
Albumin: 4.4 g/dL (ref 3.5–5.0)
Alkaline Phosphatase: 87 U/L (ref 38–126)
Anion gap: 7 (ref 5–15)
BUN: 15 mg/dL (ref 6–20)
CO2: 25 mmol/L (ref 22–32)
Calcium: 8.8 mg/dL — ABNORMAL LOW (ref 8.9–10.3)
Chloride: 103 mmol/L (ref 98–111)
Creatinine, Ser: 0.84 mg/dL (ref 0.44–1.00)
GFR, Estimated: >60 mL/min (ref >60)
Glucose, Bld: 117 mg/dL — ABNORMAL HIGH (ref 70–99)
Potassium: 3.8 mmol/L (ref 3.5–5.1)
Sodium: 135 mmol/L (ref 135–145)
Total Bilirubin: 1 mg/dL (ref 0.3–1.2)
Total Protein: 7.8 g/dL (ref 6.5–8.1)

## 2021-06-05 NOTE — Progress Notes (Signed)
Pt presents to [facility] for right shoulder pain and rash to her back below the right shoulder blade. She reports that the rash appeared as one red area about the size of a nickel, but has resolved. Continues to have shoulder pain. Reports pain as intermittent with some activities, no specific activity.

## 2021-06-05 NOTE — Progress Notes (Signed)
[facility] [place]  Telephone:([phone] [phone]  Patient Care Team: Lesleigh Noe, MD as PCP - General (Family Medicine) Reeves Forth (Gastroenterology) Sharlet Salina, MD as Referring Physician (Physical Medicine and Rehabilitation) Maximiano Coss, Loraine Grip, MD (Neurology) Theodore Demark, RN as Oncology Nurse Navigator   Name of the patient: Morgan Sanders  [id number]  [birth year]   Date of visit: 06/05/21  Reason for Consult:  Morgan Sanders is a 45 year old woman with stage Ia right breast cancer status post lumpectomy and adjuvant XRT now on treatment with letrozole.  Patient was seen by Beckey Rutter, NP on 05/14/2021 to discuss hormonal treatment.  Patient presents to [facility] today for evaluation of right shoulder pain persistent over the past 1 to 2 weeks.  She describes pain as being deep in the joint and gets progressively worse with use throughout the day.  Patient has been taking Tylenol arthritis, which helped significantly.  She is also tried OTC creams.  Patient has history of neuropathy but denies significant worsening or weakness.  No history of trauma to shoulder or neck or neck pain.  Patient also had a small raised erythematous rash on her back that was pruritic and lasted for about 3 days prior to completely resolving.  Denies any neurologic complaints. Denies recent fevers or illnesses. Denies any easy bleeding or bruising. Reports good appetite and denies weight loss. Denies chest pain. Denies any nausea, vomiting, constipation, or diarrhea. Denies urinary complaints. Patient offers no further specific complaints today.  PAST MEDICAL HISTORY: Past Medical History:  Diagnosis Date   Allergy    Anemia    Asthma    Blood transfusion without reported diagnosis    Chicken pox    Colon polyps    Depression    Heart murmur    Hypertension    Neuromuscular disorder ([place])    neuropathy   Pneumonia     Stomach ulcer     PAST SURGICAL HISTORY:  Past Surgical History:  Procedure Laterality Date   BREAST BIOPSY Right 01/01/2021   u/s bx 12:00 1 cmfn path pending x clip lateral   BREAST BIOPSY Right 01/01/2021   u/s bx 12:00 1cmfn path pending vision medial   BREAST LUMPECTOMY WITH SENTINEL LYMPH NODE BIOPSY Right 01/18/2021   Procedure: BREAST LUMPECTOMY WITH SENTINEL LYMPH NODE BX;  Surgeon: Robert Bellow, MD;  Location: [facility] ORS;  Service: General;  Laterality: Right;   CHOLECYSTECTOMY     CHOLECYSTECTOMY, LAPAROSCOPIC  03/1996   COLONOSCOPY     ESOPHAGOGASTRODUODENOSCOPY (EGD) WITH PROPOFOL N/A 12/31/2018   Procedure: ESOPHAGOGASTRODUODENOSCOPY (EGD) WITH PROPOFOL;  Surgeon: Lucilla Lame, MD;  Location: [facility] ENDOSCOPY;  Service: Endoscopy;  Laterality: N/A;   TUBAL LIGATION     WISDOM TOOTH EXTRACTION      HEMATOLOGY/ONCOLOGY HISTORY:  Oncology History  Malignant neoplasm of right breast, stage 1, estrogen receptor positive ([place])  01/07/2021 Initial Diagnosis   Malignant neoplasm of right breast, stage 1, estrogen receptor positive ([place])   01/29/2021 Cancer Staging   Staging form: Breast, AJCC 8th Edition - Pathologic stage from 01/29/2021: Stage IA (pT1b, pN1a, cM0, G1, ER+, PR+, HER2-) - Signed by Sindy Guadeloupe, MD on 01/29/2021 Stage prefix: Initial diagnosis Multigene prognostic tests performed: MammaPrint Histologic grading system: 3 grade system     ALLERGIES:  has No Known Allergies.  MEDICATIONS:  Current Outpatient Medications  Medication Sig Dispense Refill   albuterol (VENTOLIN HFA) 108 (90 Base) MCG/ACT inhaler INHALE  2 PUFFS INTO THE LUNGS EVERY 4 (FOUR) HOURS AS NEEDED FOR SHORTNESS OF BREATH OR WHEEZING 8.5 each 2   ALPRAZolam (XANAX) 0.5 MG tablet TAKE 1 TABLET BY MOUTH AS NEEDED FOR ANXIETY. 10 tablet 0   augmented betamethasone dipropionate (DIPROLENE-AF) 0.05 % cream Apply topically.     Biotin w/ Vitamins C & E (HAIR/SKIN/NAILS PO) Take 1 tablet by  mouth daily.     Cholecalciferol (VITAMIN D3) 125 MCG (5000 UT) CAPS Take 5,000 Units by mouth daily.     escitalopram (LEXAPRO) 20 MG tablet TAKE 1 TABLET BY MOUTH EVERY DAY (Patient taking differently: Take 20 mg by mouth daily.) 90 tablet 3   famotidine (PEPCID) 20 MG tablet Take 20 mg by mouth 2 (two) times daily.     letrozole (FEMARA) 2.5 MG tablet Take 1 tablet (2.5 mg total) by mouth daily. 30 tablet 3   loratadine (CLARITIN) 10 MG tablet Take 10 mg by mouth daily.     MAG-G 500 (27 Mg) MG TABS TAKE 1 TABLET (500MG) BY MOUTH EVERY DAY (Patient taking differently: Take 500 mg by mouth daily.) 30 tablet 3   Potassium Chloride ER 20 MEQ TBCR TAKE 1 TABLET BY MOUTH EVERY DAY 90 tablet 1   pregabalin (LYRICA) 300 MG capsule Take 300 mg by mouth 2 (two) times daily.     pyridOXINE (VITAMIN B-6) 100 MG tablet Take 100 mg by mouth daily.     thiamine (VITAMIN B-1) 100 MG tablet Take 250 mg by mouth daily.      vitamin B-12 (CYANOCOBALAMIN) 500 MCG tablet Take 2,500 mcg by mouth daily.     No current facility-administered medications for this visit.    VITAL SIGNS: BP 132/84   Pulse 75   Temp 98.1 F (36.7 C) (Tympanic)   Resp 18   Wt 195 lb (88.5 kg)   SpO2 98%   BMI 30.54 kg/m  Filed Weights   06/05/21 1418  Weight: 195 lb (88.5 kg)    Estimated body mass index is 30.54 kg/m as calculated from the following:   Height as of 01/29/21: 5' 7"  (1.702 m).   Weight as of this encounter: 195 lb (88.5 kg).  LABS: CBC:    Component Value Date/Time   WBC 4.0 06/05/2021 1358   HGB 12.5 06/05/2021 1358   HCT 36.0 06/05/2021 1358   PLT 111 (L) 06/05/2021 1358   MCV 95.2 06/05/2021 1358   NEUTROABS 2.4 06/05/2021 1358   LYMPHSABS 1.3 06/05/2021 1358   MONOABS 0.3 06/05/2021 1358   EOSABS 0.1 06/05/2021 1358   BASOSABS 0.0 06/05/2021 1358   Comprehensive Metabolic Panel:    Component Value Date/Time   NA 135 06/05/2021 1358   K 3.8 06/05/2021 1358   CL 103 06/05/2021 1358    CO2 25 06/05/2021 1358   BUN 15 06/05/2021 1358   CREATININE 0.84 06/05/2021 1358   GLUCOSE 117 (H) 06/05/2021 1358   CALCIUM 8.8 (L) 06/05/2021 1358   AST 34 06/05/2021 1358   ALT 42 06/05/2021 1358   ALKPHOS 87 06/05/2021 1358   BILITOT 1.0 06/05/2021 1358   PROT 7.8 06/05/2021 1358   ALBUMIN 4.4 06/05/2021 1358    RADIOGRAPHIC STUDIES: No results found.  PERFORMANCE STATUS (ECOG) : 0 - Asymptomatic  Review of Systems Unless otherwise noted, a complete review of systems is negative.  Physical Exam General: NAD Cardiovascular: regular rate and rhythm Pulmonary: clear ant fields Abdomen: soft, nontender, + bowel sounds GU: no suprapubic tenderness Extremities:  no edema, no joint deformities, no pain with palpation, full range of motion of shoulder and neck, no evidence of trauma or bruising Skin: no rashes Neurological: Grossly nonfocal  Assessment and Plan- Patient is a 45 y.o. 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  Will send for x-ray and then patient will likely benefit from contacting her PCP for further management.  Note that letrozole can cause arthralgias but it would be atypical for this to be isolated to one specific joint.  Otherwise, patient appears to be doing reasonably well.  Case and plan discussed with Dr. Janese Banks.  RTC as needed.   Patient expressed understanding and was in agreement with this plan. She also understands that She can call clinic at any time with any questions, concerns, or complaints.   Thank you for allowing me to participate in the care of this very pleasant patient.   Time Total: 20 minutes  Visit consisted of counseling and education dealing with the complex and emotionally  intense issues of symptom management and palliative care in the setting of serious and potentially life-threatening illness.Greater than 50%  of this time was spent counseling and coordinating care related to the above assessment and plan.  Signed by: Altha Harm, PhD, NP-C

## 2021-06-06 ENCOUNTER — Telehealth: Payer: Self-pay | Admitting: *Deleted

## 2021-06-06 NOTE — Telephone Encounter (Signed)
Patient called asking for results of xray yesterday  EXAM: RIGHT SHOULDER - 1 VIEW   COMPARISON:  None.   FINDINGS: Degenerative changes of the acromioclavicular joint are noted. No acute fracture or dislocation is seen. Visualized underlying bony thorax is within normal limits.   IMPRESSION: Degenerative change without acute abnormality.     Electronically Signed   By: Inez Catalina M.D.   On: 06/06/2021 03:23  Does anything else need to be done

## 2021-06-07 NOTE — Telephone Encounter (Signed)
Call returned to patient and went over results and advised per NP that she needs to follow up with her PCP. She states she has a follow up appointment Monday with PCP and thanked me for letting her know

## 2021-06-17 ENCOUNTER — Other Ambulatory Visit: Payer: Self-pay

## 2021-06-17 ENCOUNTER — Ambulatory Visit (INDEPENDENT_AMBULATORY_CARE_PROVIDER_SITE_OTHER): Payer: BC Managed Care – PPO | Admitting: Family Medicine

## 2021-06-17 ENCOUNTER — Encounter: Payer: Self-pay | Admitting: Radiation Oncology

## 2021-06-17 ENCOUNTER — Ambulatory Visit
Admission: RE | Admit: 2021-06-17 | Discharge: 2021-06-17 | Disposition: A | Discharge status: Home / Self Care | Payer: BC Managed Care – PPO | Source: Ambulatory Visit | Attending: Radiation Oncology | Admitting: Radiation Oncology

## 2021-06-17 VITALS — BP 130/72 | HR 81 | Temp 97.4°F | Ht 65.5 in | Wt 194.5 lb

## 2021-06-17 VITALS — BP 134/86 | HR 72 | Temp 98.0°F | Resp 16 | Wt 191.0 lb

## 2021-06-17 DIAGNOSIS — F411 Generalized anxiety disorder: Secondary | ICD-10-CM

## 2021-06-17 DIAGNOSIS — Z79811 Long term (current) use of aromatase inhibitors: Secondary | ICD-10-CM | POA: Insufficient documentation

## 2021-06-17 DIAGNOSIS — M25512 Pain in left shoulder: Secondary | ICD-10-CM | POA: Diagnosis not present

## 2021-06-17 DIAGNOSIS — Z170 Estrogen receptor positive status [ER+]: Secondary | ICD-10-CM | POA: Insufficient documentation

## 2021-06-17 DIAGNOSIS — C50911 Malignant neoplasm of unspecified site of right female breast: Secondary | ICD-10-CM | POA: Insufficient documentation

## 2021-06-17 DIAGNOSIS — Z23 Encounter for immunization: Secondary | ICD-10-CM

## 2021-06-17 DIAGNOSIS — M25511 Pain in right shoulder: Secondary | ICD-10-CM | POA: Diagnosis not present

## 2021-06-17 DIAGNOSIS — T7840XD Allergy, unspecified, subsequent encounter: Secondary | ICD-10-CM

## 2021-06-17 DIAGNOSIS — Z923 Personal history of irradiation: Secondary | ICD-10-CM | POA: Insufficient documentation

## 2021-06-17 MED ORDER — ALBUTEROL SULFATE HFA 108 (90 BASE) MCG/ACT IN AERS: 2.0000 | INHALATION_SPRAY | RESPIRATORY_TRACT | 2 refills | Status: DC | PRN

## 2021-06-17 MED ORDER — ALPRAZOLAM 0.5 MG PO TABS: 0.5000 mg | ORAL_TABLET | Freq: Every day | ORAL | 0 refills | Status: DC | PRN

## 2021-06-17 NOTE — Assessment & Plan Note (Signed)
Refill albuterol. Advised daily allergy pill. No longer smoking but 2nd hand exposure could be contributing. Advised f/u if needing albuterol >4 times per week.

## 2021-06-17 NOTE — Progress Notes (Signed)
Radiation Oncology Follow up Note  Name: Morgan Sanders   Date:   06/17/2021 MRN:  [id number] DOB: Jan 09, 1976    This 45 y.o. female presents to the clinic today for 1 month follow-up status post whole breast radiation for stage I BN1AM0 ER/PR positive invasive mammary carcinoma of the right breast.  REFERRING PROVIDER: Lesleigh Noe, MD  HPI: Patient is a 45 year old female now at 1 month having completed whole breast radiation to her right breast for stage T1BN1A invasive mammary carcinoma ER/PR positive HER2 negative.  Seen today in routine follow-up she is doing well.  She specifically denies breast tenderness cough or bone pain.  She is currently on letrozole tolerating that well without side effect.  COMPLICATIONS OF TREATMENT: none  FOLLOW UP COMPLIANCE: keeps appointments   PHYSICAL EXAM:  BP 134/86   Pulse 72   Temp 98 F (36.7 C) (Tympanic)   Resp 16   Wt 191 lb (86.6 kg)   BMI 29.91 kg/m  Lungs are clear to A&P cardiac examination essentially unremarkable with regular rate and rhythm. No dominant mass or nodularity is noted in either breast in 2 positions examined. Incision is well-healed. No axillary or supraclavicular adenopathy is appreciated. Cosmetic result is excellent.  Well-developed well-nourished patient in NAD. HEENT reveals PERLA, EOMI, discs not visualized.  Oral cavity is clear. No oral mucosal lesions are identified. Neck is clear without evidence of cervical or supraclavicular adenopathy. Lungs are clear to A&P. Cardiac examination is essentially unremarkable with regular rate and rhythm without murmur rub or thrill. Abdomen is benign with no organomegaly or masses noted. Motor sensory and DTR levels are equal and symmetric in the upper and lower extremities. Cranial nerves II through XII are grossly intact. Proprioception is intact. No peripheral adenopathy or edema is identified. No motor or sensory levels are noted. Crude visual fields are within normal  range.  RADIOLOGY RESULTS: No current films for review  PLAN: Present time patient is 1 month out from whole breast radiation doing well with no side effects.  Pleased with her overall progress cosmetic result is excellent.  She continues on letrozole without side effect.  I have asked to see her back in 4 to 5 months for follow-up.  Patient knows to call with any concerns.  I would like to take this opportunity to thank you for allowing me to participate in the care of your patient.Noreene Filbert, MD

## 2021-06-17 NOTE — Patient Instructions (Addendum)
#  Wheezing - if using albuterol more than 3 times per week without improvement - call and may need to consider additional work-up - Try taking a daily allergy pill to see if that helps  #Anxiety - continue taking lexapro - and xanax as needed  #Shoulder pain - start with physical therapy - see Dr. Lorelei Pont if no improvement after 6-8 weeks

## 2021-06-17 NOTE — Assessment & Plan Note (Signed)
Doing well. Cont lexapro 20 mg. Cont prn xanax - use significantly reduced over the last few months since completing cancer treatment. Return prn if worsening

## 2021-06-17 NOTE — Assessment & Plan Note (Signed)
Reviewed recent XR with some signs of arthritis. Suspect this is the case. Advised trial of PT and f/u with sports medicine/ortho to consider injections if still getting pain.

## 2021-06-17 NOTE — Progress Notes (Signed)
Subjective:     Morgan Sanders is a 45 y.o. female presenting for Follow-up (Anxiety ) and Shoulder Pain (Both hurt the same but has recent XR on right one. )     Shoulder Pain    #Anxiety - doing better overall - continues to take lexapro which is working well  - needing less now  #allergies - still getting wheezing - using albuterol 3 times a week - less effective than before   #Shoulder pain - started 3 weeks ago - was on the left and then the right - was trying OTC medications w/o improvement - felt like bone scrubbing - XR with some degenerative changes - still will move a certain way and it just feels like bone rubbing   Review of Systems   Social History   Tobacco Use  Smoking Status Former   Packs/day: 1.00   Years: 20.00   Pack years: 20.00   Types: Cigarettes  Smokeless Tobacco Never        Objective:    BP Readings from Last 3 Encounters:  06/17/21 130/72  06/17/21 134/86  06/05/21 132/84   Wt Readings from Last 3 Encounters:  06/17/21 194 lb 8 oz (88.2 kg)  06/17/21 191 lb (86.6 kg)  06/05/21 195 lb (88.5 kg)    BP 130/72   Pulse 81   Temp (!) 97.4 F (36.3 C) (Temporal)   Ht 5' 5.5" (1.664 m)   Wt 194 lb 8 oz (88.2 kg)   SpO2 98%   BMI 31.87 kg/m    Physical Exam Constitutional:      General: She is not in acute distress.    Appearance: She is well-developed. She is not diaphoretic.  HENT:     Right Ear: External ear normal.     Left Ear: External ear normal.  Eyes:     Conjunctiva/sclera: Conjunctivae normal.  Cardiovascular:     Rate and Rhythm: Normal rate and regular rhythm.     Heart sounds: No murmur heard. Pulmonary:     Effort: Pulmonary effort is normal. No respiratory distress.     Breath sounds: Normal breath sounds. No wheezing.  Musculoskeletal:     Cervical back: Neck supple.     Comments: Shoulder  Right and left Inspection: no step off ROM: normal Palpation: no ttp Strength: normal  Left  -  some pain with hawkings  Right no pain with hawkings  Skin:    General: Skin is warm and dry.     Capillary Refill: Capillary refill takes less than 2 seconds.  Neurological:     Mental Status: She is alert. Mental status is at baseline.  Psychiatric:        Mood and Affect: Mood normal.        Behavior: Behavior normal.          Assessment & Plan:   Problem List Items Addressed This Visit       Other   Allergies    Refill albuterol. Advised daily allergy pill. No longer smoking but 2nd hand exposure could be contributing. Advised f/u if needing albuterol >4 times per week.       Relevant Medications   albuterol (VENTOLIN HFA) 108 (90 Base) MCG/ACT inhaler   Generalized anxiety disorder - Primary    Doing well. Cont lexapro 20 mg. Cont prn xanax - use significantly reduced over the last few months since completing cancer treatment. Return prn if worsening      Relevant Medications  ALPRAZolam (XANAX) 0.5 MG tablet   Acute pain of both shoulders    Reviewed recent XR with some signs of arthritis. Suspect this is the case. Advised trial of PT and f/u with sports medicine/ortho to consider injections if still getting pain.       Relevant Orders   Ambulatory referral to Physical Therapy   Other Visit Diagnoses     Need for influenza vaccination       Relevant Orders   Flu Vaccine QUAD 59mo+IM (Fluarix, Fluzone & Alfiuria Quad PF) (Completed)        Return in about 6 weeks (around 07/29/2021) for Dr. Lorelei Pont for shoulder .  Lesleigh Noe, MD  This visit occurred during the SARS-CoV-2 public health emergency.  Safety protocols were in place, including screening questions prior to the visit, additional usage of staff PPE, and extensive cleaning of exam room while observing appropriate contact time as indicated for disinfecting solutions.

## 2021-06-20 DIAGNOSIS — H35711 Central serous chorioretinopathy, right eye: Secondary | ICD-10-CM | POA: Diagnosis not present

## 2021-06-29 ENCOUNTER — Other Ambulatory Visit: Payer: Self-pay | Admitting: Family Medicine

## 2021-06-29 DIAGNOSIS — Z23 Encounter for immunization: Secondary | ICD-10-CM

## 2021-06-29 DIAGNOSIS — F331 Major depressive disorder, recurrent, moderate: Secondary | ICD-10-CM

## 2021-07-01 ENCOUNTER — Other Ambulatory Visit: Payer: Self-pay | Admitting: Oncology

## 2021-07-03 ENCOUNTER — Telehealth: Payer: Self-pay | Admitting: Family Medicine

## 2021-07-03 NOTE — Telephone Encounter (Signed)
  Encourage patient to contact the pharmacy for refills or they can request refills through [place]:  Please schedule appointment if longer than 1 year  NEXT APPOINTMENT DATE:no future appt  MEDICATION:escitalopram (LEXAPRO) 20 MG tablet  Is the patient out of medication?   PHARMACY:[id number] - [place], [place]  Let patient know to contact pharmacy at the end of the day to make sure medication is ready.  Please notify patient to allow 48-72 hours to process  CLINICAL FILLS OUT ALL BELOW:   LAST REFILL:  QTY:  REFILL DATE:    OTHER COMMENTS:    Okay for refill?  Please advise

## 2021-07-03 NOTE — Telephone Encounter (Signed)
Mychart message sent to pt. Refill of #90 w/ 3 sent in on 12/12/20.

## 2021-07-04 NOTE — Telephone Encounter (Signed)
Spoke to pharmacy and pt does have refills. Pt made aware,

## 2021-08-07 ENCOUNTER — Telehealth: Payer: Self-pay | Admitting: *Deleted

## 2021-08-07 NOTE — Telephone Encounter (Signed)
Patient called reporting that her right breast, the one she had surgery in is getting larger and she is now having pain to the right side of the right breast and is asking who she needs to see for this. Please advise

## 2021-08-07 NOTE — Telephone Encounter (Signed)
Call returned to patient and advised to contact Dr Bary Castilla. She said she will

## 2021-08-08 DIAGNOSIS — Z79899 Other long term (current) drug therapy: Secondary | ICD-10-CM | POA: Diagnosis not present

## 2021-08-08 DIAGNOSIS — L03818 Cellulitis of other sites: Secondary | ICD-10-CM | POA: Diagnosis not present

## 2021-08-20 ENCOUNTER — Inpatient Hospital Stay: Payer: BC Managed Care – PPO | Attending: Oncology

## 2021-08-20 ENCOUNTER — Inpatient Hospital Stay (HOSPITAL_BASED_OUTPATIENT_CLINIC_OR_DEPARTMENT_OTHER): Payer: BC Managed Care – PPO | Admitting: Oncology

## 2021-08-20 ENCOUNTER — Encounter: Payer: Self-pay | Admitting: Oncology

## 2021-08-20 ENCOUNTER — Other Ambulatory Visit: Payer: Self-pay

## 2021-08-20 VITALS — BP 136/89 | HR 74 | Temp 98.8°F | Resp 16 | Ht 65.0 in | Wt 195.0 lb

## 2021-08-20 DIAGNOSIS — Z79811 Long term (current) use of aromatase inhibitors: Secondary | ICD-10-CM | POA: Diagnosis not present

## 2021-08-20 DIAGNOSIS — Z923 Personal history of irradiation: Secondary | ICD-10-CM | POA: Diagnosis not present

## 2021-08-20 DIAGNOSIS — Z170 Estrogen receptor positive status [ER+]: Secondary | ICD-10-CM | POA: Diagnosis not present

## 2021-08-20 DIAGNOSIS — Z08 Encounter for follow-up examination after completed treatment for malignant neoplasm: Secondary | ICD-10-CM | POA: Diagnosis not present

## 2021-08-20 DIAGNOSIS — C50911 Malignant neoplasm of unspecified site of right female breast: Secondary | ICD-10-CM | POA: Diagnosis not present

## 2021-08-20 DIAGNOSIS — Z853 Personal history of malignant neoplasm of breast: Secondary | ICD-10-CM

## 2021-08-20 LAB — COMPREHENSIVE METABOLIC PANEL
ALT: 25 U/L (ref 0–44)
AST: 26 U/L (ref 15–41)
Albumin: 4.6 g/dL (ref 3.5–5.0)
Alkaline Phosphatase: 71 U/L (ref 38–126)
Anion gap: 10 (ref 5–15)
BUN: 15 mg/dL (ref 6–20)
CO2: 23 mmol/L (ref 22–32)
Calcium: 9.4 mg/dL (ref 8.9–10.3)
Chloride: 103 mmol/L (ref 98–111)
Creatinine, Ser: 0.95 mg/dL (ref 0.44–1.00)
GFR, Estimated: >60 mL/min (ref >60)
Glucose, Bld: 112 mg/dL — ABNORMAL HIGH (ref 70–99)
Potassium: 3.8 mmol/L (ref 3.5–5.1)
Sodium: 136 mmol/L (ref 135–145)
Total Bilirubin: 1 mg/dL (ref 0.3–1.2)
Total Protein: 8 g/dL (ref 6.5–8.1)

## 2021-08-20 LAB — CBC WITH DIFFERENTIAL/PLATELET
Abs Immature Granulocytes: 0.01 10*3/uL (ref 0.00–0.07)
Basophils Absolute: 0 10*3/uL (ref 0.0–0.1)
Basophils Relative: 1 %
Eosinophils Absolute: 0.1 10*3/uL (ref 0.0–0.5)
Eosinophils Relative: 1 %
HCT: 37.7 % (ref 36.0–46.0)
Hemoglobin: 13.3 g/dL (ref 12.0–15.0)
Immature Granulocytes: 0 %
Lymphocytes Relative: 36 %
Lymphs Abs: 1.7 10*3/uL (ref 0.7–4.0)
MCH: 33.3 pg (ref 26.0–34.0)
MCHC: 35.3 g/dL (ref 30.0–36.0)
MCV: 94.3 fL (ref 80.0–100.0)
Monocytes Absolute: 0.4 10*3/uL (ref 0.1–1.0)
Monocytes Relative: 8 %
Neutro Abs: 2.7 10*3/uL (ref 1.7–7.7)
Neutrophils Relative %: 54 %
Platelets: 136 10*3/uL — ABNORMAL LOW (ref 150–400)
RBC: 4 MIL/uL (ref 3.87–5.11)
RDW: 12.4 % (ref 11.5–15.5)
WBC: 4.9 10*3/uL (ref 4.0–10.5)
nRBC: 0 % (ref 0.0–0.2)

## 2021-08-20 NOTE — Progress Notes (Signed)
Pt says she has some beast pain that comes and goes and swelling and she got u/s and did not see anything

## 2021-08-21 ENCOUNTER — Telehealth: Payer: Self-pay

## 2021-08-21 DIAGNOSIS — Z20822 Contact with and (suspected) exposure to covid-19: Secondary | ICD-10-CM | POA: Diagnosis not present

## 2021-08-21 DIAGNOSIS — R519 Headache, unspecified: Secondary | ICD-10-CM | POA: Diagnosis not present

## 2021-08-21 DIAGNOSIS — U071 COVID-19: Secondary | ICD-10-CM | POA: Diagnosis not present

## 2021-08-21 LAB — ESTRADIOL: Estradiol: <5 pg/mL

## 2021-08-21 LAB — FSH/LH
FSH: 120 m[IU]/mL
LH: 47.4 m[IU]/mL

## 2021-08-21 NOTE — Telephone Encounter (Signed)
NotedCatalina Sanders, FYI as it looks like she has an appointment scheduled with you for 01/05 (tomorrow), but not sure if she will need to be seen by you if she's going to Urgent Care today?

## 2021-08-21 NOTE — Telephone Encounter (Signed)
Noted. Did not see a completed note from UC. Will check with patient tomorrow

## 2021-08-21 NOTE — Telephone Encounter (Signed)
I spoke with pt; this morning 08/21/21 pt tested + covid with a home test. Pt symptoms started 08/21/21 with prod cough with clear phlegm that is thick and green tinged. Runny nose, and head congestion, pt having chills and body aches, pt has had nausea but no vomiting and pt had diarrhea on 08/20/21 but pt has UC. Pt has h/a on and off with pain level of 4. Pt does have wheezing and when pt coughs it hurts in her chest. S/T and it does hurt when pt swallows.  Pt does not think she has a fever and no SOB and no loss of taste or smell. Pts husband tested + covid on 08/20/21. No available appts at [facility] today or tomorrow and pt is in no distress with breathing at this time. Pt prefers not to wait at [facility] appts are gone for today. I checked [facility] in [place] has one appt left for today at 4:30.. I scheduled appt for pt and advised pt to Self quarantine, drink plenty of fluids, rest, and take Tylenol for fever. UC & ED precautions given and pt voiced understanding. Pt was appreciative for call and for scheduling the [facility] appt for pt. Sending note to Gentry Fitz NP and [facility] and will teams [place].

## 2021-08-21 NOTE — Telephone Encounter (Signed)
[place] Night - Client TELEPHONE ADVICE RECORD [facility] Patient Name: Morgan Sanders Gender: Female DOB: [birth year] Age: 46 Y 25 D Return Phone Number: [phone] (Primary), [phone] (Secondary) Address: City/ State/ Zip: [place] [address] Client [place] Night - Client Client Site [place] Provider Waunita Schooner- MD Contact Type Call Who Is Calling Patient / Member / Family / Caregiver Call Type Triage / Clinical Caller Name Alyna Stensland Relationship To Patient Self Return Phone Number [phone] (Primary) Chief Complaint Cough Reason for Call Symptomatic / Request for [place] states she has a cough, nasal drip , nausea, and chills. She has been exposed to covid by husband. Translation No Nurse Assessment Nurse: Lucky Cowboy, RN, Levada Dy Date/Time (Eastern Time): 08/21/2021 8:23:43 AM Confirm and document reason for call. If symptomatic, describe symptoms. ---Caller stated that she has cough, post-nasal drip, nausea, chills since this morning. She started having s/ s this morning. Her husband has COVID. Does the patient have any new or worsening symptoms? ---Yes Will a triage be completed? ---Yes Related visit to physician within the last 2 weeks? ---Yes Does the PT have any chronic conditions? (i.e. diabetes, asthma, this includes High risk factors for pregnancy, etc.) ---Yes List chronic conditions. ---hx breast cancer, neuropathy, GERD Is the patient pregnant or possibly pregnant? (Ask all females between the ages of 81-55) ---No Is this a behavioral health or substance abuse call? ---No Guidelines Guideline Title Affirmed Question Affirmed Notes Nurse Date/Time (Eastern Time) COVID-19 - Diagnosed or Suspected [1] HIGH RISK for severe COVID complications (e.g., weak immune system, age > 61 years, obesity with BMI 30 or  higher, Dew, RN, Levada Dy 08/21/2021 8:25:34 AM PLEASE NOTE: All timestamps contained within this report are represented as [place] Standard Time. CONFIDENTIALTY NOTICE: This fax transmission is intended only for the addressee. It contains information that is legally privileged, confidential or otherwise protected from use or disclosure. If you are not the intended recipient, you are strictly prohibited from reviewing, disclosing, copying using or disseminating any of this information or taking any action in reliance on or regarding this information. If you have received this fax in error, please notify us immediately by telephone so that we can arrange for its return to [place]. Phone: [phone], Toll-Free: [phone], Fax: [phone] Page: 2 of 3 Call Id: [id number] Guidelines Guideline Title Affirmed Question Affirmed Notes Nurse Date/Time Eilene Ghazi Time) pregnant, chronic lung disease or other chronic medical condition) AND [2] COVID symptoms (e.g., cough, fever) (Exceptions: Already seen by PCP and no new or worsening symptoms.) Disp. Time Eilene Ghazi Time) Disposition Final User 08/21/2021 8:30:15 AM Call PCP within 24 Hours Yes Dew, RN, Marin Shutter Disagree/Comply Comply Caller Understands Yes PreDisposition Call Doctor Care Advice Given Per Guideline CALL PCP WITHIN 24 HOURS: * You need to discuss this with your doctor (or NP/PA) within the next 24 hours. * IF OFFICE WILL BE OPEN: Call the office when it opens tomorrow morning. GENERAL CARE ADVICE FOR COVID-19 SYMPTOMS: * The symptoms are generally treated the same whether you have COVID-19, influenza or some other respiratory virus. * Cough: Use cough drops. * Feeling dehydrated: Drink extra liquids. If the air in your home is dry, use a humidifier. * Fever: For fever over 101 F (38.3 C), take acetaminophen every 4 to 6 hours (Adults 650 mg) OR ibuprofen every 6 to 8 hours (Adults 400 mg). Before taking any medicine, read all the  instructions on  the package. Do not take aspirin unless your doctor has prescribed it for you. * Muscle aches, headache, and other pains: Often this comes and goes with the fever. Take acetaminophen every 4 to 6 hours (Adults 650 mg) OR ibuprofen every 6 to 8 hours (Adults 400 mg). Before taking any medicine, read all the instructions on the package. * Sore throat: Try throat lozenges, hard candy or warm chicken broth. COVID-19 - HOW TO PROTECT OTHERS - WHEN YOU ARE SICK WITH COVID-19: * STAY HOME A MINIMUM OF 5 DAYS: People with MILD COVID-19 can STOP HOME ISOLATION AFTER 5 DAYS if (1) fever has been gone for 24 hours (without using fever medicine) AND (2) symptoms are better. Continue to wear a well-fitted mask for a full 10 days when around others. * WEAR A MASK FOR 10 DAYS: Wear a well-fitted mask for 10 full days any time you are around others inside your home or in public. Do not go to places where you are unable to wear a mask. * [place] HANDS OFTEN: Wash hands often with soap and water. After coughing or sneezing are important times. If soap and water are not available, use an alcohol-based hand sanitizer with at least 60% alcohol, covering all surfaces of your hands and rubbing them together until they feel dry. Avoid touching your eyes, nose, and mouth with unwashed hands. * AVOID TRAVEL: Avoid travel for 10 days after you tested positive for COVID-19. * CALL AHEAD IF MEDICAL CARE IS NEEDED: If you have a medical appointment, call your doctor's office and tell them you have or may have COVID-19. This will help the office protect themselves and other patients. They will give you directions. CALL BACK IF: * You become worse CARE ADVICE given per COVID-19 - DIAGNOSED OR SUSPECTED (Adult) guideline. PLEASE NOTE: All timestamps contained within this report are represented as [place] Standard Time. CONFIDENTIALTY NOTICE: This fax transmission is intended only for the addressee. It contains  information that is legally privileged, confidential or otherwise protected from use or disclosure. If you are not the intended recipient, you are strictly prohibited from reviewing, disclosing, copying using or disseminating any of this information or taking any action in reliance on or regarding this information. If you have received this fax in error, please notify us immediately by telephone so that we can arrange for its return to [place]. Phone: [phone], Toll-Free: [phone], Fax: [phone] Page: 3 of 3 Call Id: [id number] Referrals REFERRED TO PCP OFFIC

## 2021-08-22 ENCOUNTER — Telehealth: Payer: BC Managed Care – PPO | Admitting: Nurse Practitioner

## 2021-08-22 ENCOUNTER — Telehealth: Payer: Self-pay | Admitting: Oncology

## 2021-08-22 ENCOUNTER — Other Ambulatory Visit: Payer: Self-pay

## 2021-08-22 ENCOUNTER — Encounter: Payer: Self-pay | Admitting: *Deleted

## 2021-08-22 NOTE — Telephone Encounter (Signed)
Pt called and wants someone to explain her lab results. Call back at [phone]

## 2021-08-22 NOTE — Progress Notes (Signed)
Hematology/Oncology Consult note [facility]  [phone] Fax:([phone]  Patient Care Team: Lesleigh Noe, MD as PCP - General (Family Medicine) Reeves Forth (Gastroenterology) Sharlet Salina, MD as Referring Physician (Physical Medicine and Rehabilitation) Maximiano Coss, Loraine Grip, MD (Neurology) Theodore Demark, RN as Oncology Nurse Navigator   Name of the patient: Morgan Sanders  [id number]  December 13, 1975   Date of visit: 08/22/21  Diagnosis- pathological prognostic stage Ia invasive mammary carcinoma of the right breast and pT1b pN1 acM0 ER/PR positive HER2 negative  Chief complaint/ Reason for visit-routine follow-up of breast cancer on letrozole  Heme/Onc history: Patient is a 46 year old post menopausal female with a prior history of normocytic anemia for which she had a complete work-up including bone marrow biopsy as well as history of cirrhosis of the liver.  She is postmenopausal and she has not had any menstrual cycles since the age of 52.  Patient underwent a bilateral screening mammogram on 12/21/2020 which showed possible mass in the right breast.  This was followed by diagnostic mammogram and ultrasound which showed 2 masses in the right breast at 12 o'clock position each measuring 6 mm and 1.2 cm apart.  Ultrasound of the right axilla was negative for malignancy.  Patient underwent biopsy of both the medial and the lateral breast mass and it was consistent with invasive mammary carcinoma grade 2.  ER greater than 90% positive, PR greater than 90% positive and HER2 negative   Patient underwent right lumpectomy with sentinel lymph node biopsy Final pathology showed 2 tumors 7 mm in size both of which were grade 1.  The tissue in between did not have any evidence of malignancy or DCIS.  She was noted to have metastatic carcinoma in 2 out of 4 sentinel lymph node 6 mm deposit with no extracapsular extension.   MammaPrint score came  back at low risk and therefore patient did not require any adjuvant chemotherapy.  Patient completed radiation treatment and startedLetrozole in August 2022  Interval history-she is tolerating letrozole well.  Denies any specific complaints at this time.  She is also taking her calcium and vitamin D regularly  ECOG PS- 0 Pain scale- 0   Review of systems- Review of Systems  Constitutional:  Negative for chills, fever, malaise/fatigue and weight loss.  HENT:  Negative for congestion, ear discharge and nosebleeds.   Eyes:  Negative for blurred vision.  Respiratory:  Negative for cough, hemoptysis, sputum production, shortness of breath and wheezing.   Cardiovascular:  Negative for chest pain, palpitations, orthopnea and claudication.  Gastrointestinal:  Negative for abdominal pain, blood in stool, constipation, diarrhea, heartburn, melena, nausea and vomiting.  Genitourinary:  Negative for dysuria, flank pain, frequency, hematuria and urgency.  Musculoskeletal:  Negative for back pain, joint pain and myalgias.  Skin:  Negative for rash.  Neurological:  Negative for dizziness, tingling, focal weakness, seizures, weakness and headaches.  Endo/Heme/Allergies:  Does not bruise/bleed easily.  Psychiatric/Behavioral:  Negative for depression and suicidal ideas. The patient does not have insomnia.      No Known Allergies   Past Medical History:  Diagnosis Date   Allergy    Anemia    Asthma    Blood transfusion without reported diagnosis    Breast cancer ([place])    Chicken pox    Colon polyps    Depression    Heart murmur    Hypertension    Neuromuscular disorder (HCC)    neuropathy  Pneumonia    Stomach ulcer      Past Surgical History:  Procedure Laterality Date   BREAST BIOPSY Right 01/01/2021   u/s bx 12:00 1 cmfn path pending x clip lateral   BREAST BIOPSY Right 01/01/2021   u/s bx 12:00 1cmfn path pending vision medial   BREAST LUMPECTOMY WITH SENTINEL LYMPH NODE BIOPSY  Right 01/18/2021   Procedure: BREAST LUMPECTOMY WITH SENTINEL LYMPH NODE BX;  Surgeon: Robert Bellow, MD;  Location: [facility] ORS;  Service: General;  Laterality: Right;   CHOLECYSTECTOMY     CHOLECYSTECTOMY, LAPAROSCOPIC  03/1996   COLONOSCOPY     ESOPHAGOGASTRODUODENOSCOPY (EGD) WITH PROPOFOL N/A 12/31/2018   Procedure: ESOPHAGOGASTRODUODENOSCOPY (EGD) WITH PROPOFOL;  Surgeon: Lucilla Lame, MD;  Location: [facility] ENDOSCOPY;  Service: Endoscopy;  Laterality: N/A;   TUBAL LIGATION     WISDOM TOOTH EXTRACTION      Social History   Socioeconomic History   Marital status: Married    Spouse name: [name]   Number of children: 2   Years of education: high school   Highest education level: 12th grade  Occupational History   Occupation: Golf/clubhouse attendent  Tobacco Use   Smoking status: Former    Packs/day: 1.00    Years: 20.00    Pack years: 20.00    Types: Cigarettes    Quit date: 2020    Years since quitting: 3.0   Smokeless tobacco: Never  Vaping Use   Vaping Use: Some days   Substances: Nicotine, Flavoring  Substance and Sexual Activity   Alcohol use: Yes    Comment: a few times a year   Drug use: Never   Sexual activity: Not Currently  Other Topics Concern   Not on file  Social History Narrative   02/07/20   From: the area   Living: with husband, [name] (2000)   Work: at a golf course      Family: daughters - CJ (lives at [employer], 2 children) and Art gallery manager (still living at home, in Sports coach school)      Enjoys: golf       Exercise: golfing 3-4 times a week - alternating between cart/walking   Diet: pretty good - eggs/fruit, eats lunch and dinner      Safety   Seat belts: Yes    Guns: Yes  and secure   Safe in relationships: Yes    Social Determinants of Radio broadcast assistant Strain: Not on file  Food Insecurity: Not on file  Transportation Needs: Not on file  Physical Activity: Not on file  Stress: Not on file  Social Connections: Not on file  Intimate  Partner Violence: Not on file    Family History  Problem Relation Age of Onset   Breast cancer Maternal Grandmother 78   Arthritis Maternal Grandmother    Cancer Maternal Grandmother    Diabetes Maternal Grandmother    Hearing loss Maternal Grandmother    Hypertension Maternal Grandmother    Bone cancer Maternal Great-grandmother    Hypertension Mother    Diabetes Mother    Hyperlipidemia Mother    Kidney disease Mother    Arthritis Mother    Asthma Mother    Depression Mother    Sudden death Father        hit by train   Liver disease Father        hx of liver transplant   Early death Father    Cataracts Paternal Grandfather    Lung cancer Paternal Grandfather  Hyperlipidemia Sister    Hypertension Sister    Depression Daughter    Early death Maternal Grandfather    Depression Daughter      Current Outpatient Medications:    albuterol (VENTOLIN HFA) 108 (90 Base) MCG/ACT inhaler, Inhale 2 puffs into the lungs every 4 (four) hours as needed for shortness of breath or wheezing., Disp: 18 g, Rfl: 2   ALPRAZolam (XANAX) 0.5 MG tablet, Take 1 tablet (0.5 mg total) by mouth daily as needed for anxiety. TAKE 1 TABLET BY MOUTH AS NEEDED FOR ANXIETY., Disp: 30 tablet, Rfl: 0   Biotin w/ Vitamins C & E (HAIR/SKIN/NAILS PO), Take 1 tablet by mouth daily., Disp: , Rfl:    Cholecalciferol (VITAMIN D3) 125 MCG (5000 UT) CAPS, Take 5,000 Units by mouth daily., Disp: , Rfl:    escitalopram (LEXAPRO) 20 MG tablet, TAKE 1 TABLET BY MOUTH EVERY DAY (Patient taking differently: Take 20 mg by mouth daily.), Disp: 90 tablet, Rfl: 3   famotidine (PEPCID) 20 MG tablet, Take 20 mg by mouth 2 (two) times daily., Disp: , Rfl:    letrozole (FEMARA) 2.5 MG tablet, TAKE 1 TABLET BY MOUTH EVERY DAY, Disp: 30 tablet, Rfl: 3   loratadine (CLARITIN) 10 MG tablet, Take 10 mg by mouth daily as needed., Disp: , Rfl:    MAG-G 500 (27 Mg) MG TABS, TAKE 1 TABLET (500MG) BY MOUTH EVERY DAY (Patient taking  differently: Take 500 mg by mouth daily.), Disp: 30 tablet, Rfl: 3   Potassium Chloride ER 20 MEQ TBCR, TAKE 1 TABLET BY MOUTH EVERY DAY, Disp: 90 tablet, Rfl: 1   pregabalin (LYRICA) 200 MG capsule, Take 200 mg by mouth 2 (two) times daily., Disp: , Rfl:    pyridOXINE (VITAMIN B-6) 100 MG tablet, Take 100 mg by mouth daily., Disp: , Rfl:    thiamine (VITAMIN B-1) 100 MG tablet, Take 250 mg by mouth daily. , Disp: , Rfl:    vitamin B-12 (CYANOCOBALAMIN) 500 MCG tablet, Take 2,500 mcg by mouth daily., Disp: , Rfl:   Physical exam:  Vitals:   08/20/21 1444  BP: 136/89  Pulse: 74  Resp: 16  Temp: 98.8 F (37.1 C)  TempSrc: Tympanic  Weight: 195 lb (88.5 kg)  Height: _0  (1.651 m)   Physical Exam Cardiovascular:     Rate and Rhythm: Normal rate and regular rhythm.     Heart sounds: Normal heart sounds.  Pulmonary:     Effort: Pulmonary effort is normal.     Breath sounds: Normal breath sounds.  Abdominal:     General: Bowel sounds are normal.     Palpations: Abdomen is soft.  Skin:    General: Skin is warm and dry.  Neurological:     Mental Status: She is alert and oriented to person, place, and time.   Breast exam was performed in seated and lying down position. Patient is status post right lumpectomy with a well-healed surgical scar. No evidence of any palpable masses. No evidence of axillary adenopathy. No evidence of any palpable masses or lumps in the left breast. No evidence of leftt axillary adenopathy   CMP Latest Ref Rng & Units 08/20/2021  Glucose 70 - 99 mg/dL 112(H)  BUN 6 - 20 mg/dL 15  Creatinine 0.44 - 1.00 mg/dL 0.95  Sodium 135 - 145 mmol/L 136  Potassium 3.5 - 5.1 mmol/L 3.8  Chloride 98 - 111 mmol/L 103  CO2 22 - 32 mmol/L 23  Calcium 8.9 - 10.3  mg/dL 9.4  Total Protein 6.5 - 8.1 g/dL 8.0  Total Bilirubin 0.3 - 1.2 mg/dL 1.0  Alkaline Phos 38 - 126 U/L 71  AST 15 - 41 U/L 26  ALT 0 - 44 U/L 25   CBC Latest Ref Rng & Units 08/20/2021  WBC 4.0 - 10.5  K/uL 4.9  Hemoglobin 12.0 - 15.0 g/dL 13.3  Hematocrit 36.0 - 46.0 % 37.7  Platelets 150 - 400 K/uL 136(L)    Assessment and plan- Patient is a 46 y.o. female with pathological prognostic stage Ia invasive mammary carcinoma of the right breast MPT [id number] ER/PR positive HER2 negative.  She did not require adjuvant chemotherapy.  She completed adjuvant radiation treatment and is currently on letrozole.  This is a routine follow-up visit  Clinically patient is doing well with no concerning signs and symptoms of recurrence based on today's exam.  Patient will continue to take letrozole for 10 years.  Hormone levels were checked today and they were postmenopausal.  Clinically patient has not had any menstrual cycles for the last 3 years.She would be due for a mammogram in May 2022 which will be coordinated by surgery.  Her baseline bone density scan was normal.  I will see her back in 6 months   Visit Diagnosis 1. Encounter for follow-up surveillance of breast cancer   2. Use of letrozole (Femara)      Dr. Randa Evens, MD, MPH [facility] at [facility] [phone] 08/22/2021 10:07 AM

## 2021-08-22 NOTE — Telephone Encounter (Signed)
I sent her your message

## 2021-09-12 DIAGNOSIS — G629 Polyneuropathy, unspecified: Secondary | ICD-10-CM | POA: Diagnosis not present

## 2021-09-12 DIAGNOSIS — G609 Hereditary and idiopathic neuropathy, unspecified: Secondary | ICD-10-CM | POA: Diagnosis not present

## 2021-09-12 DIAGNOSIS — G608 Other hereditary and idiopathic neuropathies: Secondary | ICD-10-CM | POA: Diagnosis not present

## 2021-10-01 ENCOUNTER — Other Ambulatory Visit: Payer: Self-pay | Admitting: Family Medicine

## 2021-10-01 DIAGNOSIS — G629 Polyneuropathy, unspecified: Secondary | ICD-10-CM

## 2021-11-02 ENCOUNTER — Other Ambulatory Visit: Payer: Self-pay | Admitting: Oncology

## 2021-11-11 ENCOUNTER — Other Ambulatory Visit: Payer: Self-pay | Admitting: General Surgery

## 2021-11-11 DIAGNOSIS — C50911 Malignant neoplasm of unspecified site of right female breast: Secondary | ICD-10-CM

## 2021-12-02 ENCOUNTER — Ambulatory Visit (INDEPENDENT_AMBULATORY_CARE_PROVIDER_SITE_OTHER): Payer: BC Managed Care – PPO | Admitting: Family Medicine

## 2021-12-02 VITALS — BP 100/70 | HR 69 | Temp 98.3°F | Ht 65.25 in | Wt 198.5 lb

## 2021-12-02 DIAGNOSIS — R7309 Other abnormal glucose: Secondary | ICD-10-CM

## 2021-12-02 DIAGNOSIS — E538 Deficiency of other specified B group vitamins: Secondary | ICD-10-CM

## 2021-12-02 DIAGNOSIS — Z0000 Encounter for general adult medical examination without abnormal findings: Secondary | ICD-10-CM | POA: Diagnosis not present

## 2021-12-02 DIAGNOSIS — E519 Thiamine deficiency, unspecified: Secondary | ICD-10-CM

## 2021-12-02 DIAGNOSIS — Z170 Estrogen receptor positive status [ER+]: Secondary | ICD-10-CM

## 2021-12-02 DIAGNOSIS — E531 Pyridoxine deficiency: Secondary | ICD-10-CM

## 2021-12-02 DIAGNOSIS — Z23 Encounter for immunization: Secondary | ICD-10-CM | POA: Diagnosis not present

## 2021-12-02 DIAGNOSIS — R062 Wheezing: Secondary | ICD-10-CM

## 2021-12-02 DIAGNOSIS — F331 Major depressive disorder, recurrent, moderate: Secondary | ICD-10-CM

## 2021-12-02 DIAGNOSIS — C50911 Malignant neoplasm of unspecified site of right female breast: Secondary | ICD-10-CM | POA: Diagnosis not present

## 2021-12-02 DIAGNOSIS — F411 Generalized anxiety disorder: Secondary | ICD-10-CM | POA: Diagnosis not present

## 2021-12-02 DIAGNOSIS — Z13220 Encounter for screening for lipoid disorders: Secondary | ICD-10-CM

## 2021-12-02 MED ORDER — ALPRAZOLAM 0.5 MG PO TABS: 0.5000 mg | ORAL_TABLET | Freq: Every day | ORAL | 0 refills | Status: DC | PRN

## 2021-12-02 MED ORDER — ESCITALOPRAM OXALATE 20 MG PO TABS: 20.0000 mg | ORAL_TABLET | Freq: Every day | ORAL | 3 refills | Status: DC

## 2021-12-02 MED ORDER — BUPROPION HCL ER (SR) 150 MG PO TB12: ORAL_TABLET | ORAL | 1 refills | Status: DC

## 2021-12-02 NOTE — Progress Notes (Signed)
Annual Exam  ? ?Chief Complaint:  ?Chief Complaint  ?Patient presents with  ? Annual Exam  ?  No concerns.  ?Reports having last PAP about 3 years ago. Will request records from prev PCP.  ?Will req. Colonoscopy as well   ? ? ?History of Present Illness:  ?Ms. Morgan Sanders is a 46 y.o. No obstetric history on file. who LMP was No LMP recorded. Patient is postmenopausal., presents today for her annual examination.   ? ? ?Nutrition ?Diet: low sugar ?Exercise: walking as much as she can ?She does get adequate calcium and Vitamin D in her diet. ? ? ?Social History  ? ?Tobacco Use  ?Smoking Status Former  ? Packs/day: 1.00  ? Years: 20.00  ? Pack years: 20.00  ? Types: Cigarettes  ? Quit date: 2020  ? Years since quitting: 3.2  ?Smokeless Tobacco Never  ? ?Social History  ? ?Substance and Sexual Activity  ?Alcohol Use Yes  ? Comment: a few times a year  ? ?Social History  ? ?Substance and Sexual Activity  ?Drug Use Never  ? ? ?Safety ?The patient wears seatbelts: yes.     ?The patient feels safe at home and in their relationships: yes. ? ?General Health ?Dentist in the last year: Yes ?Eye doctor: yes ? ?Menstrual ?Post-menopausal - no issues ? ?GYN ?She is not sexually active.  ?Low sex drive ? ? ?Cervical Cancer Screening:   ?Last Pap: requesting records ? ? ?Breast Cancer Screening ?Pt currently getting Breast cancer treatment ?Cont with oncology f/u ? ?Colon Cancer Screening:  ?Age 69-48 yo - benefits outweigh the risk. Adults 28-85 yo who have never been screened benefit.  ?Benefits: 134000 people in 2016 will be diagnosed and 49,000 will die - early detection helps ?[place]ms: Complications 2/2 to colonoscopy ?High Risk (Colonoscopy): genetic disorder (Lynch syndrome or familial adenomatous polyposis), personal hx of IBD, previous adenomatous polyp, or previous colorectal cancer, FamHx start 10 years before the age at diagnosis, increased in males and black race ? ?Options:  ?FIT - looks for hemoglobin (blood in the  stool) - specific and fairly sensitive - must be done annually ?Cologuard - looks for DNA and blood - more sensitive - therefore can have more false positives, every 3 years ?Colonoscopy - every 10 years if normal - sedation, bowl prep, must have someone drive you ? ?Shared decision making and the patient had decided to do thinks about 4 years ago - will request. ? ?Weight ?Wt Readings from Last 3 Encounters:  ?12/02/21 198 lb 8 oz (90 kg)  ?08/20/21 195 lb (88.5 kg)  ?06/17/21 191 lb (86.6 kg)  ? ?Patient has high BMI  ?BMI Readings from Last 1 Encounters:  ?12/02/21 32.78 kg/m?  ? ? ? ?Chronic disease screening ?Blood pressure monitoring:  ?BP Readings from Last 3 Encounters:  ?12/02/21 100/70  ?08/20/21 136/89  ?06/17/21 134/86  ? ? ?Lipid Monitoring: Indication for screening: age >84, obesity, diabetes, family hx, CV risk factors.  ?Lipid screening: Yes ? ?Lab Results  ?Component Value Date  ? CHOL 247 (H) 06/28/2020  ? HDL 44.90 06/28/2020  ? LDLDIRECT 113.0 06/28/2020  ? TRIG (H) 06/28/2020  ?  487.0 Triglyceride is over 400; calculations on Lipids are invalid.  ? CHOLHDL 5 06/28/2020  ? ? ? ?Diabetes Screening: age >98, overweight, family hx, PCOS, hx of gestational diabetes, at risk ethnicity ?Diabetes Screening screening: Yes ? ?No results found for: HGBA1C ? ? ?Past Medical History:  ?Diagnosis Date  ?  Allergy   ? Anemia   ? Asthma   ? Blood transfusion without reported diagnosis   ? Breast cancer ([place])   ? Chicken pox   ? Colon polyps   ? Depression   ? Heart murmur   ? Hypertension   ? Neuromuscular disorder ([place])   ? neuropathy  ? Pneumonia   ? Stomach ulcer   ? ? ?Past Surgical History:  ?Procedure Laterality Date  ? BREAST BIOPSY Right 01/01/2021  ? u/s bx 12:00 1 cmfn path pending x clip lateral  ? BREAST BIOPSY Right 01/01/2021  ? u/s bx 12:00 1cmfn path pending vision medial  ? BREAST LUMPECTOMY WITH SENTINEL LYMPH NODE BIOPSY Right 01/18/2021  ? Procedure: BREAST LUMPECTOMY WITH SENTINEL LYMPH NODE  BX;  Surgeon: Robert Bellow, MD;  Location: [facility] ORS;  Service: General;  Laterality: Right;  ? CHOLECYSTECTOMY    ? CHOLECYSTECTOMY, LAPAROSCOPIC  03/1996  ? COLONOSCOPY    ? ESOPHAGOGASTRODUODENOSCOPY (EGD) WITH PROPOFOL N/A 12/31/2018  ? Procedure: ESOPHAGOGASTRODUODENOSCOPY (EGD) WITH PROPOFOL;  Surgeon: Lucilla Lame, MD;  Location: [facility] ENDOSCOPY;  Service: Endoscopy;  Laterality: N/A;  ? TUBAL LIGATION    ? WISDOM TOOTH EXTRACTION    ? ? ?Prior to Admission medications   ?Medication Sig Start Date End Date Taking? Authorizing Provider  ?albuterol (VENTOLIN HFA) 108 (90 Base) MCG/ACT inhaler Inhale 2 puffs into the lungs every 4 (four) hours as needed for shortness of breath or wheezing. 06/17/21  Yes Lesleigh Noe, MD  ?ALPRAZolam Duanne Moron) 0.5 MG tablet Take 1 tablet (0.5 mg total) by mouth daily as needed for anxiety. TAKE 1 TABLET BY MOUTH AS NEEDED FOR ANXIETY. 06/17/21  Yes Lesleigh Noe, MD  ?Biotin w/ Vitamins C & E (HAIR/SKIN/NAILS PO) Take 1 tablet by mouth daily.   Yes [provider]  ?Cholecalciferol (VITAMIN D3) 125 MCG (5000 UT) CAPS Take 5,000 Units by mouth daily.   Yes [provider]  ?escitalopram (LEXAPRO) 20 MG tablet TAKE 1 TABLET BY MOUTH EVERY DAY ?Patient taking differently: Take 20 mg by mouth daily. 12/12/20  Yes Lesleigh Noe, MD  ?famotidine (PEPCID) 20 MG tablet Take 20 mg by mouth 2 (two) times daily.   Yes [provider]  ?letrozole (FEMARA) 2.5 MG tablet TAKE 1 TABLET BY MOUTH EVERY DAY 11/04/21  Yes Sindy Guadeloupe, MD  ?loratadine (CLARITIN) 10 MG tablet Take 10 mg by mouth daily as needed.   Yes [provider]  ?MAG-G 500 (27 Mg) MG TABS TAKE 1 TABLET ('500MG'$ ) BY MOUTH EVERY DAY ?Patient taking differently: Take 500 mg by mouth daily. 02/07/20  Yes Lesleigh Noe, MD  ?Potassium Chloride ER 20 MEQ TBCR TAKE 1 TABLET BY MOUTH EVERY DAY 10/02/21  Yes Lesleigh Noe, MD  ?pregabalin (LYRICA) 200 MG capsule Take 200 mg by mouth 2 (two)  times daily.   Yes [provider]  ?pyridOXINE (VITAMIN B-6) 100 MG tablet Take 100 mg by mouth daily.   Yes [provider]  ?thiamine (VITAMIN B-1) 100 MG tablet Take 250 mg by mouth daily.    Yes [provider]  ?vitamin B-12 (CYANOCOBALAMIN) 500 MCG tablet Take 2,500 mcg by mouth daily.   Yes [provider]  ? ? ?No Known Allergies ? ?Gynecologic History: No LMP recorded. Patient is postmenopausal. ? ?Obstetric History: No obstetric history on file. ? ?Social History  ? ?Socioeconomic History  ? Marital status: Married  ?  Spouse name: [name]  ?  Number of children: 2  ? Years of education: high school  ? Highest education level: 12th grade  ?Occupational History  ? Occupation: Golf/clubhouse attendent  ?Tobacco Use  ? Smoking status: Former  ?  Packs/day: 1.00  ?  Years: 20.00  ?  Pack years: 20.00  ?  Types: Cigarettes  ?  Quit date: 2020  ?  Years since quitting: 3.2  ? Smokeless tobacco: Never  ?Vaping Use  ? Vaping Use: Some days  ? Substances: Nicotine, Flavoring  ?Substance and Sexual Activity  ? Alcohol use: Yes  ?  Comment: a few times a year  ? Drug use: Never  ? Sexual activity: Not Currently  ?Other Topics Concern  ? Not on file  ?Social History Narrative  ? 02/07/20  ? From: the area  ? Living: with husband, [name] (2000)  ? Work: at a golf course  ?   ? Family: daughters - CJ (lives at [employer], 2 children) and Marolyn Hammock (still living at home, in law school)  ?   ? Enjoys: golf   ?   ? Exercise: golfing 3-4 times a week - alternating between cart/walking  ? Diet: pretty good - eggs/fruit, eats lunch and dinner  ?   ? Safety  ? Seat belts: Yes   ? Guns: Yes  and secure  ? Safe in relationships: Yes   ? ?Social Determinants of Health  ? ?Financial Resource Strain: Not on file  ?Food Insecurity: Not on file  ?Transportation Needs: Not on file  ?Physical Activity: Not on file  ?Stress: Not on file  ?Social Connections: Not on file  ?Intimate Partner Violence: Not on  file  ? ? ?Family History  ?Problem Relation Age of Onset  ? Breast cancer Maternal Grandmother 61  ? Arthritis Maternal Grandmother   ? Cancer Maternal Grandmother   ? Diabetes Maternal Grandmother   ?

## 2021-12-02 NOTE — Patient Instructions (Addendum)
We will get records, if not plan to get pap next year ? ?If you are still needing inhaler daily in 2 weeks - call or mychart and will plan for x-ray and pulmonology ? ?Start Wellbutrin ?- morning x 3 days ?- then twice daily ? ?Monitor for worsening anxiety or improvement in libido, weight loss ?

## 2021-12-02 NOTE — Assessment & Plan Note (Signed)
Stable, however patient with low libido.  Discussed this could be due to the Lexapro.  Discussed starting Wellbutrin 150 mg with plan to increase to twice daily.  If tolerating may consider reducing or taking off Lexapro at follow-up visit. ?

## 2021-12-02 NOTE — Assessment & Plan Note (Signed)
Stable. Has 1 year follow-up soon. Cont Femara 2.5 mg ?

## 2021-12-02 NOTE — Assessment & Plan Note (Signed)
Has been doing okay, however anniversary of her mother's death as well as her breast cancer diagnosis are coming up in the next couple of months.  Xanax No. 30 last sent in October reasonable for refill today.  Continue Lexapro, starting Wellbutrin as planned per depression. ?

## 2021-12-02 NOTE — Assessment & Plan Note (Signed)
Wheezing on exam, patient notes worse since COVID and in setting of allergy season.  Continue albuterol as needed, however if still using daily in 2 weeks she will update.  At that time plan for chest x-ray and pulmonology referral ?

## 2021-12-03 ENCOUNTER — Encounter: Payer: Self-pay | Admitting: Family Medicine

## 2021-12-03 LAB — LIPID PANEL
Cholesterol: 257 mg/dL — ABNORMAL HIGH (ref 0–200)
HDL: 43 mg/dL (ref >39.00)
Total CHOL/HDL Ratio: 6
Triglycerides: 619 mg/dL — ABNORMAL HIGH (ref 0.0–149.0)

## 2021-12-03 LAB — VITAMIN B12: Vitamin B-12: >1504 pg/mL — ABNORMAL HIGH (ref 211–911)

## 2021-12-03 LAB — LDL CHOLESTEROL, DIRECT: Direct LDL: 115 mg/dL

## 2021-12-03 LAB — HEMOGLOBIN A1C: Hgb A1c MFr Bld: 5.4 % (ref 4.6–6.5)

## 2021-12-06 ENCOUNTER — Encounter: Payer: Self-pay | Admitting: Family Medicine

## 2021-12-09 LAB — VITAMIN B1: Vitamin B1 (Thiamine): 123 nmol/L — ABNORMAL HIGH (ref 8–30)

## 2021-12-09 LAB — VITAMIN B6: Vitamin B6: 117.2 ng/mL — ABNORMAL HIGH (ref 2.1–21.7)

## 2021-12-13 ENCOUNTER — Ambulatory Visit: Payer: BC Managed Care – PPO | Admitting: Radiation Oncology

## 2021-12-16 ENCOUNTER — Ambulatory Visit
Admission: RE | Admit: 2021-12-16 | Discharge: 2021-12-16 | Disposition: A | Discharge status: Home / Self Care | Payer: BC Managed Care – PPO | Source: Ambulatory Visit | Attending: Emergency Medicine | Admitting: Emergency Medicine

## 2021-12-16 ENCOUNTER — Telehealth: Payer: Self-pay | Admitting: Family Medicine

## 2021-12-16 ENCOUNTER — Ambulatory Visit (INDEPENDENT_AMBULATORY_CARE_PROVIDER_SITE_OTHER): Payer: BC Managed Care – PPO

## 2021-12-16 VITALS — BP 159/95 | HR 85 | Temp 98.4°F | Resp 18

## 2021-12-16 DIAGNOSIS — J209 Acute bronchitis, unspecified: Secondary | ICD-10-CM | POA: Diagnosis not present

## 2021-12-16 DIAGNOSIS — R030 Elevated blood-pressure reading, without diagnosis of hypertension: Secondary | ICD-10-CM | POA: Diagnosis not present

## 2021-12-16 DIAGNOSIS — J45901 Unspecified asthma with (acute) exacerbation: Secondary | ICD-10-CM | POA: Diagnosis not present

## 2021-12-16 DIAGNOSIS — R059 Cough, unspecified: Secondary | ICD-10-CM

## 2021-12-16 MED ORDER — ALBUTEROL SULFATE HFA 108 (90 BASE) MCG/ACT IN AERS: 1.0000 | INHALATION_SPRAY | Freq: Four times a day (QID) | RESPIRATORY_TRACT | 0 refills | Status: DC | PRN

## 2021-12-16 MED ORDER — ALBUTEROL SULFATE (2.5 MG/3ML) 0.083% IN NEBU
2.5000 mg | INHALATION_SOLUTION | Freq: Once | RESPIRATORY_TRACT | Status: AC
  Administered 2021-12-16: 2.5 mg via RESPIRATORY_TRACT

## 2021-12-16 MED ORDER — AZITHROMYCIN 250 MG PO TABS: 250.0000 mg | ORAL_TABLET | Freq: Every day | ORAL | 0 refills | Status: DC

## 2021-12-16 MED ORDER — PREDNISONE 10 MG PO TABS: 40.0000 mg | ORAL_TABLET | Freq: Every day | ORAL | 0 refills | Status: AC

## 2021-12-16 NOTE — Telephone Encounter (Signed)
Pt called about a chest x-ray that Dr. Einar Pheasant suggested for her. She just wants to speak with the nurse about this. ? ?Callback Number: [phone] ?

## 2021-12-16 NOTE — ED Triage Notes (Signed)
Pt presents with a cough for several months and was seen by her PCP on 4/17. She was advised to call their office if she wasn't better to get a chest xray. There were no appts available today so she was advised to visit the urgent care.  ?

## 2021-12-16 NOTE — ED Provider Notes (Signed)
?[facility] ? ? ? ?CSN: [id number] ?Arrival date & time: 12/16/21  1738 ? ? ?  ? ?History   ?Chief Complaint ?Chief Complaint  ?Patient presents with  ? Cough  ?  Entered by patient  ? ? ?HPI ?Morgan Sanders is a 46 y.o. female.  Patient presents with cough intermittently for several months; worse x2 weeks.  She also has wheezing.  She has been using her albuterol inhaler as well as OTC allergy medications.  Patient is unsure if her albuterol inhaler has expired or has refills; she requests an albuterol inhaler refill today.  No fever, rash, sore throat, shortness of breath, chest pain, or other symptoms.  She contacted her PCP and was directed to come here for chest x-ray.  Her medical history includes asthma, GI bleed, peptic ulcer, B12 deficiency, polyneuropathy, fatty liver, cirrhosis, chronic low back pain, DDD, depression, anxiety, breast cancer. ? ?The history is provided by the patient and medical records.  ? ?Past Medical History:  ?Diagnosis Date  ? Allergy   ? Anemia   ? Asthma   ? Blood transfusion without reported diagnosis   ? Breast cancer ([place])   ? Chicken pox   ? Colon polyps   ? Depression   ? Heart murmur   ? Hypertension   ? Neuromuscular disorder ([place])   ? neuropathy  ? Pneumonia   ? Stomach ulcer   ? ? ?Patient Active Problem List  ? Diagnosis Date Noted  ? Wheezing 12/02/2021  ? Acute pain of both shoulders 06/17/2021  ? Malignant neoplasm of right breast, stage 1, estrogen receptor positive ([place]) 01/07/2021  ? Alcoholic cirrhosis of liver with ascites ([place]) 06/28/2020  ? Allergies 02/07/2020  ? Moderate episode of recurrent major depressive disorder ([place]) 02/07/2020  ? Skull defect 02/07/2020  ? Generalized anxiety disorder 02/07/2020  ? B12 deficiency 01/21/2019  ? Polyneuropathy 01/21/2019  ? Nonalcoholic fatty liver disease 01/21/2019  ? Degenerative disc disease, lumbar 01/21/2019  ? Chronic low back pain 01/21/2019  ? Anemia   ? Intractable vomiting   ? Acute peptic ulcer of  stomach   ? GIB (gastrointestinal bleeding) 12/30/2018  ? Carpal tunnel syndrome on right 03/04/2018  ? ? ?Past Surgical History:  ?Procedure Laterality Date  ? BREAST BIOPSY Right 01/01/2021  ? u/s bx 12:00 1 cmfn path pending x clip lateral  ? BREAST BIOPSY Right 01/01/2021  ? u/s bx 12:00 1cmfn path pending vision medial  ? BREAST LUMPECTOMY WITH SENTINEL LYMPH NODE BIOPSY Right 01/18/2021  ? Procedure: BREAST LUMPECTOMY WITH SENTINEL LYMPH NODE BX;  Surgeon: Robert Bellow, MD;  Location: [facility] ORS;  Service: General;  Laterality: Right;  ? CHOLECYSTECTOMY    ? CHOLECYSTECTOMY, LAPAROSCOPIC  03/1996  ? COLONOSCOPY    ? ESOPHAGOGASTRODUODENOSCOPY (EGD) WITH PROPOFOL N/A 12/31/2018  ? Procedure: ESOPHAGOGASTRODUODENOSCOPY (EGD) WITH PROPOFOL;  Surgeon: Lucilla Lame, MD;  Location: [facility] ENDOSCOPY;  Service: Endoscopy;  Laterality: N/A;  ? TUBAL LIGATION    ? WISDOM TOOTH EXTRACTION    ? ? ?OB History   ?No obstetric history on file. ?  ? ? ? ?Home Medications   ? ?Prior to Admission medications   ?Medication Sig Start Date End Date Taking? Authorizing Provider  ?albuterol (VENTOLIN HFA) 108 (90 Base) MCG/ACT inhaler Inhale 1-2 puffs into the lungs every 6 (six) hours as needed for wheezing or shortness of breath. 12/16/21  Yes Sharion Balloon, NP  ?azithromycin (ZITHROMAX) 250 MG tablet Take 1 tablet (250 mg  total) by mouth daily. Take first 2 tablets together, then 1 every day until finished. 12/16/21  Yes Sharion Balloon, NP  ?predniSONE (DELTASONE) 10 MG tablet Take 4 tablets (40 mg total) by mouth daily for 5 days. 12/16/21 12/21/21 Yes Sharion Balloon, NP  ?albuterol (VENTOLIN HFA) 108 (90 Base) MCG/ACT inhaler Inhale 2 puffs into the lungs every 4 (four) hours as needed for shortness of breath or wheezing. 06/17/21   Lesleigh Noe, MD  ?ALPRAZolam Duanne Moron) 0.5 MG tablet Take 1 tablet (0.5 mg total) by mouth daily as needed for anxiety. TAKE 1 TABLET BY MOUTH AS NEEDED FOR ANXIETY. 12/02/21   Lesleigh Noe, MD  ?Biotin w/  Vitamins C & E (HAIR/SKIN/NAILS PO) Take 1 tablet by mouth daily.    [provider]  ?buPROPion (WELLBUTRIN SR) 150 MG 12 hr tablet Take once daily in the morning for 3 days. Then increase to twice daily. 12/02/21   Lesleigh Noe, MD  ?Cholecalciferol (VITAMIN D3) 125 MCG (5000 UT) CAPS Take 5,000 Units by mouth daily.    [provider]  ?escitalopram (LEXAPRO) 20 MG tablet Take 1 tablet (20 mg total) by mouth daily. 12/02/21   Lesleigh Noe, MD  ?famotidine (PEPCID) 20 MG tablet Take 20 mg by mouth 2 (two) times daily.    [provider]  ?letrozole (FEMARA) 2.5 MG tablet TAKE 1 TABLET BY MOUTH EVERY DAY 11/04/21   Sindy Guadeloupe, MD  ?loratadine (CLARITIN) 10 MG tablet Take 10 mg by mouth daily as needed.    [provider]  ?MAG-G 500 (27 Mg) MG TABS TAKE 1 TABLET ('500MG'$ ) BY MOUTH EVERY DAY ?Patient taking differently: Take 500 mg by mouth daily. 02/07/20   Lesleigh Noe, MD  ?Potassium Chloride ER 20 MEQ TBCR TAKE 1 TABLET BY MOUTH EVERY DAY 10/02/21   Lesleigh Noe, MD  ?pregabalin (LYRICA) 200 MG capsule Take 200 mg by mouth 2 (two) times daily.    [provider]  ?pyridOXINE (VITAMIN B-6) 100 MG tablet Take 100 mg by mouth daily.    [provider]  ?thiamine (VITAMIN B-1) 100 MG tablet Take 250 mg by mouth daily.     [provider]  ?vitamin B-12 (CYANOCOBALAMIN) 500 MCG tablet Take 2,500 mcg by mouth daily.    [provider]  ? ? ?Family History ?Family History  ?Problem Relation Age of Onset  ? Breast cancer Maternal Grandmother 52  ? Arthritis Maternal Grandmother   ? Cancer Maternal Grandmother   ? Diabetes Maternal Grandmother   ? Hearing loss Maternal Grandmother   ? Hypertension Maternal Grandmother   ? Bone cancer Maternal Great-grandmother   ? Hypertension Mother   ? Diabetes Mother   ? Hyperlipidemia Mother   ? Kidney disease Mother   ? Arthritis Mother   ? Asthma Mother   ? Depression Mother   ? Sudden death Father   ?      hit by train  ? Liver disease Father   ?     hx of liver transplant  ? Early death Father   ? Cataracts Paternal Grandfather   ? Lung cancer Paternal Grandfather   ? Hyperlipidemia Sister   ? Hypertension Sister   ? Depression Daughter   ? Early death Maternal Grandfather   ? Depression Daughter   ? ? ?Social History ?Social History  ? ?Tobacco Use  ? Smoking status: Former  ?  Packs/day: 1.00  ?  Years:  20.00  ?  Pack years: 20.00  ?  Types: Cigarettes  ?  Quit date: 2020  ?  Years since quitting: 3.3  ? Smokeless tobacco: Never  ?Vaping Use  ? Vaping Use: Some days  ? Substances: Nicotine, Flavoring  ?Substance Use Topics  ? Alcohol use: Yes  ?  Comment: a few times a year  ? Drug use: Never  ? ? ? ?Allergies   ?Patient has no known allergies. ? ? ?Review of Systems ?Review of Systems  ?Constitutional:  Negative for chills and fever.  ?HENT:  Negative for ear pain and sore throat.   ?Respiratory:  Positive for cough and wheezing. Negative for shortness of breath.   ?Cardiovascular:  Negative for chest pain and palpitations.  ?Gastrointestinal:  Negative for diarrhea and vomiting.  ?Skin:  Negative for color change and rash.  ?All other systems reviewed and are negative. ? ? ?Physical Exam ?Triage Vital Signs ?ED Triage Vitals  ?Enc Vitals Group  ?   BP 12/16/21 1755 (!) 159/95  ?   Pulse Rate 12/16/21 1751 85  ?   Resp 12/16/21 1751 18  ?   Temp 12/16/21 1751 98.4 ?F (36.9 ?C)  ?   Temp src --   ?   SpO2 12/16/21 1751 96 %  ?   Weight --   ?   Height --   ?   Head Circumference --   ?   Peak Flow --   ?   Pain Score 12/16/21 1754 0  ?   Pain Loc --   ?   Pain Edu? --   ?   Excl. in [place]? --   ? ?No data found. ? ?Updated Vital Signs ?BP (!) 159/95 (BP Location: Left Arm)   Pulse 85   Temp 98.4 ?F (36.9 ?C)   Resp 18   SpO2 96%  ? ?Visual Acuity ?Right Eye Distance:   ?Left Eye Distance:   ?Bilateral Distance:   ? ?Right Eye Near:   ?Left Eye Near:    ?Bilateral Near:    ? ?Physical Exam ?Vitals and nursing  note reviewed.  ?Constitutional:   ?   General: She is not in acute distress. ?   Appearance: Normal appearance. She is well-developed. She is not ill-appearing.  ?HENT:  ?   Right Ear: Tympanic membrane no

## 2021-12-16 NOTE — Discharge Instructions (Addendum)
Use the albuterol inhaler as directed.  Take the prednisone and Zithromax as directed.  Follow up with your primary care provider. ? ?Your blood pressure is elevated today at 159/95.  Please have this rechecked by your primary care provider in 2-4 weeks.     ? ?

## 2021-12-16 NOTE — Telephone Encounter (Signed)
11:54 AM ?You contacted Wynona Dove"   ?Eugenia Pancoast, FNP ?to Loreen Freud, CMA  Me   ?  11:43 AM ? Mearl Latin can you please triage patient?  ?As wheezing has worsening I feel pt needs to be seen more urgently rather than just a CXR ordered. Thank you .  ? ? ?I spoke with pt and she is presently at work; pt does not think she can leave work; pt said she does have a dry cough with some wheezing but pt does not have CP or SOB and pt does not feel she is in any distress with her breathing at this time and does not think she needs to go to Ed. I scheduled appt for pt at [place] 12/16/21 at 6 PM. UC & ED precautions given and pt voiced understanding. Will send note to T Dugal FNP and [facility] CMA. ?

## 2021-12-16 NOTE — Telephone Encounter (Signed)
Wheezing   ?    Wheezing on exam, patient notes worse since COVID and in setting of allergy season.  Continue albuterol as needed, however if still using daily in 2 weeks she will update.  At that time plan for chest x-ray and pulmonology referral ?   ?  ?Office note from 12/02/21 above. ? ?Pt states, today, that wheezing is getting worse over the last few days and still using albuterol. Pt would like chest xray order placed and she will come here to the office.  ?

## 2021-12-23 ENCOUNTER — Ambulatory Visit
Admission: RE | Admit: 2021-12-23 | Discharge: 2021-12-23 | Disposition: A | Discharge status: Home / Self Care | Payer: BC Managed Care – PPO | Source: Ambulatory Visit | Attending: General Surgery | Admitting: General Surgery

## 2021-12-23 DIAGNOSIS — Z170 Estrogen receptor positive status [ER+]: Secondary | ICD-10-CM | POA: Diagnosis not present

## 2021-12-23 DIAGNOSIS — C50911 Malignant neoplasm of unspecified site of right female breast: Secondary | ICD-10-CM | POA: Diagnosis not present

## 2021-12-23 DIAGNOSIS — Z853 Personal history of malignant neoplasm of breast: Secondary | ICD-10-CM | POA: Diagnosis not present

## 2021-12-25 ENCOUNTER — Other Ambulatory Visit: Payer: Self-pay | Admitting: Family Medicine

## 2022-01-20 ENCOUNTER — Ambulatory Visit (INDEPENDENT_AMBULATORY_CARE_PROVIDER_SITE_OTHER): Payer: BC Managed Care – PPO | Admitting: Family Medicine

## 2022-01-20 VITALS — BP 120/80 | HR 76 | Temp 97.6°F | Ht 65.0 in | Wt 195.1 lb

## 2022-01-20 DIAGNOSIS — F331 Major depressive disorder, recurrent, moderate: Secondary | ICD-10-CM

## 2022-01-20 DIAGNOSIS — J4520 Mild intermittent asthma, uncomplicated: Secondary | ICD-10-CM

## 2022-01-20 DIAGNOSIS — F411 Generalized anxiety disorder: Secondary | ICD-10-CM | POA: Diagnosis not present

## 2022-01-20 DIAGNOSIS — T7840XD Allergy, unspecified, subsequent encounter: Secondary | ICD-10-CM | POA: Diagnosis not present

## 2022-01-20 MED ORDER — MONTELUKAST SODIUM 10 MG PO TABS: 10.0000 mg | ORAL_TABLET | Freq: Every day | ORAL | 1 refills | Status: DC

## 2022-01-20 NOTE — Progress Notes (Signed)
Subjective:     Morgan Sanders is a 46 y.o. female presenting for Follow-up (wellbutrin)     HPI  #Depression/anxiety - some improvement in libido - anxiety and depression doing well - no side effects with wellbutrin   #seasonal allergies - still having watery eyes - try xyzal  - getting significantly eyes  - told she does not have dry eyes - by eye specialist -    Review of Systems 12/02/2021: Clinic - Start wellbutrin for low libido, consider stopping lexapro. prn xanax  Social History   Tobacco Use  Smoking Status Former   Packs/day: 1.00   Years: 20.00   Pack years: 20.00   Types: Cigarettes   Quit date: 2020   Years since quitting: 3.4  Smokeless Tobacco Never        Objective:    BP Readings from Last 3 Encounters:  01/20/22 120/80  12/16/21 (!) 159/95  12/02/21 100/70   Wt Readings from Last 3 Encounters:  01/20/22 195 lb 2 oz (88.5 kg)  12/02/21 198 lb 8 oz (90 kg)  08/20/21 195 lb (88.5 kg)    BP 120/80   Pulse 76   Temp 97.6 F (36.4 C) (Temporal)   Ht '5\' 5"'$  (1.651 m)   Wt 195 lb 2 oz (88.5 kg)   SpO2 95%   BMI 32.47 kg/m    Physical Exam Constitutional:      General: She is not in acute distress.    Appearance: She is well-developed. She is not diaphoretic.  HENT:     Right Ear: External ear normal.     Left Ear: External ear normal.     Nose: Nose normal.  Eyes:     Conjunctiva/sclera: Conjunctivae normal.  Cardiovascular:     Rate and Rhythm: Normal rate.  Pulmonary:     Effort: Pulmonary effort is normal.  Musculoskeletal:     Cervical back: Neck supple.  Skin:    General: Skin is warm and dry.     Capillary Refill: Capillary refill takes less than 2 seconds.  Neurological:     Mental Status: She is alert. Mental status is at baseline.  Psychiatric:        Mood and Affect: Mood normal.        Behavior: Behavior normal.       01/20/2022    4:40 PM 12/02/2021    4:18 PM 06/17/2021    4:14 PM  Depression screen  PHQ 2/9  Decreased Interest 0 0 0  Down, Depressed, Hopeless 0 0 0  PHQ - 2 Score 0 0 0  Altered sleeping 0 1 0  Tired, decreased energy '1 1 1  '$ Change in appetite 0 0 0  Feeling bad or failure about yourself  0 0 0  Trouble concentrating 0 0 1  Moving slowly or fidgety/restless 0 0 0  Suicidal thoughts 0 0 0  PHQ-9 Score '1 2 2  '$ Difficult doing work/chores Not difficult at all Not difficult at all Not difficult at all      12/02/2021    4:19 PM 06/17/2021    4:15 PM 02/07/2020   11:56 AM  GAD 7 : Generalized Anxiety Score  Nervous, Anxious, on Edge 1 0 1  Control/stop worrying 0 0 1  Worry too much - different things 0 1 1  Trouble relaxing 0 1 1  Restless 0 1 0  Easily annoyed or irritable 0 0 0  Afraid - awful might happen 0 0 0  Total GAD 7 Score '1 3 4  '$ Anxiety Difficulty Not difficult at all Not difficult at all Not difficult at all          Assessment & Plan:   Problem List Items Addressed This Visit       Other   Allergies - Primary    Persistent watery eyes with drops and OTC 24 hour allergy medication. Will trial singulair  - discussed risk of psychiatric side effects.        Relevant Medications   montelukast (SINGULAIR) 10 MG tablet   Moderate episode of recurrent major depressive disorder ([place])    Controlled. Some improvement in libido. Discussed starting taper of lexapro to 10 mg daily. Cont wellbutrin 150 mg bid.        Generalized anxiety disorder    Controlled. Start taper of lexapro. Cont wellbutrin 150 mg bid.        Other Visit Diagnoses     Mild intermittent reactive airway disease without complication       Relevant Medications   montelukast (SINGULAIR) 10 MG tablet        Return if symptoms worsen or fail to improve.  Lesleigh Noe, MD

## 2022-01-20 NOTE — Assessment & Plan Note (Signed)
Persistent watery eyes with drops and OTC 24 hour allergy medication. Will trial singulair  - discussed risk of psychiatric side effects.

## 2022-01-20 NOTE — Assessment & Plan Note (Signed)
Controlled. Some improvement in libido. Discussed starting taper of lexapro to 10 mg daily. Cont wellbutrin 150 mg bid.

## 2022-01-20 NOTE — Patient Instructions (Addendum)
Plan  Week 1-2:  - Welbutrin 150 mg twice daily - Decrease Lexapro to 10 mg   Monitor for  - withdrawal symptoms - worsening anxiety/depression  If no changes and doing well  Week 3 -- try stopping lexapro -- if issues - update and can send 5 mg tablet to try    Try Singulair - update if effective

## 2022-01-20 NOTE — Assessment & Plan Note (Signed)
Controlled. Start taper of lexapro. Cont wellbutrin 150 mg bid.

## 2022-01-24 ENCOUNTER — Other Ambulatory Visit: Payer: Self-pay | Admitting: Family Medicine

## 2022-02-13 ENCOUNTER — Encounter: Payer: Self-pay | Admitting: Family Medicine

## 2022-02-13 ENCOUNTER — Other Ambulatory Visit: Payer: Self-pay | Admitting: Family Medicine

## 2022-02-13 DIAGNOSIS — T7840XD Allergy, unspecified, subsequent encounter: Secondary | ICD-10-CM

## 2022-02-13 DIAGNOSIS — J4520 Mild intermittent asthma, uncomplicated: Secondary | ICD-10-CM

## 2022-02-13 NOTE — Telephone Encounter (Signed)
Mychart message sent to pt to see if Rx is helping

## 2022-02-21 ENCOUNTER — Inpatient Hospital Stay: Payer: BC Managed Care – PPO | Attending: Oncology | Admitting: Medical Oncology

## 2022-02-21 ENCOUNTER — Telehealth: Payer: Self-pay

## 2022-02-21 VITALS — BP 125/87 | HR 75 | Temp 97.9°F | Resp 16 | Wt 193.0 lb

## 2022-02-21 DIAGNOSIS — C50911 Malignant neoplasm of unspecified site of right female breast: Secondary | ICD-10-CM | POA: Diagnosis not present

## 2022-02-21 DIAGNOSIS — Z08 Encounter for follow-up examination after completed treatment for malignant neoplasm: Secondary | ICD-10-CM | POA: Diagnosis not present

## 2022-02-21 DIAGNOSIS — Z853 Personal history of malignant neoplasm of breast: Secondary | ICD-10-CM

## 2022-02-21 DIAGNOSIS — Z87891 Personal history of nicotine dependence: Secondary | ICD-10-CM | POA: Diagnosis not present

## 2022-02-21 DIAGNOSIS — Z170 Estrogen receptor positive status [ER+]: Secondary | ICD-10-CM | POA: Insufficient documentation

## 2022-02-21 DIAGNOSIS — Z79811 Long term (current) use of aromatase inhibitors: Secondary | ICD-10-CM | POA: Diagnosis not present

## 2022-02-21 DIAGNOSIS — Z79899 Other long term (current) drug therapy: Secondary | ICD-10-CM | POA: Insufficient documentation

## 2022-02-21 DIAGNOSIS — T7840XD Allergy, unspecified, subsequent encounter: Secondary | ICD-10-CM

## 2022-02-21 DIAGNOSIS — Z923 Personal history of irradiation: Secondary | ICD-10-CM | POA: Diagnosis not present

## 2022-02-21 DIAGNOSIS — J4520 Mild intermittent asthma, uncomplicated: Secondary | ICD-10-CM

## 2022-02-21 MED ORDER — MONTELUKAST SODIUM 10 MG PO TABS: 10.0000 mg | ORAL_TABLET | Freq: Every day | ORAL | 0 refills | Status: DC

## 2022-02-21 NOTE — Progress Notes (Signed)
Returns for follow-up of breast cancer. Denies any new concerns or changes in health since last visit.

## 2022-02-21 NOTE — Progress Notes (Signed)
Hematology/Oncology Consult note [facility]  [phone] [phone]  Patient Care Team: Lesleigh Noe, MD as PCP - General (Family Medicine) Reeves Forth (Gastroenterology) Sharlet Salina, MD as Referring Physician (Physical Medicine and Rehabilitation) Maximiano Coss, Loraine Grip, MD (Neurology) Theodore Demark, RN (Inactive) as Oncology Nurse Navigator   Name of the patient: Morgan Sanders  [id number]  [birth year]   Date of visit: 02/21/22  Diagnosis- pathological prognostic stage Ia invasive mammary carcinoma of the right breast and pT1b pN1 acM0 ER/PR positive HER2 negative  Chief complaint/ Reason for visit-routine follow-up of breast cancer on letrozole  Heme/Onc history: Patient is a 46 year old post menopausal female with a prior history of normocytic anemia for which she had a complete work-up including bone marrow biopsy as well as history of cirrhosis of the liver.  She is postmenopausal and she has not had any menstrual cycles since the age of 6.  Patient underwent a bilateral screening mammogram on 12/21/2020 which showed possible mass in the right breast.  This was followed by diagnostic mammogram and ultrasound which showed 2 masses in the right breast at 12 o'clock position each measuring 6 mm and 1.2 cm apart.  Ultrasound of the right axilla was negative for malignancy.  Patient underwent biopsy of both the medial and the lateral breast mass and it was consistent with invasive mammary carcinoma grade 2.  ER greater than 90% positive, PR greater than 90% positive and HER2 negative   Patient underwent right lumpectomy with sentinel lymph node biopsy Final pathology showed 2 tumors 7 mm in size both of which were grade 1.  The tissue in between did not have any evidence of malignancy or DCIS.  She was noted to have metastatic carcinoma in 2 out of 4 sentinel lymph node 6 mm deposit with no extracapsular extension.   MammaPrint  score came back at low risk and therefore patient did not require any adjuvant chemotherapy.  Patient completed radiation treatment and startedLetrozole in August 2022  Interval history- Patient reports that she is tolerating her Letrozole well and taking this as directed. No side effects. NO breast changes, unintentional weight loss or night sweats. 09/22 Bone density (normal). 12/2021 Mammogram which was non-concerning. Has breast exam in May and defers one today. Denies any specific complaints at this time.  She is also taking her calcium and vitamin D regularly  ECOG PS- 0 Pain scale- 0   Review of systems- Review of Systems  Constitutional:  Negative for chills, fever, malaise/fatigue and weight loss.  HENT:  Negative for congestion, ear discharge and nosebleeds.   Eyes:  Negative for blurred vision.  Respiratory:  Negative for cough, hemoptysis, sputum production, shortness of breath and wheezing.   Cardiovascular:  Negative for chest pain, palpitations, orthopnea and claudication.  Gastrointestinal:  Negative for abdominal pain, blood in stool, constipation, diarrhea, heartburn, melena, nausea and vomiting.  Genitourinary:  Negative for dysuria, flank pain, frequency, hematuria and urgency.  Musculoskeletal:  Negative for back pain, joint pain and myalgias.  Skin:  Negative for rash.  Neurological:  Negative for dizziness, tingling, focal weakness, seizures, weakness and headaches.  Endo/Heme/Allergies:  Does not bruise/bleed easily.  Psychiatric/Behavioral:  Negative for depression and suicidal ideas. The patient does not have insomnia.       No Known Allergies   Past Medical History:  Diagnosis Date   Allergy    Anemia    Asthma    Blood transfusion without reported  diagnosis    Breast cancer (HCC)    Chicken pox    Colon polyps    Depression    Heart murmur    Hypertension    Neuromuscular disorder (HCC)    neuropathy   Pneumonia    Stomach ulcer      Past  Surgical History:  Procedure Laterality Date   BREAST BIOPSY Right 01/01/2021   u/s bx 12:00 1 cmfn path pending x clip lateral   BREAST BIOPSY Right 01/01/2021   u/s bx 12:00 1cmfn path pending vision medial   BREAST LUMPECTOMY     BREAST LUMPECTOMY WITH SENTINEL LYMPH NODE BIOPSY Right 01/18/2021   Procedure: BREAST LUMPECTOMY WITH SENTINEL LYMPH NODE BX;  Surgeon: Byrnett, Jeffrey W, MD;  Location: [facility];  Service: General;  Laterality: Right;   CHOLECYSTECTOMY     CHOLECYSTECTOMY, LAPAROSCOPIC  03/1996   COLONOSCOPY     ESOPHAGOGASTRODUODENOSCOPY (EGD) WITH PROPOFOL N/A 12/31/2018   Procedure: ESOPHAGOGASTRODUODENOSCOPY (EGD) WITH PROPOFOL;  Surgeon: Wohl, Darren, MD;  Location: [facility];  Service: Endoscopy;  Laterality: N/A;   TUBAL LIGATION     WISDOM TOOTH EXTRACTION      Social History   Socioeconomic History   Marital status: Married    Spouse name: Randy   Number of children: 2   Years of education: high school   Highest education level: 12th grade  Occupational History   Occupation: Golf/clubhouse attendent  Tobacco Use   Smoking status: Former    Packs/day: 1.00    Years: 20.00    Total pack years: 20.00    Types: Cigarettes    Quit date: 2020    Years since quitting: 3.5   Smokeless tobacco: Never  Vaping Use   Vaping Use: Some days   Substances: Nicotine, Flavoring  Substance and Sexual Activity   Alcohol use: Yes    Comment: a few times a year   Drug use: Never   Sexual activity: Not Currently  Other Topics Concern   Not on file  Social History Narrative   02/07/20   From: the area   Living: with husband, Randy (2000)   Work: at a golf course      Family: daughters - CJ [place], 2 children) and Katelyn (still living at home, in law school)      Enjoys: golf       Exercise: golfing 3-4 times a week - alternating between cart/walking   Diet: pretty good - eggs/fruit, eats lunch and dinner      Safety   Seat belts: Yes     Guns: Yes  and secure   Safe in relationships: Yes    Social Determinants of Health   Financial Resource Strain: Low Risk  (01/07/2019)   Overall Financial Resource Strain (CARDIA)    Difficulty of Paying Living Expenses: Not hard at all  Food Insecurity: No Food Insecurity (01/07/2019)   Hunger Vital Sign    Worried About Running Out of Food in the Last Year: Never true    Ran Out of Food in the Last Year: Never true  Transportation Needs: No Transportation Needs (01/07/2019)   PRAPARE - Transportation    Lack of Transportation (Medical): No    Lack of Transportation (Non-Medical): No  Physical Activity: Inactive (01/07/2019)   Exercise Vital Sign    Days of Exercise per Week: 0 days    Minutes of Exercise per Session: 0 min  Stress: No Stress Concern Present (01/07/2019)   [employer]   [facility] [employer]    Feeling of Stress : Not at all  Social Connections: Unknown (01/07/2019)   Social Connection and Isolation Panel [NHANES]    Frequency of Communication with Friends and Family: More than three times a week    Frequency of Social Gatherings with Friends and Family: More than three times a week    Attends Religious Services: More than 4 times per year    Active Member of [employer] or Organizations: Not on file    Attends Archivist Meetings: Not on file    Marital Status: Not on file  Intimate Partner Violence: Not At Risk (01/07/2019)   Humiliation, Afraid, Rape, and Kick questionnaire    Fear of Current or Ex-Partner: No    Emotionally Abused: No    Physically Abused: No    Sexually Abused: No    Family History  Problem Relation Age of Onset   Breast cancer Maternal Grandmother 71   Arthritis Maternal Grandmother    Cancer Maternal Grandmother    Diabetes Maternal Grandmother    Hearing loss Maternal Grandmother    Hypertension Maternal Grandmother    Bone cancer Maternal Great-grandmother    Hypertension Mother     Diabetes Mother    Hyperlipidemia Mother    Kidney disease Mother    Arthritis Mother    Asthma Mother    Depression Mother    Sudden death Father        hit by train   Liver disease Father        hx of liver transplant   Early death Father    Cataracts Paternal Grandfather    Lung cancer Paternal Grandfather    Hyperlipidemia Sister    Hypertension Sister    Depression Daughter    Early death Maternal Grandfather    Depression Daughter      Current Outpatient Medications:    albuterol (VENTOLIN HFA) 108 (90 Base) MCG/ACT inhaler, Inhale 2 puffs into the lungs every 4 (four) hours as needed for shortness of breath or wheezing., Disp: 18 g, Rfl: 2   albuterol (VENTOLIN HFA) 108 (90 Base) MCG/ACT inhaler, Inhale 1-2 puffs into the lungs every 6 (six) hours as needed for wheezing or shortness of breath., Disp: 18 g, Rfl: 0   ALPRAZolam (XANAX) 0.5 MG tablet, Take 1 tablet (0.5 mg total) by mouth daily as needed for anxiety. TAKE 1 TABLET BY MOUTH AS NEEDED FOR ANXIETY., Disp: 30 tablet, Rfl: 0   Biotin w/ Vitamins C & E (HAIR/SKIN/NAILS PO), Take 1 tablet by mouth daily., Disp: , Rfl:    Cholecalciferol (VITAMIN D3) 125 MCG (5000 UT) CAPS, Take 5,000 Units by mouth daily., Disp: , Rfl:    escitalopram (LEXAPRO) 20 MG tablet, Take 1 tablet (20 mg total) by mouth daily., Disp: 90 tablet, Rfl: 3   famotidine (PEPCID) 20 MG tablet, Take 20 mg by mouth 2 (two) times daily., Disp: , Rfl:    letrozole (FEMARA) 2.5 MG tablet, TAKE 1 TABLET BY MOUTH EVERY DAY, Disp: 30 tablet, Rfl: 3   MAG-G 500 (27 Mg) MG TABS, TAKE 1 TABLET (500MG) BY MOUTH EVERY DAY (Patient taking differently: Take 500 mg by mouth daily.), Disp: 30 tablet, Rfl: 3   montelukast (SINGULAIR) 10 MG tablet, Take 1 tablet (10 mg total) by mouth at bedtime., Disp: 30 tablet, Rfl: 0   Potassium Chloride ER 20 MEQ TBCR, TAKE 1 TABLET BY MOUTH EVERY DAY, Disp: 90 tablet, Rfl: 1  pregabalin (LYRICA) 200 MG capsule, Take 200 mg by  mouth 2 (two) times daily., Disp: , Rfl:    pyridOXINE (VITAMIN B-6) 100 MG tablet, Take 100 mg by mouth daily., Disp: , Rfl:    thiamine (VITAMIN B-1) 100 MG tablet, Take 250 mg by mouth daily. , Disp: , Rfl:    vitamin B-12 (CYANOCOBALAMIN) 500 MCG tablet, Take 2,500 mcg by mouth daily., Disp: , Rfl:   Physical exam:  Vitals:   02/21/22 1417  BP: 125/87  Pulse: 75  Resp: 16  Temp: 97.9 F (36.6 C)  TempSrc: Tympanic  SpO2: 96%  Weight: 193 lb (87.5 kg)   Physical Exam Cardiovascular:     Rate and Rhythm: Normal rate and regular rhythm.     Heart sounds: Normal heart sounds.  Pulmonary:     Effort: Pulmonary effort is normal.     Breath sounds: Normal breath sounds.  Abdominal:     General: Bowel sounds are normal.     Palpations: Abdomen is soft.  Skin:    General: Skin is warm and dry.  Neurological:     Mental Status: She is alert and oriented to person, place, and time.         Latest Ref Rng & Units 08/20/2021    2:06 PM  CMP  Glucose 70 - 99 mg/dL 112   BUN 6 - 20 mg/dL 15   Creatinine 0.44 - 1.00 mg/dL 0.95   Sodium 135 - 145 mmol/L 136   Potassium 3.5 - 5.1 mmol/L 3.8   Chloride 98 - 111 mmol/L 103   CO2 22 - 32 mmol/L 23   Calcium 8.9 - 10.3 mg/dL 9.4   Total Protein 6.5 - 8.1 g/dL 8.0   Total Bilirubin 0.3 - 1.2 mg/dL 1.0   Alkaline Phos 38 - 126 U/L 71   AST 15 - 41 U/L 26   ALT 0 - 44 U/L 25       Latest Ref Rng & Units 08/20/2021    2:06 PM  CBC  WBC 4.0 - 10.5 K/uL 4.9   Hemoglobin 12.0 - 15.0 g/dL 13.3   Hematocrit 36.0 - 46.0 % 37.7   Platelets 150 - 400 K/uL 136     Assessment and plan- Patient is a 46 y.o. female with pathological prognostic stage Ia invasive mammary carcinoma of the right breast MPT [id number] ER/PR positive HER2 negative.  She did not require adjuvant chemotherapy.  She completed adjuvant radiation treatment and is currently on letrozole.  This is a routine follow-up visit  No concerning signs and symptoms of recurrence  based on symptoms history and recent imaging. She will continue to take Letrozole for 10 years. RTC 6 months. She will be due for mammogram in 12/2022.    Visit Diagnosis 1. Encounter for follow-up surveillance of breast cancer   2. Use of letrozole (Femara)     Nelwyn Salisbury [facility] at [facility] 02/21/2022 3:31 PM

## 2022-02-21 NOTE — Telephone Encounter (Signed)
MEDICATION:  montelukast (SINGULAIR) 10 MG tablet  PHARMACY: [id number] [place], [place] [address] Comments: Patient is calling in stating that she was prescribed Singular, and wanted to let Dr.Cody know she is doing well.   **Let patient know to contact pharmacy at the end of the day to make sure medication is ready. **  ** Please notify patient to allow 48-72 hours to process**  **Encourage patient to contact the pharmacy for refills or they can request refills through [facility]*

## 2022-03-02 ENCOUNTER — Other Ambulatory Visit: Payer: Self-pay | Admitting: Oncology

## 2022-03-14 ENCOUNTER — Encounter: Payer: Self-pay | Admitting: Family Medicine

## 2022-03-16 ENCOUNTER — Other Ambulatory Visit: Payer: Self-pay | Admitting: Family Medicine

## 2022-03-16 DIAGNOSIS — J4520 Mild intermittent asthma, uncomplicated: Secondary | ICD-10-CM

## 2022-03-16 DIAGNOSIS — T7840XD Allergy, unspecified, subsequent encounter: Secondary | ICD-10-CM

## 2022-04-10 ENCOUNTER — Other Ambulatory Visit: Payer: Self-pay | Admitting: Family Medicine

## 2022-04-10 DIAGNOSIS — G629 Polyneuropathy, unspecified: Secondary | ICD-10-CM

## 2022-04-24 ENCOUNTER — Other Ambulatory Visit: Payer: Self-pay | Admitting: Family Medicine

## 2022-04-24 DIAGNOSIS — T7840XD Allergy, unspecified, subsequent encounter: Secondary | ICD-10-CM

## 2022-04-25 ENCOUNTER — Telehealth: Payer: Self-pay | Admitting: Family Medicine

## 2022-04-25 NOTE — Telephone Encounter (Signed)
  Encourage patient to contact the pharmacy for refills or they can request refills through [facility]  Did the patient contact the pharmacy: yes    LAST APPOINTMENT DATE:  Please schedule appointment if longer than 1 year  NEXT APPOINTMENT DATE:  MEDICATION:albuterol (VENTOLIN HFA) 108 (90 Base) MCG/ACT inhaler  Is the patient out of medication? no  If not, how much is left?5  Is this a 90 day supply: no  [id number] [address] [place] [address] [place]:  [phone] Fax:  [phone]     Let patient know to contact pharmacy at the end of the day to make sure medication is ready.  Please notify patient to allow 48-72 hours to process  CLINICAL FILLS OUT ALL BELOW:

## 2022-04-25 NOTE — Telephone Encounter (Signed)
Refill sent in by Beatriz Stallion

## 2022-04-28 ENCOUNTER — Other Ambulatory Visit: Payer: Self-pay | Admitting: Family Medicine

## 2022-04-28 DIAGNOSIS — F411 Generalized anxiety disorder: Secondary | ICD-10-CM

## 2022-05-19 ENCOUNTER — Other Ambulatory Visit: Payer: Self-pay | Admitting: Family Medicine

## 2022-05-19 DIAGNOSIS — T7840XD Allergy, unspecified, subsequent encounter: Secondary | ICD-10-CM

## 2022-05-23 ENCOUNTER — Encounter: Payer: Self-pay | Admitting: Nurse Practitioner

## 2022-05-23 ENCOUNTER — Ambulatory Visit (INDEPENDENT_AMBULATORY_CARE_PROVIDER_SITE_OTHER): Payer: BC Managed Care – PPO | Admitting: Nurse Practitioner

## 2022-05-23 VITALS — BP 122/76 | HR 77 | Temp 96.9°F | Resp 16 | Ht 65.0 in | Wt 190.0 lb

## 2022-05-23 DIAGNOSIS — J4540 Moderate persistent asthma, uncomplicated: Secondary | ICD-10-CM | POA: Insufficient documentation

## 2022-05-23 MED ORDER — BUDESONIDE-FORMOTEROL FUMARATE 80-4.5 MCG/ACT IN AERO: 2.0000 | INHALATION_SPRAY | Freq: Two times a day (BID) | RESPIRATORY_TRACT | 12 refills | Status: DC

## 2022-05-23 NOTE — Assessment & Plan Note (Addendum)
Patient currently maintained on Singulair and albuterol.  Using albuterol inhaler daily without great relief.  We will place patient on Symbicort 2 puffs twice daily.  Follow-up in 1 month.  Patient instructed to rinse mouth out after each use.  Also encourage patient to second-generation antihistamine over-the-counter if not had any improvement at next office visit to consider using a PPI and obtaining a chest x-ray along with increasing the dose of the Symbicort

## 2022-05-23 NOTE — Patient Instructions (Signed)
Nice to see you today I want to see you in 4 weeks for a recheck, sooner if you need me  Rinse your mouth out after each use

## 2022-05-23 NOTE — Progress Notes (Signed)
Acute Office Visit  Subjective:     Patient ID: Morgan Sanders, female    DOB: April 22, 1976, 46 y.o.   MRN: [id number]  Chief Complaint  Patient presents with   Cough    Gets cough, SOB, chest congestion this year has been a constant issue since April 2023 where she had to have nebulizer treatment. Usually have issues with season change. Has been using Albuterol inhaler regularly. Has been taking Singulair.      Patient is in today for Cough  States that she has been dealing with it since April. States that [facility] and had it better. Still coughing nad has not had to go back. States that she did have prednisone that helped States that she had asthma when she was pregnant with her first child and it has gotten worse  Does cough at night and wakes her husband up. States that she does not snore. States that the cough bothers her and makes her more winded when doing activities. She is a former smoker, has bee quit for 3 years. Last chest xray was 12/2021 that was normal  She is currently on singular but no antihistamine  Review of Systems  Constitutional:  Negative for chills and fever.  Respiratory:  Positive for cough and sputum production. Negative for shortness of breath.   Cardiovascular:  Negative for chest pain.        Objective:    BP 122/76   Pulse 77   Temp (!) 96.9 F (36.1 C)   Resp 16   Ht '5\' 5"'$  (1.651 m)   Wt 190 lb (86.2 kg)   SpO2 97%   BMI 31.62 kg/m    Physical Exam Vitals and nursing note reviewed.  Constitutional:      Appearance: Normal appearance.  HENT:     Right Ear: Tympanic membrane, ear canal and external ear normal.     Left Ear: Tympanic membrane, ear canal and external ear normal.     Nose:     Right Sinus: No maxillary sinus tenderness or frontal sinus tenderness.     Left Sinus: No maxillary sinus tenderness or frontal sinus tenderness.     Mouth/Throat:     Mouth: Mucous membranes are moist.     Pharynx: Oropharynx is  clear.  Cardiovascular:     Rate and Rhythm: Normal rate and regular rhythm.     Heart sounds: Normal heart sounds.  Pulmonary:     Effort: Pulmonary effort is normal.     Breath sounds: Wheezing present.  Lymphadenopathy:     Cervical: Cervical adenopathy present.  Neurological:     Mental Status: She is alert.     No results found for any visits on 05/23/22.      Assessment & Plan:   Problem List Items Addressed This Visit       Respiratory   Moderate persistent asthma without complication - Primary    Patient currently maintained on Singulair and albuterol.  Using albuterol inhaler daily without great relief.  We will place patient on Symbicort 2 puffs twice daily.  Follow-up in 1 month.  Patient instructed to rinse mouth out after each use.  Also encourage patient to second-generation antihistamine over-the-counter if not had any improvement at next office visit to consider using a PPI and obtaining a chest x-ray along with increasing the dose of the Symbicort      Relevant Medications   budesonide-formoterol (SYMBICORT) 80-4.5 MCG/ACT inhaler    Meds  ordered this encounter  Medications   budesonide-formoterol (SYMBICORT) 80-4.5 MCG/ACT inhaler    Sig: Inhale 2 puffs into the lungs 2 (two) times daily. Rinse mouth out after each use    Dispense:  1 each    Refill:  12    Order Specific Question:   Supervising Provider    Answer:   Loura Pardon A [[id number]]    Return in about 4 weeks (around 06/20/2022) for Asthma recheck.  Romilda Garret, NP

## 2022-05-28 ENCOUNTER — Telehealth: Payer: Self-pay | Admitting: Nurse Practitioner

## 2022-05-28 NOTE — Telephone Encounter (Signed)
Patient has been scheduled

## 2022-05-28 NOTE — Telephone Encounter (Signed)
Pt believes she has a sinus infection. Worse since visit, bloody mucus when blew nose this morning also reports yellow/green.   Pt requesting antibiotic. If cannot be called in, requesting virtual visit

## 2022-05-28 NOTE — Telephone Encounter (Signed)
Please schedule patient for a visit to discuss symptoms. Thank you

## 2022-05-29 ENCOUNTER — Telehealth (INDEPENDENT_AMBULATORY_CARE_PROVIDER_SITE_OTHER): Payer: BC Managed Care – PPO | Admitting: Nurse Practitioner

## 2022-05-29 ENCOUNTER — Encounter: Payer: Self-pay | Admitting: Nurse Practitioner

## 2022-05-29 DIAGNOSIS — J3489 Other specified disorders of nose and nasal sinuses: Secondary | ICD-10-CM | POA: Insufficient documentation

## 2022-05-29 DIAGNOSIS — Z87891 Personal history of nicotine dependence: Secondary | ICD-10-CM

## 2022-05-29 DIAGNOSIS — J069 Acute upper respiratory infection, unspecified: Secondary | ICD-10-CM | POA: Diagnosis not present

## 2022-05-29 DIAGNOSIS — R051 Acute cough: Secondary | ICD-10-CM | POA: Diagnosis not present

## 2022-05-29 MED ORDER — AMOXICILLIN-POT CLAVULANATE 875-125 MG PO TABS: 1.0000 | ORAL_TABLET | Freq: Two times a day (BID) | ORAL | 0 refills | Status: AC

## 2022-05-29 NOTE — Assessment & Plan Note (Signed)
Continue using Nettie pot and sinus rinses as beneficial

## 2022-05-29 NOTE — Assessment & Plan Note (Signed)
Given being a former smoker and the possibility Disease would like to treat with Augmentin.  Follow-up if no improvement

## 2022-05-29 NOTE — Progress Notes (Signed)
Patient ID: Morgan Sanders, female    DOB: December 02, 1975, 46 y.o.   MRN: [id number]  Virtual visit completed through [employer], a video enabled telemedicine application. Due to national recommendations of social distancing due to COVID-19, a virtual visit is felt to be most appropriate for this patient at this time. Reviewed limitations, risks, security and privacy concerns of performing a virtual visit and the availability of in person appointments. I also reviewed that there may be a patient responsible charge related to this service. The patient agreed to proceed.   Patient location: home Provider location: [place] at [facility], office Persons participating in this virtual visit: patient, provider   If any vitals were documented, they were collected by patient at home unless specified below.    There were no vitals taken for this visit.   CC: Sinus issues Subjective:   HPI: Morgan Sanders is a 46 y.o. female presenting on 05/29/2022 for Sinus Problem (Sx started on 05/25/22 and got worse. Head and sinus congestion, pressure, sinus headache-blowing out yellow/greenish/bloody mucus, when blowing nose feels/hears popping, ear drainage, post nasal drip. Covid test negative on 05/25/22./)   Symptoms started on 05/25/2022 No contacts Did Covid test on Sunday that was negative Pfizer x2 vaccine Has been taking dayquill, netti piot and saline nasal spray. States that it did help  States that she was using netti pot prior to seein me       Relevant past medical, surgical, family and social history reviewed and updated as indicated. Interim medical history since our last visit reviewed. Allergies and medications reviewed and updated. Outpatient Medications Prior to Visit  Medication Sig Dispense Refill   albuterol (VENTOLIN HFA) 108 (90 Base) MCG/ACT inhaler INHALE 2 PUFFS INTO THE LUNGS EVERY 4 (FOUR) HOURS AS NEEDED FOR SHORTNESS OF BREATH OR WHEEZING 18 each 1   ALPRAZolam (XANAX)  0.5 MG tablet TAKE 1 TABLET (0.5 MG TOTAL) BY MOUTH DAILY AS NEEDED FOR ANXIETY. 30 tablet 0   budesonide-formoterol (SYMBICORT) 80-4.5 MCG/ACT inhaler Inhale 2 puffs into the lungs 2 (two) times daily. Rinse mouth out after each use 1 each 12   Cholecalciferol (VITAMIN D3) 125 MCG (5000 UT) CAPS Take 5,000 Units by mouth daily.     escitalopram (LEXAPRO) 20 MG tablet Take 1 tablet (20 mg total) by mouth daily. 90 tablet 3   famotidine (PEPCID) 20 MG tablet Take 20 mg by mouth 2 (two) times daily.     letrozole (FEMARA) 2.5 MG tablet TAKE 1 TABLET BY MOUTH EVERY DAY 30 tablet 3   MAG-G 500 (27 Mg) MG TABS TAKE 1 TABLET ('500MG'$ ) BY MOUTH EVERY DAY (Patient taking differently: Take 500 mg by mouth daily.) 30 tablet 3   montelukast (SINGULAIR) 10 MG tablet TAKE 1 TABLET BY MOUTH EVERYDAY AT BEDTIME 90 tablet 1   Potassium Chloride ER 20 MEQ TBCR TAKE 1 TABLET BY MOUTH EVERY DAY 90 tablet 1   pregabalin (LYRICA) 200 MG capsule Take 200 mg by mouth 2 (two) times daily.     No facility-administered medications prior to visit.     Per HPI unless specifically indicated in ROS section below Review of Systems  Constitutional:  Positive for fatigue. Negative for appetite change, chills and fever.  HENT:  Positive for ear pain (draining), sinus pressure and sinus pain. Negative for ear discharge and sore throat.   Respiratory:  Positive for cough. Negative for shortness of breath and wheezing.   Cardiovascular:  Negative for chest pain.  Gastrointestinal:  Negative for abdominal pain, diarrhea, nausea and vomiting.  Musculoskeletal:  Negative for arthralgias and myalgias.  Neurological:  Positive for headaches.   Objective:  There were no vitals taken for this visit.  Wt Readings from Last 3 Encounters:  05/23/22 190 lb (86.2 kg)  02/21/22 193 lb (87.5 kg)  01/20/22 195 lb 2 oz (88.5 kg)       Physical exam: Gen: alert, NAD, not ill appearing Pulm: speaks in complete sentences without increased  work of breathing Psych: normal mood, normal thought content      Results for orders placed or performed in visit on 03/14/22  HM PAP SMEAR  Result Value Ref Range   HM Pap smear negative   Results Console HPV  Result Value Ref Range   CHL HPV Negative    Assessment & Plan:   Problem List Items Addressed This Visit       Respiratory   Upper respiratory tract infection - Primary    COVID test was negative at home.  Given patient's recent office visit and comorbidities of having asthma was not well controlled and possible mixed lung disease we will like to treat patient with Augmentin 875-125 twice daily for 7 days.  Patient can read COVID test to make sure she is not COVID-positive.      Relevant Medications   amoxicillin-clavulanate (AUGMENTIN) 875-125 MG tablet     Other   Sinus pressure    Continue using Nettie pot and sinus rinses as beneficial      Acute cough    Continue using over-the-counter medications as needed      Former smoker    Given being a former smoker and the possibility Disease would like to treat with Augmentin.  Follow-up if no improvement        Meds ordered this encounter  Medications   amoxicillin-clavulanate (AUGMENTIN) 875-125 MG tablet    Sig: Take 1 tablet by mouth 2 (two) times daily for 7 days.    Dispense:  14 tablet    Refill:  0    Order Specific Question:   Supervising Provider    Answer:   TOWER, MARNE A [[id number]]   No orders of the defined types were placed in this encounter.   I discussed the assessment and treatment plan with the patient. The patient was provided an opportunity to ask questions and all were answered. The patient agreed with the plan and demonstrated an understanding of the instructions. The patient was advised to call back or seek an in-person evaluation if the symptoms worsen or if the condition fails to improve as anticipated.  Follow up plan: Return if symptoms worsen or fail to improve, for As  scheduled.  Romilda Garret, NP

## 2022-05-29 NOTE — Assessment & Plan Note (Signed)
COVID test was negative at home.  Given patient's recent office visit and comorbidities of having asthma was not well controlled and possible mixed lung disease we will like to treat patient with Augmentin 875-125 twice daily for 7 days.  Patient can read COVID test to make sure she is not COVID-positive.

## 2022-05-29 NOTE — Assessment & Plan Note (Signed)
Continue using over-the-counter medications as needed

## 2022-06-20 ENCOUNTER — Ambulatory Visit: Payer: BC Managed Care – PPO | Admitting: Nurse Practitioner

## 2022-06-27 ENCOUNTER — Encounter: Payer: Self-pay | Admitting: Family Medicine

## 2022-06-27 ENCOUNTER — Ambulatory Visit (INDEPENDENT_AMBULATORY_CARE_PROVIDER_SITE_OTHER): Payer: BC Managed Care – PPO | Admitting: Family Medicine

## 2022-06-27 VITALS — BP 120/70 | HR 77 | Temp 98.6°F | Ht 65.0 in | Wt 193.5 lb

## 2022-06-27 DIAGNOSIS — J4540 Moderate persistent asthma, uncomplicated: Secondary | ICD-10-CM

## 2022-06-27 DIAGNOSIS — S0990XA Unspecified injury of head, initial encounter: Secondary | ICD-10-CM | POA: Diagnosis not present

## 2022-06-27 DIAGNOSIS — E538 Deficiency of other specified B group vitamins: Secondary | ICD-10-CM | POA: Diagnosis not present

## 2022-06-27 DIAGNOSIS — R55 Syncope and collapse: Secondary | ICD-10-CM | POA: Diagnosis not present

## 2022-06-27 LAB — COMPREHENSIVE METABOLIC PANEL
AG Ratio: 1.8 (calc) (ref 1.0–2.5)
ALT: 14 U/L (ref 6–29)
AST: 17 U/L (ref 10–35)
Albumin: 4.6 g/dL (ref 3.6–5.1)
Alkaline phosphatase (APISO): 67 U/L (ref 31–125)
BUN/Creatinine Ratio: 14 (calc) (ref 6–22)
BUN: 14 mg/dL (ref 7–25)
CO2: 28 mmol/L (ref 20–32)
Calcium: 9.6 mg/dL (ref 8.6–10.2)
Chloride: 100 mmol/L (ref 98–110)
Creat: 1 mg/dL — ABNORMAL HIGH (ref 0.50–0.99)
Globulin: 2.6 g/dL (calc) (ref 1.9–3.7)
Glucose, Bld: 110 mg/dL — ABNORMAL HIGH (ref 65–99)
Potassium: 4.4 mmol/L (ref 3.5–5.3)
Sodium: 135 mmol/L (ref 135–146)
Total Bilirubin: 0.5 mg/dL (ref 0.2–1.2)
Total Protein: 7.2 g/dL (ref 6.1–8.1)

## 2022-06-27 LAB — CBC WITH DIFFERENTIAL/PLATELET
Absolute Monocytes: 416 cells/uL (ref 200–950)
Basophils Absolute: 38 cells/uL (ref 0–200)
Basophils Relative: 0.7 %
Eosinophils Absolute: 59 cells/uL (ref 15–500)
Eosinophils Relative: 1.1 %
HCT: 35.3 % (ref 35.0–45.0)
Hemoglobin: 12.5 g/dL (ref 11.7–15.5)
Lymphs Abs: 1453 cells/uL (ref 850–3900)
MCH: 34.2 pg — ABNORMAL HIGH (ref 27.0–33.0)
MCHC: 35.4 g/dL (ref 32.0–36.0)
MCV: 96.7 fL (ref 80.0–100.0)
MPV: 11 fL (ref 7.5–12.5)
Monocytes Relative: 7.7 %
Neutro Abs: 3434 cells/uL (ref 1500–7800)
Neutrophils Relative %: 63.6 %
Platelets: 121 10*3/uL — ABNORMAL LOW (ref 140–400)
RBC: 3.65 10*6/uL — ABNORMAL LOW (ref 3.80–5.10)
RDW: 12.7 % (ref 11.0–15.0)
Total Lymphocyte: 26.9 %
WBC: 5.4 10*3/uL (ref 3.8–10.8)

## 2022-06-27 LAB — VITAMIN B12: Vitamin B-12: 739 pg/mL (ref 200–1100)

## 2022-06-27 LAB — TSH: TSH: 1.87 mIU/L

## 2022-06-27 NOTE — Assessment & Plan Note (Signed)
Acute , unexplained. EKG: normal EKG, normal sinus rhythm, unchanged from previous tracings, nonspecific T wave changes..  Negative orthostatic vitals in office.  Will eval with  stat labs including TSH, cbc, CMET, B12. Consider imaging if npot clear cause   Go to ER if repeated event.

## 2022-06-27 NOTE — Progress Notes (Signed)
Patient ID: Morgan Sanders, female    DOB: 06-Apr-1976, 46 y.o.   MRN: [id number]  This visit was conducted in person.  BP 120/70   Pulse 77   Temp 98.6 F (37 C) (Oral)   Ht '5\' 5"'$  (1.651 m)   Wt 193 lb 8 oz (87.8 kg)   SpO2 95%   BMI 32.20 kg/m    CC:  Chief Complaint  Patient presents with   Fall    Tuesday night-Hit head   Dizziness   Loss of Consciousness    Subjective:   HPI: Morgan Sanders is a 46 y.o. female previous patient of Dr. Verda Cumins with history of alcoholic cirrhosis of the liver with ascites, moderate persistent asthma, NASH, history of GI bleed causing anemia , history of breast cancer, major depressive disorder, generalized anxiety and B12 deficiency presenting on 06/27/2022 for Fall (Tuesday night-Hit head), Dizziness, and Loss of Consciousness  Of note she did recently see my partner Romilda Garret, NP on 05/23/2022 for moderate persistent asthma was started on Symbicort  Today she reports dizziness resulting in loss of consciousness and fall on Tuesday night 3 nights ago.  She has been feeling dizzy spells off and on going from sitting standing,random  in last 2 week.  BP Readings from Last 3 Encounters:  06/27/22 120/70  05/23/22 122/76  02/21/22 125/87   ON Tuesday was sitting, go up to get something to drink.. felt lightheaded.  No neuro changes, no SOB, no chest pain or palpitations proceeding.  LOC for seconds. Hit head on way down left front of forehead.. bruising and hematoma Daughter came running.. no  rhythmic movement.   No current headache or confusion, no neuro changes.    No recent bleeding ( no blood in stool), good po intake. Good water intake. She stopped vit B12 1-2 months ago.   Symbicort  has helped breathing.. does seem to help  Does se e some black when she coughs.. no blood.  Former smoker.    No association with lyrica or alprazolam.  Relevant past medical, surgical, family and social history reviewed and updated as  indicated. Interim medical history since our last visit reviewed. Allergies and medications reviewed and updated. Outpatient Medications Prior to Visit  Medication Sig Dispense Refill   albuterol (VENTOLIN HFA) 108 (90 Base) MCG/ACT inhaler INHALE 2 PUFFS INTO THE LUNGS EVERY 4 (FOUR) HOURS AS NEEDED FOR SHORTNESS OF BREATH OR WHEEZING 18 each 1   ALPRAZolam (XANAX) 0.5 MG tablet TAKE 1 TABLET (0.5 MG TOTAL) BY MOUTH DAILY AS NEEDED FOR ANXIETY. 30 tablet 0   budesonide-formoterol (SYMBICORT) 80-4.5 MCG/ACT inhaler Inhale 2 puffs into the lungs 2 (two) times daily. Rinse mouth out after each use 1 each 12   Cholecalciferol (VITAMIN D3) 125 MCG (5000 UT) CAPS Take 5,000 Units by mouth daily.     escitalopram (LEXAPRO) 20 MG tablet Take 1 tablet (20 mg total) by mouth daily. 90 tablet 3   famotidine (PEPCID) 20 MG tablet Take 20 mg by mouth 2 (two) times daily.     letrozole (FEMARA) 2.5 MG tablet TAKE 1 TABLET BY MOUTH EVERY DAY 30 tablet 3   MAG-G 500 (27 Mg) MG TABS TAKE 1 TABLET ('500MG'$ ) BY MOUTH EVERY DAY (Patient taking differently: Take 500 mg by mouth daily.) 30 tablet 3   montelukast (SINGULAIR) 10 MG tablet TAKE 1 TABLET BY MOUTH EVERYDAY AT BEDTIME 90 tablet 1   Potassium Chloride ER 20 MEQ TBCR TAKE 1  TABLET BY MOUTH EVERY DAY 90 tablet 1   pregabalin (LYRICA) 200 MG capsule Take 200 mg by mouth 2 (two) times daily.     No facility-administered medications prior to visit.     Per HPI unless specifically indicated in ROS section below Review of Systems  Constitutional:  Negative for fatigue and fever.  HENT:  Negative for congestion.   Eyes:  Negative for pain.  Respiratory:  Negative for cough and shortness of breath.   Cardiovascular:  Negative for chest pain, palpitations and leg swelling.  Gastrointestinal:  Negative for abdominal pain.  Genitourinary:  Negative for dysuria and vaginal bleeding.  Musculoskeletal:  Negative for back pain.  Neurological:  Positive for  dizziness, syncope, weakness and light-headedness. Negative for tremors, seizures, facial asymmetry, speech difficulty, numbness and headaches.  Psychiatric/Behavioral:  Negative for dysphoric mood. The patient is not nervous/anxious.    Objective:  BP 120/70   Pulse 77   Temp 98.6 F (37 C) (Oral)   Ht '5\' 5"'$  (1.651 m)   Wt 193 lb 8 oz (87.8 kg)   SpO2 95%   BMI 32.20 kg/m   Wt Readings from Last 3 Encounters:  06/27/22 193 lb 8 oz (87.8 kg)  05/23/22 190 lb (86.2 kg)  02/21/22 193 lb (87.5 kg)      Physical Exam Constitutional:      General: She is not in acute distress.    Appearance: Normal appearance. She is well-developed. She is not ill-appearing or toxic-appearing.  HENT:     Head: Normocephalic.     Comments: Patient does have a ridge on central skull that she states is extending towards forehead nontender no erythema no fluctuance    Right Ear: Hearing, tympanic membrane, ear canal and external ear normal. Tympanic membrane is not erythematous, retracted or bulging.     Left Ear: Hearing, tympanic membrane, ear canal and external ear normal. Tympanic membrane is not erythematous, retracted or bulging.     Nose: No mucosal edema or rhinorrhea.     Right Sinus: No maxillary sinus tenderness or frontal sinus tenderness.     Left Sinus: No maxillary sinus tenderness or frontal sinus tenderness.     Mouth/Throat:     Pharynx: Uvula midline.  Eyes:     General: Lids are normal. Lids are everted, no foreign bodies appreciated.     Conjunctiva/sclera: Conjunctivae normal.     Pupils: Pupils are equal, round, and reactive to light.  Neck:     Thyroid: No thyroid mass or thyromegaly.     Vascular: No carotid bruit.     Trachea: Trachea normal.  Cardiovascular:     Rate and Rhythm: Normal rate and regular rhythm.     Pulses: Normal pulses.     Heart sounds: Normal heart sounds, S1 normal and S2 normal. No murmur heard.    No friction rub. No gallop.  Pulmonary:      Effort: Pulmonary effort is normal. No tachypnea or respiratory distress.     Breath sounds: Normal breath sounds. No decreased breath sounds, wheezing, rhonchi or rales.  Abdominal:     General: Bowel sounds are normal.     Palpations: Abdomen is soft.     Tenderness: There is no abdominal tenderness.  Musculoskeletal:     Cervical back: Normal range of motion and neck supple.  Skin:    General: Skin is warm and dry.     Findings: No rash.  Neurological:     Mental Status: She  is alert.     Cranial Nerves: Cranial nerves 2-12 are intact.     Sensory: Sensation is intact.     Motor: Motor function is intact.  Psychiatric:        Mood and Affect: Mood is not anxious or depressed.        Speech: Speech normal.        Behavior: Behavior normal. Behavior is cooperative.        Thought Content: Thought content normal.        Judgment: Judgment normal.       Results for orders placed or performed in visit on 03/14/22  HM PAP SMEAR  Result Value Ref Range   HM Pap smear negative   Results Console HPV  Result Value Ref Range   CHL HPV Negative      COVID 19 screen:  No recent travel or known exposure to [place] The patient denies respiratory symptoms of COVID 19 at this time. The importance of social distancing was discussed today.   Assessment and Plan  Problem List Items Addressed This Visit     B12 deficiency    Chronic, will reevaluate as she has been off her B12 for a while.      Head injury    Acute, no red flags for intracranial bleed.  Healing bruising on left forehead and above I.  No clear sign of facial fracture.  No suggestion of concussion.      Moderate persistent asthma without complication    Chronic, per patient's symptoms have improved dramatically on Symbicort.  She does note some black in her mucus that she produces at times.  She does not feel like this is clearly blood but will consider further evaluation if persistent.      Syncope and collapse -  Primary    Acute , unexplained. EKG: normal EKG, normal sinus rhythm, unchanged from previous tracings, nonspecific T wave changes..  Negative orthostatic vitals in office.  Will eval with  stat labs including TSH, cbc, CMET, B12. Consider imaging if npot clear cause   Go to ER if repeated event.       Relevant Orders   Comprehensive metabolic panel   CBC with Differential/Platelet   Vitamin B12   TSH   EKG 12-Lead (Completed)           Eliezer Lofts, MD

## 2022-06-27 NOTE — Assessment & Plan Note (Signed)
Chronic, will reevaluate as she has been off her B12 for a while.

## 2022-06-27 NOTE — Assessment & Plan Note (Signed)
Chronic, per patient's symptoms have improved dramatically on Symbicort.  She does note some black in her mucus that she produces at times.  She does not feel like this is clearly blood but will consider further evaluation if persistent.

## 2022-06-27 NOTE — Assessment & Plan Note (Signed)
Acute, no red flags for intracranial bleed.  Healing bruising on left forehead and above I.  No clear sign of facial fracture.  No suggestion of concussion.

## 2022-06-27 NOTE — Patient Instructions (Signed)
Please stop at the lab to have labs drawn.  Go to ER if chest pain, shortness of breath or recurrent syncope.

## 2022-06-30 ENCOUNTER — Other Ambulatory Visit: Payer: Self-pay | Admitting: Oncology

## 2022-06-30 ENCOUNTER — Encounter: Payer: Self-pay | Admitting: Family Medicine

## 2022-06-30 ENCOUNTER — Other Ambulatory Visit: Payer: Self-pay | Admitting: *Deleted

## 2022-06-30 DIAGNOSIS — R55 Syncope and collapse: Secondary | ICD-10-CM

## 2022-06-30 DIAGNOSIS — R42 Dizziness and giddiness: Secondary | ICD-10-CM

## 2022-06-30 DIAGNOSIS — C50911 Malignant neoplasm of unspecified site of right female breast: Secondary | ICD-10-CM

## 2022-07-09 ENCOUNTER — Telehealth: Payer: Self-pay | Admitting: Family Medicine

## 2022-07-09 NOTE — Telephone Encounter (Signed)
Patient is returning call re: MRI order, stated that something needed to be changed?  Not able to tell by the note attached  Patient is requesting a call back at [phone]

## 2022-07-09 NOTE — Telephone Encounter (Signed)
Pt called stating

## 2022-07-10 NOTE — Telephone Encounter (Signed)
I am sorry I did not see this until now.  Was this taken care of?

## 2022-07-15 DIAGNOSIS — R42 Dizziness and giddiness: Secondary | ICD-10-CM | POA: Insufficient documentation

## 2022-07-15 NOTE — Telephone Encounter (Signed)
Per MyChart message from Patient.  Good evening hope you had a nice Thanksgiving. My MRI Wednesday shows with out contrast, I would like to have it with the contrast please. Thank you   Please update order if appropriate.

## 2022-07-15 NOTE — Telephone Encounter (Signed)
Responded to patient via MyChart.

## 2022-07-15 NOTE — Telephone Encounter (Signed)
Pt returned call would like a call back # [phone]

## 2022-07-15 NOTE — Addendum Note (Signed)
Addended byEliezer Lofts E on: 07/15/2022 05:30 PM   Modules accepted: Orders

## 2022-07-15 NOTE — Telephone Encounter (Signed)
Left message for Morgan Sanders to return call to office.  Need more clarification on what needs to be changed on her MRI order.

## 2022-07-16 ENCOUNTER — Other Ambulatory Visit: Payer: Self-pay | Admitting: Family Medicine

## 2022-07-16 ENCOUNTER — Ambulatory Visit
Admission: RE | Admit: 2022-07-16 | Discharge: 2022-07-16 | Disposition: A | Discharge status: Home / Self Care | Payer: BC Managed Care – PPO | Source: Ambulatory Visit | Attending: Family Medicine | Admitting: Family Medicine

## 2022-07-16 DIAGNOSIS — R55 Syncope and collapse: Secondary | ICD-10-CM | POA: Insufficient documentation

## 2022-07-16 DIAGNOSIS — R42 Dizziness and giddiness: Secondary | ICD-10-CM

## 2022-07-16 DIAGNOSIS — C50911 Malignant neoplasm of unspecified site of right female breast: Secondary | ICD-10-CM | POA: Diagnosis not present

## 2022-07-16 DIAGNOSIS — Z170 Estrogen receptor positive status [ER+]: Secondary | ICD-10-CM | POA: Diagnosis not present

## 2022-07-16 MED ORDER — GADOBUTROL 1 MMOL/ML IV SOLN
9.0000 mL | Freq: Once | INTRAVENOUS | Status: AC | PRN
  Administered 2022-07-16: 9 mL via INTRAVENOUS

## 2022-07-25 ENCOUNTER — Other Ambulatory Visit: Payer: Self-pay

## 2022-07-25 DIAGNOSIS — F411 Generalized anxiety disorder: Secondary | ICD-10-CM

## 2022-07-25 NOTE — Telephone Encounter (Signed)
Patient has TOC appt scheduled.

## 2022-07-25 NOTE — Telephone Encounter (Signed)
Received fax from [facility] for refill on Alprazolam 0.5 MG. Do see where patient has been seen by Dr Diona Browner but does not look like that was a TOC appointment. Please call patient to set up TOC once scheduled we will send refill for approval.

## 2022-07-28 ENCOUNTER — Other Ambulatory Visit: Payer: Self-pay | Admitting: Family

## 2022-07-28 DIAGNOSIS — F411 Generalized anxiety disorder: Secondary | ICD-10-CM

## 2022-07-28 MED ORDER — ALPRAZOLAM 0.5 MG PO TABS: ORAL_TABLET | ORAL | 0 refills | Status: DC

## 2022-07-28 NOTE — Telephone Encounter (Signed)
Patient call back to see if the prescription Alprazolam can be filled for her. Call back number [phone].

## 2022-07-28 NOTE — Addendum Note (Signed)
Addended by: Carter Kitten on: 07/28/2022 11:24 AM   Modules accepted: Orders

## 2022-07-28 NOTE — Telephone Encounter (Signed)
Last office visit 06/27/22 with Dr. Diona Browner for Syncope/B12 Deficiency and Asthma.  Last refilled 04/30/2022 for #30 with no refills.  TOC scheduled with Dr. Diona Browner 02/10/2023.

## 2022-08-27 ENCOUNTER — Inpatient Hospital Stay: Payer: BC Managed Care – PPO | Attending: Oncology | Admitting: Oncology

## 2022-08-27 ENCOUNTER — Encounter: Payer: Self-pay | Admitting: Oncology

## 2022-08-27 VITALS — BP 110/77 | HR 75 | Resp 18 | Ht 65.0 in | Wt 189.0 lb

## 2022-08-27 DIAGNOSIS — Z170 Estrogen receptor positive status [ER+]: Secondary | ICD-10-CM | POA: Diagnosis not present

## 2022-08-27 DIAGNOSIS — Z923 Personal history of irradiation: Secondary | ICD-10-CM | POA: Diagnosis not present

## 2022-08-27 DIAGNOSIS — C50911 Malignant neoplasm of unspecified site of right female breast: Secondary | ICD-10-CM | POA: Insufficient documentation

## 2022-08-27 DIAGNOSIS — Z79899 Other long term (current) drug therapy: Secondary | ICD-10-CM | POA: Diagnosis not present

## 2022-08-27 DIAGNOSIS — W19XXXA Unspecified fall, initial encounter: Secondary | ICD-10-CM

## 2022-08-27 DIAGNOSIS — Z79811 Long term (current) use of aromatase inhibitors: Secondary | ICD-10-CM | POA: Diagnosis not present

## 2022-08-27 DIAGNOSIS — Z08 Encounter for follow-up examination after completed treatment for malignant neoplasm: Secondary | ICD-10-CM | POA: Diagnosis not present

## 2022-08-27 DIAGNOSIS — Z853 Personal history of malignant neoplasm of breast: Secondary | ICD-10-CM

## 2022-08-27 DIAGNOSIS — Z9181 History of falling: Secondary | ICD-10-CM | POA: Diagnosis not present

## 2022-08-27 NOTE — Progress Notes (Signed)
Patient states that she had a couple falls recently she got checked out - MRI and ECG were normal in the office but her smart watch has been showing afib so she plans to let her MD know asap.

## 2022-08-27 NOTE — Progress Notes (Signed)
Hematology/Oncology Consult note [facility]  [phone] [phone]  Patient Care Team: Waunita Schooner, MD as PCP - General (Family Medicine) Reeves Forth (Gastroenterology) Sharlet Salina, MD as Referring Physician (Physical Medicine and Rehabilitation) Maximiano Coss, Loraine Grip, MD (Neurology) Theodore Demark, RN (Inactive) as Oncology Nurse Navigator   Name of the patient: Morgan Sanders  [id number]  11-05-1975   Date of visit: 08/27/22  Diagnosis-  pathological prognostic stage Ia invasive mammary carcinoma of the right breast and pT1b pN1 acM0 ER/PR positive HER2 negative    Chief complaint/ Reason for visit-routine follow-up of breast cancer on letrozole  Heme/Onc history: Patient is a 47 year old post menopausal female with a prior history of normocytic anemia for which she had a complete work-up including bone marrow biopsy as well as history of cirrhosis of the liver.  She is postmenopausal and she has not had any menstrual cycles since the age of 74.  Patient underwent a bilateral screening mammogram on 12/21/2020 which showed possible mass in the right breast.  This was followed by diagnostic mammogram and ultrasound which showed 2 masses in the right breast at 12 o'clock position each measuring 6 mm and 1.2 cm apart.  Ultrasound of the right axilla was negative for malignancy.  Patient underwent biopsy of both the medial and the lateral breast mass and it was consistent with invasive mammary carcinoma grade 2.  ER greater than 90% positive, PR greater than 90% positive and HER2 negative   Patient underwent right lumpectomy with sentinel lymph node biopsy Final pathology showed 2 tumors 7 mm in size both of which were grade 1.  The tissue in between did not have any evidence of malignancy or DCIS.  She was noted to have metastatic carcinoma in 2 out of 4 sentinel lymph node 6 mm deposit with no extracapsular extension.    MammaPrint score came back at low risk and therefore patient did not require any adjuvant chemotherapy.  Patient completed radiation treatment and startedLetrozole in August 2022  Interval history-tolerating letrozole well without any significant side effects.  Reports occasional couple of falls in the last 3 to 4 months.  She felt dizzy on a couple of occasions.  Her first fall was quite tedious when she hit the left side of her face and had a black eye.  She is getting this worked up by her primary care Dr. Einar Pheasant.  She also has an MRI brain coming up.  She recently bought a smart watch which showed that she was in A-fib on a few occasions.  ECOG PS- 0 Pain scale- 0   Review of systems- Review of Systems  Constitutional:  Negative for chills, fever, malaise/fatigue and weight loss.  HENT:  Negative for congestion, ear discharge and nosebleeds.   Eyes:  Negative for blurred vision.  Respiratory:  Negative for cough, hemoptysis, sputum production, shortness of breath and wheezing.   Cardiovascular:  Negative for chest pain, palpitations, orthopnea and claudication.  Gastrointestinal:  Negative for abdominal pain, blood in stool, constipation, diarrhea, heartburn, melena, nausea and vomiting.  Genitourinary:  Negative for dysuria, flank pain, frequency, hematuria and urgency.  Musculoskeletal:  Negative for back pain, joint pain and myalgias.  Skin:  Negative for rash.  Neurological:  Negative for dizziness, tingling, focal weakness, seizures, weakness and headaches.  Endo/Heme/Allergies:  Does not bruise/bleed easily.  Psychiatric/Behavioral:  Negative for depression and suicidal ideas. The patient does not have insomnia.  No Known Allergies   Past Medical History:  Diagnosis Date   Allergy    Anemia    Asthma    Blood transfusion without reported diagnosis    Breast cancer ([place])    Chicken pox    Colon polyps    Depression    Heart murmur    Hypertension    Neuromuscular  disorder (HCC)    neuropathy   Pneumonia    Stomach ulcer      Past Surgical History:  Procedure Laterality Date   BREAST BIOPSY Right 01/01/2021   u/s bx 12:00 1 cmfn path pending x clip lateral   BREAST BIOPSY Right 01/01/2021   u/s bx 12:00 1cmfn path pending vision medial   BREAST LUMPECTOMY     BREAST LUMPECTOMY WITH SENTINEL LYMPH NODE BIOPSY Right 01/18/2021   Procedure: BREAST LUMPECTOMY WITH SENTINEL LYMPH NODE BX;  Surgeon: Robert Bellow, MD;  Location: [facility] ORS;  Service: General;  Laterality: Right;   CHOLECYSTECTOMY     CHOLECYSTECTOMY, LAPAROSCOPIC  03/1996   COLONOSCOPY     ESOPHAGOGASTRODUODENOSCOPY (EGD) WITH PROPOFOL N/A 12/31/2018   Procedure: ESOPHAGOGASTRODUODENOSCOPY (EGD) WITH PROPOFOL;  Surgeon: Lucilla Lame, MD;  Location: [facility] ENDOSCOPY;  Service: Endoscopy;  Laterality: N/A;   TUBAL LIGATION     WISDOM TOOTH EXTRACTION      Social History   Socioeconomic History   Marital status: Married    Spouse name: [name]   Number of children: 2   Years of education: high school   Highest education level: 12th grade  Occupational History   Occupation: Golf/clubhouse attendent  Tobacco Use   Smoking status: Former    Packs/day: 1.00    Years: 20.00    Total pack years: 20.00    Types: Cigarettes    Quit date: 2020    Years since quitting: 4.0   Smokeless tobacco: Never  Vaping Use   Vaping Use: Some days   Substances: Nicotine, Flavoring  Substance and Sexual Activity   Alcohol use: Yes    Comment: a few times a year   Drug use: Never   Sexual activity: Not Currently  Other Topics Concern   Not on file  Social History Narrative   02/07/20   From: the area   Living: with husband, [name] (2000)   Work: at a golf course      Family: daughters - CJ (lives at [employer], 2 children) and Art gallery manager (still living at home, in Sports [employer] school)      Enjoys: golf       Exercise: golfing 3-4 times a week - alternating between cart/walking   Diet: pretty  good - eggs/fruit, eats lunch and dinner      Safety   Seat belts: Yes    Guns: Yes  and secure   Safe in relationships: Yes    Social Determinants of Health   Financial Resource Strain: Low Risk  (01/07/2019)   Overall Financial Resource Strain (CARDIA)    Difficulty of Paying Living Expenses: Not hard at all  Food Insecurity: No Food Insecurity (01/07/2019)   Hunger Vital Sign    Worried About Running Out of Food in the Last Year: Never true    [place] in the Last Year: Never true  Transportation Needs: No Transportation Needs (01/07/2019)   PRAPARE - Hydrologist (Medical): No    Lack of Transportation (Non-Medical): No  Physical Activity: Inactive (01/07/2019)   Exercise Vital Sign  Days of Exercise per Week: 0 days    Minutes of Exercise per Session: 0 min  Stress: No Stress Concern Present (01/07/2019)   [place] of Stress : Not at all  Social Connections: Unknown (01/07/2019)   Social Connection and Isolation Panel [NHANES]    Frequency of Communication with Friends and Family: More than three times a week    Frequency of Social Gatherings with Friends and Family: More than three times a week    Attends Religious Services: More than 4 times per year    Active Member of [employer] or Organizations: Not on file    Attends Archivist Meetings: Not on file    Marital Status: Not on file  Intimate Partner Violence: Not At Risk (01/07/2019)   Humiliation, Afraid, Rape, and Kick questionnaire    Fear of Current or Ex-Partner: No    Emotionally Abused: No    Physically Abused: No    Sexually Abused: No    Family History  Problem Relation Age of Onset   Breast cancer Maternal Grandmother 71   Arthritis Maternal Grandmother    Cancer Maternal Grandmother    Diabetes Maternal Grandmother    Hearing loss Maternal Grandmother    Hypertension Maternal  Grandmother    Bone cancer Maternal Great-grandmother    Hypertension Mother    Diabetes Mother    Hyperlipidemia Mother    Kidney disease Mother    Arthritis Mother    Asthma Mother    Depression Mother    Sudden death Father        hit by train   Liver disease Father        hx of liver transplant   Early death Father    Cataracts Paternal Grandfather    Lung cancer Paternal Grandfather    Hyperlipidemia Sister    Hypertension Sister    Depression Daughter    Early death Maternal Grandfather    Depression Daughter      Current Outpatient Medications:    albuterol (VENTOLIN HFA) 108 (90 Base) MCG/ACT inhaler, INHALE 2 PUFFS INTO THE LUNGS EVERY 4 (FOUR) HOURS AS NEEDED FOR SHORTNESS OF BREATH OR WHEEZING, Disp: 18 each, Rfl: 1   ALPRAZolam (XANAX) 0.5 MG tablet, TAKE 1 TABLET (0.5 MG TOTAL) BY MOUTH DAILY AS NEEDED FOR ANXIETY., Disp: 30 tablet, Rfl: 0   budesonide-formoterol (SYMBICORT) 80-4.5 MCG/ACT inhaler, Inhale 2 puffs into the lungs 2 (two) times daily. Rinse mouth out after each use, Disp: 1 each, Rfl: 12   Cholecalciferol (VITAMIN D3) 125 MCG (5000 UT) CAPS, Take 5,000 Units by mouth daily., Disp: , Rfl:    escitalopram (LEXAPRO) 20 MG tablet, Take 1 tablet (20 mg total) by mouth daily., Disp: 90 tablet, Rfl: 3   famotidine (PEPCID) 20 MG tablet, Take 20 mg by mouth 2 (two) times daily., Disp: , Rfl:    letrozole (FEMARA) 2.5 MG tablet, TAKE 1 TABLET BY MOUTH EVERY DAY, Disp: 30 tablet, Rfl: 3   montelukast (SINGULAIR) 10 MG tablet, TAKE 1 TABLET BY MOUTH EVERYDAY AT BEDTIME, Disp: 90 tablet, Rfl: 1   Potassium Chloride ER 20 MEQ TBCR, TAKE 1 TABLET BY MOUTH EVERY DAY, Disp: 90 tablet, Rfl: 1   pregabalin (LYRICA) 200 MG capsule, Take 200 mg by mouth 2 (two) times daily., Disp: , Rfl:    MAG-G 500 (27 Mg) MG TABS, TAKE 1 TABLET ('500MG'$ ) BY MOUTH EVERY DAY (Patient not taking:  Reported on 08/27/2022), Disp: 30 tablet, Rfl: 3  Physical exam:  Vitals:   08/27/22 0933  08/27/22 0936  BP:  110/77  Pulse:  75  Resp:  18  SpO2:  96%  Weight: 189 lb (85.7 kg)   Height:  '5\' 5"'$  (1.651 m)   Physical Exam Cardiovascular:     Rate and Rhythm: Normal rate and regular rhythm.     Heart sounds: Normal heart sounds.  Pulmonary:     Effort: Pulmonary effort is normal.     Breath sounds: Normal breath sounds.  Abdominal:     General: Bowel sounds are normal.     Palpations: Abdomen is soft.  Skin:    General: Skin is warm and dry.  Neurological:     Mental Status: She is alert and oriented to person, place, and time.    Breast exam was performed in seated and lying down position. Patient is status post right lumpectomy with a well-healed surgical scar. No evidence of any palpable masses. No evidence of axillary adenopathy. No evidence of any palpable masses or lumps in the left breast. No evidence of leftt axillary adenopathy      Latest Ref Rng & Units 06/27/2022    3:23 PM  CMP  Glucose 65 - 99 mg/dL 110   BUN 7 - 25 mg/dL 14   Creatinine 0.50 - 0.99 mg/dL 1.00   Sodium 135 - 146 mmol/L 135   Potassium 3.5 - 5.3 mmol/L 4.4   Chloride 98 - 110 mmol/L 100   CO2 20 - 32 mmol/L 28   Calcium 8.6 - 10.2 mg/dL 9.6   Total Protein 6.1 - 8.1 g/dL 7.2   Total Bilirubin 0.2 - 1.2 mg/dL 0.5   AST 10 - 35 U/L 17   ALT 6 - 29 U/L 14       Latest Ref Rng & Units 06/27/2022    3:23 PM  CBC  WBC 3.8 - 10.8 Thousand/uL 5.4   Hemoglobin 11.7 - 15.5 g/dL 12.5   Hematocrit 35.0 - 45.0 % 35.3   Platelets 140 - 400 Thousand/uL 121     Assessment and plan- Patient is a 47 y.o. female with pathological prognostic stage Ia invasive mammary carcinoma of the right breast MPT [id number] ER/PR positive HER2 negative.  She is status postlumpectomy and adjuvant radiation treatment.  She is currently on letrozole and this is a routine follow-up visit  Clinically patient is doing well with no concerning signs and symptoms of recurrence based on today's exam.  She will be  due for a mammogram in May 2024 which I will schedule.  I will see her back in 6 months no labs.  Patient has had a couple of episodes of lightheadedness and syncope.  Dr. Einar Pheasant her primary care doctor is working this up.  Patient tells me that she was noted to have abnormal heart rhythm on her smart watch in a couple of occasions.  She is here to discuss this with her primary care doctor.  I am referring her to cardiology at this time as well.   Visit Diagnosis 1. Encounter for follow-up surveillance of breast cancer   2. Malignant neoplasm of right breast, stage 1, estrogen receptor positive ([place])   3. Use of letrozole (Femara)   4. Fall, initial encounter      Dr. Randa Evens, MD, MPH [facility] [phone] 08/27/2022 1:05 PM

## 2022-09-05 ENCOUNTER — Other Ambulatory Visit: Payer: Self-pay

## 2022-09-05 DIAGNOSIS — T7840XD Allergy, unspecified, subsequent encounter: Secondary | ICD-10-CM

## 2022-09-05 DIAGNOSIS — J4520 Mild intermittent asthma, uncomplicated: Secondary | ICD-10-CM

## 2022-09-05 MED ORDER — MONTELUKAST SODIUM 10 MG PO TABS: ORAL_TABLET | ORAL | 1 refills | Status: DC

## 2022-10-01 ENCOUNTER — Other Ambulatory Visit: Payer: Self-pay | Admitting: Oncology

## 2022-10-03 ENCOUNTER — Encounter: Payer: Self-pay | Admitting: Cardiology

## 2022-10-03 ENCOUNTER — Ambulatory Visit: Payer: BC Managed Care – PPO | Attending: Cardiology | Admitting: Cardiology

## 2022-10-03 ENCOUNTER — Ambulatory Visit (INDEPENDENT_AMBULATORY_CARE_PROVIDER_SITE_OTHER): Payer: BC Managed Care – PPO

## 2022-10-03 VITALS — BP 128/84 | HR 77 | Ht 66.0 in | Wt 189.0 lb

## 2022-10-03 DIAGNOSIS — Z7289 Other problems related to lifestyle: Secondary | ICD-10-CM

## 2022-10-03 DIAGNOSIS — R002 Palpitations: Secondary | ICD-10-CM | POA: Diagnosis not present

## 2022-10-03 DIAGNOSIS — R011 Cardiac murmur, unspecified: Secondary | ICD-10-CM

## 2022-10-03 NOTE — Patient Instructions (Signed)
Medication Instructions:   Your physician recommends that you continue on your current medications as directed. Please refer to the Current Medication list given to you today.  *If you need a refill on your cardiac medications before your next appointment, please call your pharmacy*   Lab Work:  None Ordered  If you have labs (blood work) drawn today and your tests are completely normal, you will receive your results only by: [place] (if you have MyChart) OR A paper copy in the mail If you have any lab test that is abnormal or we need to change your treatment, we will call you to review the results.   Testing/Procedures:  Your physician has requested that you have an echocardiogram. Echocardiography is a painless test that uses sound waves to create images of your heart. It provides your doctor with information about the size and shape of your heart and how well your heart's chambers and valves are working. This procedure takes approximately one hour. There are no restrictions for this procedure. Please do NOT wear cologne, perfume, aftershave, or lotions (deodorant is allowed). Please arrive 15 minutes prior to your appointment time.  Your physician has recommended that you wear a Zio monitor.   This monitor is a medical device that records the heart's electrical activity. Doctors most often use these monitors to diagnose arrhythmias. Arrhythmias are problems with the speed or rhythm of the heartbeat. The monitor is a small device applied to your chest. You can wear one while you do your normal daily activities. While wearing this monitor if you have any symptoms to push the button and record what you felt. Once you have worn this monitor for the period of time provider prescribed (Usually 14 days), you will return the monitor device in the postage paid box. Once it is returned they will download the data collected and provide [place] with a report which the provider will then review and  we will call you with those results. Important tips:  Avoid showering during the first 24 hours of wearing the monitor. Avoid excessive sweating to help maximize wear time. Do not submerge the device, no hot tubs, and no swimming pools. Keep any lotions or oils away from the patch. After 24 hours you may shower with the patch on. Take brief showers with your back facing the shower head.  Do not remove patch once it has been placed because that will interrupt data and decrease adhesive wear time. Push the button when you have any symptoms and write down what you were feeling. Once you have completed wearing your monitor, remove and place into box which has postage paid and place in your outgoing mailbox.  If for some reason you have misplaced your box then call our office and we can provide another box and/or mail it off for you.      Follow-Up: At [facility], you and your health needs are our priority.  As part of our continuing mission to provide you with exceptional heart care, we have created designated Provider Care Teams.  These Care Teams include your primary Cardiologist (physician) and Advanced Practice Providers (APPs -  Physician Assistants and Nurse Practitioners) who all work together to provide you with the care you need, when you need it.  We recommend signing up for the patient portal called "MyChart".  Sign up information is provided on this After Visit Summary.  MyChart is used to connect with patients for Virtual Visits (Telemedicine).  Patients are able to  view lab/test results, encounter notes, upcoming appointments, etc.  Non-urgent messages can be sent to your provider as well.   To learn more about what you can do with MyChart, go to [email or web address].    Your next appointment:    After testing  Provider:   You may see Kate Sable, MD or one of the following Advanced Practice Providers on your designated Care Team:   Murray Hodgkins,  NP Christell Faith, PA-C Cadence Kathlen Mody, PA-C Gerrie Nordmann, NP

## 2022-10-03 NOTE — Progress Notes (Signed)
Cardiology Office Note:    Date:  10/03/2022   ID:  Morgan Sanders, DOB 01-Dec-1975, MRN [id number]  PCP:  Waunita Schooner, [place] Prov[facility] Cardiologist:  Kate Sable, MD     Referring MD: Waunita Schooner, MD   Chief Complaint  Patient presents with   New Patient (Initial Visit)    Fall, initial encounter, Afib notifications on Smart watch, Hx murmur, Family Hx   Morgan Sanders is a 47 y.o. female who is being seen today for the evaluation of cardiac murmur at the request of Waunita Schooner, MD.  History of Present Illness:    Morgan Sanders is a 47 y.o. female with a hx of asthma, right breast cancer s/p lumpectomy, former smoker x 20 years, currently engages in [place] who presents due to cardiac murmur and abnormal heart rhythm.  States having symptoms of palpitations ongoing over the past 1 to 2 months symptoms are not associated with exertion.  She has a smart watch which has noticed/mention A-fib during episodes of palpitations.  Has been told she has a cardiac murmur in the past.  Denies chest pain.  Currently engages in e-cigarette use.  Past Medical History:  Diagnosis Date   Allergy    Anemia    Asthma    Blood transfusion without reported diagnosis    Breast cancer ([place])    Chicken pox    Colon polyps    Depression    Heart murmur    Hypertension    Neuromuscular disorder (HCC)    neuropathy   Pneumonia    Stomach ulcer     Past Surgical History:  Procedure Laterality Date   BREAST BIOPSY Right 01/01/2021   u/s bx 12:00 1 cmfn path pending x clip lateral   BREAST BIOPSY Right 01/01/2021   u/s bx 12:00 1cmfn path pending vision medial   BREAST LUMPECTOMY     BREAST LUMPECTOMY WITH SENTINEL LYMPH NODE BIOPSY Right 01/18/2021   Procedure: BREAST LUMPECTOMY WITH SENTINEL LYMPH NODE BX;  Surgeon: Robert Bellow, MD;  Location: [facility] ORS;  Service: General;  Laterality: Right;   CHOLECYSTECTOMY     CHOLECYSTECTOMY, LAPAROSCOPIC   03/1996   COLONOSCOPY     ESOPHAGOGASTRODUODENOSCOPY (EGD) WITH PROPOFOL N/A 12/31/2018   Procedure: ESOPHAGOGASTRODUODENOSCOPY (EGD) WITH PROPOFOL;  Surgeon: Lucilla Lame, MD;  Location: [facility] ENDOSCOPY;  Service: Endoscopy;  Laterality: N/A;   TUBAL LIGATION     WISDOM TOOTH EXTRACTION      Current Medications: Current Meds  Medication Sig   albuterol (VENTOLIN HFA) 108 (90 Base) MCG/ACT inhaler INHALE 2 PUFFS INTO THE LUNGS EVERY 4 (FOUR) HOURS AS NEEDED FOR SHORTNESS OF BREATH OR WHEEZING   ALPRAZolam (XANAX) 0.5 MG tablet TAKE 1 TABLET (0.5 MG TOTAL) BY MOUTH DAILY AS NEEDED FOR ANXIETY.   budesonide-formoterol (SYMBICORT) 80-4.5 MCG/ACT inhaler Inhale 2 puffs into the lungs 2 (two) times daily. Rinse mouth out after each use   Cholecalciferol (VITAMIN D3) 125 MCG (5000 UT) CAPS Take 5,000 Units by mouth daily.   escitalopram (LEXAPRO) 20 MG tablet Take 1 tablet (20 mg total) by mouth daily.   famotidine (PEPCID) 20 MG tablet Take 20 mg by mouth 2 (two) times daily.   letrozole (FEMARA) 2.5 MG tablet TAKE 1 TABLET BY MOUTH EVERY DAY   montelukast (SINGULAIR) 10 MG tablet TAKE 1 TABLET BY MOUTH EVERYDAY AT BEDTIME   Potassium Chloride ER 20 MEQ TBCR TAKE 1 TABLET BY MOUTH EVERY DAY  pregabalin (LYRICA) 200 MG capsule Take 200 mg by mouth 2 (two) times daily.     Allergies:   Patient has no known allergies.   Social History   Socioeconomic History   Marital status: Married    Spouse name: [name]   Number of children: 2   Years of education: high school   Highest education level: 12th grade  Occupational History   Occupation: Golf/clubhouse attendent  Tobacco Use   Smoking status: Every Day    Types: E-cigarettes   Smokeless tobacco: Never   Tobacco comments:    Former cigarette smoker  Scientific laboratory technician Use: Some days   Substances: Nicotine, Flavoring  Substance and Sexual Activity   Alcohol use: Yes    Comment: a few times a year   Drug use: Never   Sexual  activity: Not Currently  Other Topics Concern   Not on file  Social History Narrative   02/07/20   From: the area   Living: with husband, [name] (2000)   Work: at a golf course      Family: daughters - CJ (lives at [employer], 2 children) and Art gallery manager (still living at home, in [employer] school)      Enjoys: golf       Exercise: golfing 3-4 times a week - alternating between cart/walking   Diet: pretty good - eggs/fruit, eats lunch and dinner      Safety   Seat belts: Yes    Guns: Yes  and secure   Safe in relationships: Yes    Social Determinants of Health   Financial Resource Strain: Low Risk  (01/07/2019)   Overall Financial Resource Strain (CARDIA)    Difficulty of Paying Living Expenses: Not hard at all  Food Insecurity: No Food Insecurity (01/07/2019)   Hunger Vital Sign    Worried About Running Out of Food in the Last Year: Never true    [place] in the Last Year: Never true  Transportation Needs: No Transportation Needs (01/07/2019)   PRAPARE - Hydrologist (Medical): No    Lack of Transportation (Non-Medical): No  Physical Activity: Inactive (01/07/2019)   Exercise Vital Sign    Days of Exercise per Week: 0 days    Minutes of Exercise per Session: 0 min  Stress: No Stress Concern Present (01/07/2019)   [place]    Feeling of Stress : Not at all  Social Connections: Unknown (01/07/2019)   Social Connection and Isolation Panel [NHANES]    Frequency of Communication with Friends and Family: More than three times a week    Frequency of Social Gatherings with Friends and Family: More than three times a week    Attends Religious Services: More than 4 times per year    Active Member of [employer] or Organizations: Not on file    Attends Archivist Meetings: Not on file    Marital Status: Not on file     Family History: The patient's family history includes Arthritis in her  maternal grandmother and mother; Asthma in her mother; Bone cancer in her maternal great-grandmother; Breast cancer (age of onset: 61) in her maternal grandmother; Cancer in her maternal grandmother; Cataracts in her paternal grandfather; Depression in her daughter, daughter, and mother; Diabetes in her maternal grandmother and mother; Early death in her father and maternal grandfather; Hearing loss in her maternal grandmother; Heart attack in her paternal grandmother; Hyperlipidemia in  her mother and sister; Hypertension in her maternal grandmother, mother, and sister; Kidney disease in her mother; Liver disease in her father; Lung cancer in her paternal grandfather; Sudden death in her father.  ROS:   Please see the history of present illness.     All other systems reviewed and are negative.  EKGs/Labs/Other Studies Reviewed:    The following studies were reviewed today:   EKG:  EKG is  ordered today.  The ekg ordered today demonstrates normal sinus rhythm  Recent Labs: 06/27/2022: ALT 14; BUN 14; Creat 1.00; Hemoglobin 12.5; Platelets 121; Potassium 4.4; Sodium 135; TSH 1.87  Recent Lipid Panel    Component Value Date/Time   CHOL 257 (H) 12/02/2021 1538   TRIG (H) 12/02/2021 1538    619.0 Triglyceride is over 400; calculations on Lipids are invalid.   HDL 43.00 12/02/2021 1538   CHOLHDL 6 12/02/2021 1538   LDLDIRECT 115.0 12/02/2021 1538     Risk Assessment/Calculations:         Physical Exam:    VS:  BP 128/84 (BP Location: Left Arm, Patient Position: Sitting, Cuff Size: Normal)   Pulse 77   Ht 5' 6"$  (1.676 m)   Wt 189 lb (85.7 kg)   SpO2 97%   BMI 30.51 kg/m     Wt Readings from Last 3 Encounters:  10/03/22 189 lb (85.7 kg)  08/27/22 189 lb (85.7 kg)  06/27/22 193 lb 8 oz (87.8 kg)     GEN:  Well nourished, well developed in no acute distress HEENT: Normal NECK: No JVD; No carotid bruits CARDIAC: RRR, 2/6 systolic murmur RESPIRATORY: Expiratory wheezing  noted. ABDOMEN: Soft, non-tender, non-distended MUSCULOSKELETAL:  No edema; No deformity  SKIN: Warm and dry NEUROLOGIC:  Alert and oriented x 3 PSYCHIATRIC:  Normal affect   ASSESSMENT:    1. Palpitations   2. Systolic murmur   3. Engages in [place]:    In order of problems listed above:  Palpitations, smart watch mention atrial fibrillation.  Place cardiac monitor to evaluate any arrhythmia such as A-fib.  Orthostatic vitals with no evidence for orthostasis. Systolic murmur, get echocardiogram. Engages in vaping, cessation advised.  Follow-up after echo and cardiac monitor.     Medication Adjustments/Labs and Tests Ordered: Current medicines are reviewed at length with the patient today.  Concerns regarding medicines are outlined above.  Orders Placed This Encounter  Procedures   LONG TERM MONITOR (3-14 DAYS)   EKG 12-Lead   ECHOCARDIOGRAM COMPLETE   No orders of the defined types were placed in this encounter.   Patient Instructions  Medication Instructions:   Your physician recommends that you continue on your current medications as directed. Please refer to the Current Medication list given to you today.  *If you need a refill on your cardiac medications before your next appointment, please call your pharmacy*   Lab Work:  None Ordered  If you have labs (blood work) drawn today and your tests are completely normal, you will receive your results only by: [place] (if you have MyChart) OR A paper copy in the mail If you have any lab test that is abnormal or we need to change your treatment, we will call you to review the results.   Testing/Procedures:  Your physician has requested that you have an echocardiogram. Echocardiography is a painless test that uses sound waves to create images of your heart. It provides your doctor with information about the size and shape of your  heart and how well your heart's chambers and valves are working. This  procedure takes approximately one hour. There are no restrictions for this procedure. Please do NOT wear cologne, perfume, aftershave, or lotions (deodorant is allowed). Please arrive 15 minutes prior to your appointment time.  Your physician has recommended that you wear a Zio monitor.   This monitor is a medical device that records the heart's electrical activity. Doctors most often use these monitors to diagnose arrhythmias. Arrhythmias are problems with the speed or rhythm of the heartbeat. The monitor is a small device applied to your chest. You can wear one while you do your normal daily activities. While wearing this monitor if you have any symptoms to push the button and record what you felt. Once you have worn this monitor for the period of time provider prescribed (Usually 14 days), you will return the monitor device in the postage paid box. Once it is returned they will download the data collected and provide [place] with a report which the provider will then review and we will call you with those results. Important tips:  Avoid showering during the first 24 hours of wearing the monitor. Avoid excessive sweating to help maximize wear time. Do not submerge the device, no hot tubs, and no swimming pools. Keep any lotions or oils away from the patch. After 24 hours you may shower with the patch on. Take brief showers with your back facing the shower head.  Do not remove patch once it has been placed because that will interrupt data and decrease adhesive wear time. Push the button when you have any symptoms and write down what you were feeling. Once you have completed wearing your monitor, remove and place into box which has postage paid and place in your outgoing mailbox.  If for some reason you have misplaced your box then call our office and we can provide another box and/or mail it off for you.      Follow-Up: At [facility], you and your health needs are our priority.  As part  of our continuing mission to provide you with exceptional heart care, we have created designated Provider Care Teams.  These Care Teams include your primary Cardiologist (physician) and Advanced Practice Providers (APPs -  Physician Assistants and Nurse Practitioners) who all work together to provide you with the care you need, when you need it.  We recommend signing up for the patient portal called "MyChart".  Sign up information is provided on this After Visit Summary.  MyChart is used to connect with patients for Virtual Visits (Telemedicine).  Patients are able to view lab/test results, encounter notes, upcoming appointments, etc.  Non-urgent messages can be sent to your provider as well.   To learn more about what you can do with MyChart, go to [email or web address].    Your next appointment:    After testing  Provider:   You may see Kate Sable, MD or one of the following Advanced Practice Providers on your designated Care Team:   Murray Hodgkins, NP Christell Faith, PA-C Cadence Kathlen Mody, PA-C Gerrie Nordmann, NP    Signed, Kate Sable, MD  10/03/2022 4:29 PM    [place]

## 2022-10-07 DIAGNOSIS — R002 Palpitations: Secondary | ICD-10-CM | POA: Diagnosis not present

## 2022-11-19 ENCOUNTER — Ambulatory Visit: Payer: BC Managed Care – PPO | Attending: Cardiology

## 2022-11-19 DIAGNOSIS — R011 Cardiac murmur, unspecified: Secondary | ICD-10-CM | POA: Diagnosis not present

## 2022-11-19 LAB — ECHOCARDIOGRAM COMPLETE
AR max vel: 2.3 cm2
AV Area VTI: 2.35 cm2
AV Area mean vel: 2.26 cm2
AV Mean grad: 5 mmHg
AV Peak grad: 8.6 mmHg
Ao pk vel: 1.47 m/s
Area-P 1/2: 3.85 cm2
Calc EF: 56.4 %
S' Lateral: 3.9 cm
Single Plane A2C EF: 62.2 %
Single Plane A4C EF: 54.2 %

## 2022-11-28 ENCOUNTER — Ambulatory Visit: Payer: BC Managed Care – PPO | Attending: Cardiology | Admitting: Cardiology

## 2022-11-28 ENCOUNTER — Encounter: Payer: Self-pay | Admitting: Cardiology

## 2022-11-28 VITALS — BP 130/72 | HR 86 | Ht 66.0 in | Wt 189.0 lb

## 2022-11-28 DIAGNOSIS — R002 Palpitations: Secondary | ICD-10-CM

## 2022-11-28 DIAGNOSIS — Z7289 Other problems related to lifestyle: Secondary | ICD-10-CM | POA: Diagnosis not present

## 2022-11-28 DIAGNOSIS — R011 Cardiac murmur, unspecified: Secondary | ICD-10-CM

## 2022-11-28 DIAGNOSIS — E781 Pure hyperglyceridemia: Secondary | ICD-10-CM | POA: Diagnosis not present

## 2022-11-28 NOTE — Patient Instructions (Signed)
Medication Instructions:   Your physician recommends that you continue on your current medications as directed. Please refer to the Current Medication list given to you today.  *If you need a refill on your cardiac medications before your next appointment, please call your pharmacy*   Lab Work:  None Ordered  If you have labs (blood work) drawn today and your tests are completely normal, you will receive your results only by: MyChart Message (if you have MyChart) OR A paper copy in the mail If you have any lab test that is abnormal or we need to change your treatment, we will call you to review the results.   Testing/Procedures:  None ordered   Follow-Up: At [facility], you and your health needs are our priority.  As part of our continuing mission to provide you with exceptional heart care, we have created designated Provider Care Teams.  These Care Teams include your primary Cardiologist (physician) and Advanced Practice Providers (APPs -  Physician Assistants and Nurse Practitioners) who all work together to provide you with the care you need, when you need it.  We recommend signing up for the patient portal called "MyChart".  Sign up information is provided on this After Visit Summary.  MyChart is used to connect with patients for Virtual Visits (Telemedicine).  Patients are able to view lab/test results, encounter notes, upcoming appointments, etc.  Non-urgent messages can be sent to your provider as well.   To learn more about what you can do with MyChart, go to [email or web address].    Your next appointment:  AS NEEDED   ' 

## 2022-11-28 NOTE — Progress Notes (Signed)
Cardiology Office Note:    Date:  11/28/2022   ID:  Morgan Sanders, DOB 08-09-1976, MRN [id number]  PCP:  Gweneth Dimitri, MD   [facility] Providers Cardiologist:  Debbe Odea, MD     Referring MD: Gweneth Dimitri, MD   Chief Complaint  Patient presents with   Follow-up    Discuss echo results.  Patient denies new or acute cardiac problems/concerns today.     History of Present Illness:    Morgan Sanders is a 47 y.o. female with a hx of asthma, right breast cancer s/p lumpectomy, former smoker x 20 years, currently engages in vaping who presents for follow-up.  Previously seen due to cardiac murmur and palpitations.  Cardiac monitor was placed to evaluate any significant arrhythmia.  Echocardiogram obtained to evaluate any significant valvular abnormality.  Denies chest pain, no new concerns at this time.  Presents for cardiac testing results.  She still vapes, is working on quitting   Past Medical History:  Diagnosis Date   Allergy    Anemia    Asthma    Blood transfusion without reported diagnosis    Breast cancer    Chicken pox    Colon polyps    Depression    Heart murmur    Hypertension    Neuromuscular disorder    neuropathy   Pneumonia    Stomach ulcer     Past Surgical History:  Procedure Laterality Date   BREAST BIOPSY Right 01/01/2021   u/s bx 12:00 1 cmfn path pending x clip lateral   BREAST BIOPSY Right 01/01/2021   u/s bx 12:00 1cmfn path pending vision medial   BREAST LUMPECTOMY     BREAST LUMPECTOMY WITH SENTINEL LYMPH NODE BIOPSY Right 01/18/2021   Procedure: BREAST LUMPECTOMY WITH SENTINEL LYMPH NODE BX;  Surgeon: Earline Mayotte, MD;  Location: [facility];  Service: General;  Laterality: Right;   CHOLECYSTECTOMY     CHOLECYSTECTOMY, LAPAROSCOPIC  03/1996   COLONOSCOPY     ESOPHAGOGASTRODUODENOSCOPY (EGD) WITH PROPOFOL N/A 12/31/2018   Procedure: ESOPHAGOGASTRODUODENOSCOPY (EGD) WITH PROPOFOL;  Surgeon: Midge Minium, MD;   Location: [facility];  Service: Endoscopy;  Laterality: N/A;   TUBAL LIGATION     WISDOM TOOTH EXTRACTION      Current Medications: Current Meds  Medication Sig   albuterol (VENTOLIN HFA) 108 (90 Base) MCG/ACT inhaler INHALE 2 PUFFS INTO THE LUNGS EVERY 4 (FOUR) HOURS AS NEEDED FOR SHORTNESS OF BREATH OR WHEEZING   ALPRAZolam (XANAX) 0.5 MG tablet TAKE 1 TABLET (0.5 MG TOTAL) BY MOUTH DAILY AS NEEDED FOR ANXIETY.   budesonide-formoterol (SYMBICORT) 80-4.5 MCG/ACT inhaler Inhale 2 puffs into the lungs 2 (two) times daily. Rinse mouth out after each use   Cholecalciferol (VITAMIN D3) 125 MCG (5000 UT) CAPS Take 5,000 Units by mouth daily.   escitalopram (LEXAPRO) 20 MG tablet Take 1 tablet (20 mg total) by mouth daily.   famotidine (PEPCID) 20 MG tablet Take 20 mg by mouth 2 (two) times daily.   letrozole (FEMARA) 2.5 MG tablet TAKE 1 TABLET BY MOUTH EVERY DAY   montelukast (SINGULAIR) 10 MG tablet TAKE 1 TABLET BY MOUTH EVERYDAY AT BEDTIME   Potassium Chloride ER 20 MEQ TBCR TAKE 1 TABLET BY MOUTH EVERY DAY   pregabalin (LYRICA) 200 MG capsule Take 200 mg by mouth 2 (two) times daily.     Allergies:   Patient has no known allergies.   Social History   Socioeconomic History   Marital status: Married  Spouse name: Harvie Heck   Number of children: 2   Years of education: high school   Highest education level: 12th grade  Occupational History   Occupation: Golf/clubhouse attendent  Tobacco Use   Smoking status: Every Day    Types: E-cigarettes   Smokeless tobacco: Never   Tobacco comments:    Former cigarette smoker  Building services engineer Use: Some days   Substances: Nicotine, Flavoring  Substance and Sexual Activity   Alcohol use: Yes    Comment: a few times a year   Drug use: Never   Sexual activity: Not Currently  Other Topics Concern   Not on file  Social History Narrative   02/07/20   From: the area   Living: with husband, Harvie Heck (2000)   Work: at a golf course       Family: daughters - CJ (lives at [place], 2 children) and Geologist, engineering (still living at home, in Social worker school)      Enjoys: golf       Exercise: golfing 3-4 times a week - alternating between cart/walking   Diet: pretty good - eggs/fruit, eats lunch and dinner      Safety   Seat belts: Yes    Guns: Yes  and secure   Safe in relationships: Yes    Social Determinants of Health   Financial Resource Strain: Low Risk  (01/07/2019)   Overall Financial Resource Strain (CARDIA)    Difficulty of Paying Living Expenses: Not hard at all  Food Insecurity: No Food Insecurity (01/07/2019)   Hunger Vital Sign    Worried About Running Out of Food in the Last Year: Never true    Ran Out of Food in the Last Year: Never true  Transportation Needs: No Transportation Needs (01/07/2019)   PRAPARE - Administrator, Civil Service (Medical): No    Lack of Transportation (Non-Medical): No  Physical Activity: Inactive (01/07/2019)   Exercise Vital Sign    Days of Exercise per Week: 0 days    Minutes of Exercise per Session: 0 min  Stress: No Stress Concern Present (01/07/2019)   [employer] of Occupational Health - Occupational Stress Questionnaire    Feeling of Stress : Not at all  Social Connections: Unknown (01/07/2019)   Social Connection and Isolation Panel [NHANES]    Frequency of Communication with Friends and Family: More than three times a week    Frequency of Social Gatherings with Friends and Family: More than three times a week    Attends Religious Services: More than 4 times per year    Active Member of [employer] or Organizations: Not on file    Attends Banker Meetings: Not on file    Marital Status: Not on file     Family History: The patient's family history includes Arthritis in her maternal grandmother and mother; Asthma in her mother; Bone cancer in her maternal great-grandmother; Breast cancer (age of onset: 59) in her maternal grandmother; Cancer in her maternal  grandmother; Cataracts in her paternal grandfather; Depression in her daughter, daughter, and mother; Diabetes in her maternal grandmother and mother; Early death in her father and maternal grandfather; Hearing loss in her maternal grandmother; Heart attack in her paternal grandmother; Hyperlipidemia in her mother and sister; Hypertension in her maternal grandmother, mother, and sister; Kidney disease in her mother; Liver disease in her father; Lung cancer in her paternal grandfather; Sudden death in her father.  ROS:   Please see the history of  present illness.     All other systems reviewed and are negative.  EKGs/Labs/Other Studies Reviewed:    The following studies were reviewed today:   EKG:  EKG not ordered today.   Recent Labs: 06/27/2022: ALT 14; BUN 14; Creat 1.00; Hemoglobin 12.5; Platelets 121; Potassium 4.4; Sodium 135; TSH 1.87  Recent Lipid Panel    Component Value Date/Time   CHOL 257 (H) 12/02/2021 1538   TRIG (H) 12/02/2021 1538    619.0 Triglyceride is over 400; calculations on Lipids are invalid.   HDL 43.00 12/02/2021 1538   CHOLHDL 6 12/02/2021 1538   LDLDIRECT 115.0 12/02/2021 1538     Risk Assessment/Calculations:         Physical Exam:    VS:  BP 130/72 (BP Location: Left Arm, Patient Position: Sitting, Cuff Size: Normal)   Pulse 86   Ht  (1.676 m)   Wt 189 lb (85.7 kg)   SpO2 97%   BMI 30.51 kg/m     Wt Readings from Last 3 Encounters:  11/28/22 189 lb (85.7 kg)  10/03/22 189 lb (85.7 kg)  08/27/22 189 lb (85.7 kg)     GEN:  Well nourished, well developed in no acute distress HEENT: Normal NECK: No JVD; No carotid bruits CARDIAC: RRR, 2/6 systolic murmur RESPIRATORY: Expiratory wheezing noted. ABDOMEN: Soft, non-tender, non-distended MUSCULOSKELETAL:  No edema; No deformity  SKIN: Warm and dry NEUROLOGIC:  Alert and oriented x 3 PSYCHIATRIC:  Normal affect   ASSESSMENT:    1. Palpitations   2. Systolic murmur   3. Engages in  vaping   4. Pure hypertriglyceridemia    PLAN:    In order of problems listed above:  Palpitations, cardiac monitor showed occasional paroxysmal SVT, no evidence for A-fib or flutter.  No significant sustained arrhythmias.  EF 55 to 60%. Systolic murmur, echo with aortic valve stenosis, no significant stenosis.  Sclerosis likely reason for patient's heart murmur. Engages in vaping, cessation advised. Hypertriglyceridemia, plans to obtain fasting lipid profile in 2 weeks with PCP with plans to manage if elevated.  Recommend starting Zetia if triglycerides elevated.  Follow-up as needed.     Medication Adjustments/Labs and Tests Ordered: Current medicines are reviewed at length with the patient today.  Concerns regarding medicines are outlined above.  No orders of the defined types were placed in this encounter.  No orders of the defined types were placed in this encounter.   Patient Instructions  Medication Instructions:   Your physician recommends that you continue on your current medications as directed. Please refer to the Current Medication list given to you today.  *If you need a refill on your cardiac medications before your next appointment, please call your pharmacy*   Lab Work:  None Ordered  If you have labs (blood work) drawn today and your tests are completely normal, you will receive your results only by: MyChart Message (if you have MyChart) OR A paper copy in the mail If you have any lab test that is abnormal or we need to change your treatment, we will call you to review the results.   Testing/Procedures:  None ordered   Follow-Up: At [facility], you and your health needs are our priority.  As part of our continuing mission to provide you with exceptional heart care, we have created designated Provider Care Teams.  These Care Teams include your primary Cardiologist (physician) and Advanced Practice Providers (APPs -  Physician Assistants and  Nurse Practitioners) who all  work together to provide you with the care you need, when you need it.  We recommend signing up for the patient portal called "MyChart".  Sign up information is provided on this After Visit Summary.  MyChart is used to connect with patients for Virtual Visits (Telemedicine).  Patients are able to view lab/test results, encounter notes, upcoming appointments, etc.  Non-urgent messages can be sent to your provider as well.   To learn more about what you can do with MyChart, go to [email or web address].    Your next appointment:    AS NEEDED   Signed, Debbe Odea, MD  11/28/2022 3:25 PM    [facility]

## 2022-12-03 ENCOUNTER — Other Ambulatory Visit: Payer: Self-pay

## 2022-12-03 DIAGNOSIS — G629 Polyneuropathy, unspecified: Secondary | ICD-10-CM

## 2022-12-03 MED ORDER — POTASSIUM CHLORIDE ER 20 MEQ PO TBCR: 1.0000 | EXTENDED_RELEASE_TABLET | Freq: Every day | ORAL | 1 refills | Status: DC

## 2022-12-03 NOTE — Telephone Encounter (Signed)
   Patient has TOC appointment with Dr. Ermalene Searing on 6/25. Labs checked by her last in November. Ok to refill as pended?

## 2022-12-09 ENCOUNTER — Other Ambulatory Visit: Payer: Self-pay

## 2022-12-09 DIAGNOSIS — F331 Major depressive disorder, recurrent, moderate: Secondary | ICD-10-CM

## 2022-12-09 DIAGNOSIS — Z23 Encounter for immunization: Secondary | ICD-10-CM

## 2022-12-09 NOTE — Telephone Encounter (Signed)
Prescription Request  12/09/2022  LOV: 06/27/2022 acute after fall; 12/02/21 annual exam  What is the name of the medication or equipment? Escitalopram 20 mg  Last refill# 90 x 3 on 12/02/21  Have you contacted your pharmacy to request a refill? Yes   Which pharmacy would you like this sent to?  [id number] [place], [place] - [address] AT [facility] [address] [place] [address] Phone: [phone] Fax: [phone]   Patient notified that their request is being sent to the clinical staff for review and that they should receive a response within 2 business days.   Please advise at  [facility]  Pt has TOC scheduled with [name] NP on 12/16/22.  Sending note to Worthy Rancher FNP. Thank you.

## 2022-12-10 MED ORDER — ESCITALOPRAM OXALATE 20 MG PO TABS: 20.0000 mg | ORAL_TABLET | Freq: Every day | ORAL | 0 refills | Status: DC

## 2022-12-16 ENCOUNTER — Ambulatory Visit (INDEPENDENT_AMBULATORY_CARE_PROVIDER_SITE_OTHER): Payer: BC Managed Care – PPO | Admitting: Nurse Practitioner

## 2022-12-16 ENCOUNTER — Encounter: Payer: Self-pay | Admitting: Nurse Practitioner

## 2022-12-16 VITALS — BP 132/88 | HR 79 | Temp 99.1°F | Ht 65.5 in | Wt 189.2 lb

## 2022-12-16 DIAGNOSIS — Z170 Estrogen receptor positive status [ER+]: Secondary | ICD-10-CM

## 2022-12-16 DIAGNOSIS — C50911 Malignant neoplasm of unspecified site of right female breast: Secondary | ICD-10-CM

## 2022-12-16 DIAGNOSIS — F331 Major depressive disorder, recurrent, moderate: Secondary | ICD-10-CM

## 2022-12-16 DIAGNOSIS — J4540 Moderate persistent asthma, uncomplicated: Secondary | ICD-10-CM

## 2022-12-16 DIAGNOSIS — F411 Generalized anxiety disorder: Secondary | ICD-10-CM

## 2022-12-16 DIAGNOSIS — K219 Gastro-esophageal reflux disease without esophagitis: Secondary | ICD-10-CM

## 2022-12-16 MED ORDER — ESCITALOPRAM OXALATE 20 MG PO TABS: 20.0000 mg | ORAL_TABLET | Freq: Every day | ORAL | 3 refills | Status: DC

## 2022-12-16 MED ORDER — MONTELUKAST SODIUM 10 MG PO TABS: ORAL_TABLET | ORAL | 3 refills | Status: DC

## 2022-12-16 MED ORDER — ALPRAZOLAM 0.5 MG PO TABS: ORAL_TABLET | ORAL | 5 refills | Status: DC

## 2022-12-16 NOTE — Assessment & Plan Note (Signed)
Stable. Mammogram scheduled in May.  Follow up with Oncology scheduled in July. Continue Letrozole 2.5 mg daily.

## 2022-12-16 NOTE — Assessment & Plan Note (Signed)
Chronic. Stable on Lexapro 20 mg daily. Continue. Refills sent. Encouraged to contact if worsening symptoms, unusual behavior changes or suicidal thoughts occur.

## 2022-12-16 NOTE — Assessment & Plan Note (Signed)
Chronic. Stable on Xanax 0.5 mg daily PRN and Lexapro 20 mg daily. Continue. Refills sent. Non-Opioid Controlled Substance Agreement signed today in office. PDMP reviewed.

## 2022-12-16 NOTE — Assessment & Plan Note (Signed)
Chronic. Stable on Singulair 10 mg daily, Symbicort 2 puffs twice daily and Albuterol PRN. Continue. Refills sent. Rarely having to use albuterol. Significant improvement in symptoms since starting Symbicort. Will continue to monitor.

## 2022-12-16 NOTE — Progress Notes (Signed)
[name], NP-C Phone: [phone]  Morgan Sanders is a 47 y.o. female who presents today for transfer of care. She is followed by Cardiology and Oncology. She has an appointment with GI in August. She has no complaints or new concerns today. She is doing well on her medications and requesting refills. She would like to return for her annual exam and fasting lab work.   Asthma- Well managed at this time on current medication regimen. Taking Singulair 10 mg daily, Symbicort 2 puffs twice daily and Albuterol PRN. Reports she uses her albuterol inhaler less than once a month. She has had significant improvement in her symptoms since starting the Symbicort. Denies wheezing. Denies shortness of breath.   Anxiety/Depression- Symptoms well managed at this time on Lexapro 20 mg daily and Xanax 0.5 mg daily PRN. PHQ- 3 and GAD- 1. Denies SI/HI. Not seeing Psychiatry or a therapist, does not feel that she needs to at this time.   GERD:   Reflux symptoms: None with medication   Abd pain: No   Blood in stool: No  Dysphagia: No   EGD: 2020  Medication: Pepcid 20 mg twice daily and diet control   Social History   Tobacco Use  Smoking Status Every Day   Types: E-cigarettes  Smokeless Tobacco Never  Tobacco Comments   Former cigarette smoker    Current Outpatient Medications on File Prior to Visit  Medication Sig Dispense Refill   albuterol (VENTOLIN HFA) 108 (90 Base) MCG/ACT inhaler INHALE 2 PUFFS INTO THE LUNGS EVERY 4 (FOUR) HOURS AS NEEDED FOR SHORTNESS OF BREATH OR WHEEZING 18 each 1   budesonide-formoterol (SYMBICORT) 80-4.5 MCG/ACT inhaler Inhale 2 puffs into the lungs 2 (two) times daily. Rinse mouth out after each use 1 each 12   Cholecalciferol (VITAMIN D3) 125 MCG (5000 UT) CAPS Take 5,000 Units by mouth daily.     famotidine (PEPCID) 20 MG tablet Take 20 mg by mouth 2 (two) times daily.     letrozole (FEMARA) 2.5 MG tablet TAKE 1 TABLET BY MOUTH EVERY DAY 30 tablet 3   Potassium  Chloride ER 20 MEQ TBCR Take 1 tablet (20 mEq total) by mouth daily. 90 tablet 1   pregabalin (LYRICA) 200 MG capsule Take 200 mg by mouth 2 (two) times daily.     No current facility-administered medications on file prior to visit.    ROS see history of present illness  Objective  Physical Exam Vitals:   12/16/22 1344  BP: 132/88  Pulse: 79  Temp: 99.1 F (37.3 C)  SpO2: 98%    BP Readings from Last 3 Encounters:  12/16/22 132/88  11/28/22 130/72  10/03/22 128/84   Wt Readings from Last 3 Encounters:  12/16/22 189 lb 3.2 oz (85.8 kg)  11/28/22 189 lb (85.7 kg)  10/03/22 189 lb (85.7 kg)    Physical Exam Constitutional:      General: She is not in acute distress.    Appearance: Normal appearance.  HENT:     Head: Normocephalic.  Cardiovascular:     Rate and Rhythm: Normal rate and regular rhythm.     Heart sounds: Normal heart sounds.  Pulmonary:     Effort: Pulmonary effort is normal.     Breath sounds: Normal breath sounds.  Skin:    General: Skin is warm and dry.  Neurological:     General: No focal deficit present.     Mental Status: She is alert.  Psychiatric:  Mood and Affect: Mood normal.        Behavior: Behavior normal.    Assessment/Plan: Please see individual problem list.  Moderate episode of recurrent major depressive disorder (HCC) Assessment & Plan: Chronic. Stable on Lexapro 20 mg daily. Continue. Refills sent. Encouraged to contact if worsening symptoms, unusual behavior changes or suicidal thoughts occur.   Orders: -     Escitalopram Oxalate; Take 1 tablet (20 mg total) by mouth daily.  Dispense: 90 tablet; Refill: 3  Generalized anxiety disorder Assessment & Plan: Chronic. Stable on Xanax 0.5 mg daily PRN and Lexapro 20 mg daily. Continue. Refills sent. Non-Opioid Controlled Substance Agreement signed today in office. PDMP reviewed.   Orders: -     ALPRAZolam; TAKE 1 TABLET (0.5 MG TOTAL) BY MOUTH DAILY AS NEEDED FOR ANXIETY.   Dispense: 30 tablet; Refill: 5  Moderate persistent asthma without complication Assessment & Plan: Chronic. Stable on Singulair 10 mg daily, Symbicort 2 puffs twice daily and Albuterol PRN. Continue. Refills sent. Rarely having to use albuterol. Significant improvement in symptoms since starting Symbicort. Will continue to monitor.   Orders: -     Montelukast Sodium; TAKE 1 TABLET BY MOUTH EVERYDAY AT BEDTIME  Dispense: 90 tablet; Refill: 3  Malignant neoplasm of right breast, stage 1, estrogen receptor positive (HCC) Assessment & Plan: Stable. Mammogram scheduled in May.  Follow up with Oncology scheduled in July. Continue Letrozole 2.5 mg daily.    Gastroesophageal reflux disease, unspecified whether esophagitis present Assessment & Plan: Chronic. Stable on Pepcid 20 mg twice daily and diet control. Continue. Patient aware of and avoids dietary triggers that worsen symptoms such as fried foods.    Return for Annual Exam.   [name], NP-C  Primary Care - [facility]

## 2022-12-16 NOTE — Assessment & Plan Note (Signed)
Chronic. Stable on Pepcid 20 mg twice daily and diet control. Continue. Patient aware of and avoids dietary triggers that worsen symptoms such as fried foods.

## 2022-12-25 ENCOUNTER — Telehealth: Payer: Self-pay | Admitting: *Deleted

## 2022-12-25 ENCOUNTER — Ambulatory Visit
Admission: RE | Admit: 2022-12-25 | Discharge: 2022-12-25 | Disposition: A | Discharge status: Home / Self Care | Payer: BC Managed Care – PPO | Source: Ambulatory Visit | Attending: Oncology | Admitting: Oncology

## 2022-12-25 DIAGNOSIS — Z170 Estrogen receptor positive status [ER+]: Secondary | ICD-10-CM | POA: Diagnosis not present

## 2022-12-25 DIAGNOSIS — Z79811 Long term (current) use of aromatase inhibitors: Secondary | ICD-10-CM | POA: Diagnosis not present

## 2022-12-25 DIAGNOSIS — Z853 Personal history of malignant neoplasm of breast: Secondary | ICD-10-CM | POA: Insufficient documentation

## 2022-12-25 DIAGNOSIS — Z08 Encounter for follow-up examination after completed treatment for malignant neoplasm: Secondary | ICD-10-CM | POA: Diagnosis not present

## 2022-12-25 DIAGNOSIS — R92323 Mammographic fibroglandular density, bilateral breasts: Secondary | ICD-10-CM | POA: Diagnosis not present

## 2022-12-25 DIAGNOSIS — C50911 Malignant neoplasm of unspecified site of right female breast: Secondary | ICD-10-CM | POA: Diagnosis not present

## 2022-12-25 HISTORY — DX: Personal history of irradiation: Z92.3

## 2022-12-25 NOTE — Telephone Encounter (Signed)
Pt had TOC appt in April & coming in next week for labs prior to CPE.  Please place future lab orders.

## 2022-12-26 ENCOUNTER — Other Ambulatory Visit: Payer: Self-pay | Admitting: Nurse Practitioner

## 2022-12-26 DIAGNOSIS — E559 Vitamin D deficiency, unspecified: Secondary | ICD-10-CM

## 2022-12-26 DIAGNOSIS — E531 Pyridoxine deficiency: Secondary | ICD-10-CM

## 2022-12-26 DIAGNOSIS — R7309 Other abnormal glucose: Secondary | ICD-10-CM

## 2022-12-26 DIAGNOSIS — Z0000 Encounter for general adult medical examination without abnormal findings: Secondary | ICD-10-CM

## 2022-12-26 DIAGNOSIS — E519 Thiamine deficiency, unspecified: Secondary | ICD-10-CM

## 2022-12-26 DIAGNOSIS — K7031 Alcoholic cirrhosis of liver with ascites: Secondary | ICD-10-CM

## 2022-12-26 DIAGNOSIS — Z13220 Encounter for screening for lipoid disorders: Secondary | ICD-10-CM

## 2022-12-26 DIAGNOSIS — F331 Major depressive disorder, recurrent, moderate: Secondary | ICD-10-CM

## 2022-12-26 DIAGNOSIS — E538 Deficiency of other specified B group vitamins: Secondary | ICD-10-CM

## 2022-12-26 DIAGNOSIS — Z1329 Encounter for screening for other suspected endocrine disorder: Secondary | ICD-10-CM

## 2022-12-30 ENCOUNTER — Telehealth: Payer: Self-pay

## 2022-12-30 ENCOUNTER — Other Ambulatory Visit (INDEPENDENT_AMBULATORY_CARE_PROVIDER_SITE_OTHER): Payer: BC Managed Care – PPO

## 2022-12-30 DIAGNOSIS — Z1329 Encounter for screening for other suspected endocrine disorder: Secondary | ICD-10-CM

## 2022-12-30 DIAGNOSIS — K7031 Alcoholic cirrhosis of liver with ascites: Secondary | ICD-10-CM | POA: Diagnosis not present

## 2022-12-30 DIAGNOSIS — E519 Thiamine deficiency, unspecified: Secondary | ICD-10-CM

## 2022-12-30 DIAGNOSIS — Z13220 Encounter for screening for lipoid disorders: Secondary | ICD-10-CM

## 2022-12-30 DIAGNOSIS — E538 Deficiency of other specified B group vitamins: Secondary | ICD-10-CM | POA: Diagnosis not present

## 2022-12-30 DIAGNOSIS — R7309 Other abnormal glucose: Secondary | ICD-10-CM | POA: Diagnosis not present

## 2022-12-30 DIAGNOSIS — Z0000 Encounter for general adult medical examination without abnormal findings: Secondary | ICD-10-CM

## 2022-12-30 DIAGNOSIS — E531 Pyridoxine deficiency: Secondary | ICD-10-CM

## 2022-12-30 LAB — CBC WITH DIFFERENTIAL/PLATELET
Basophils Absolute: 0 K/uL (ref 0.0–0.1)
Basophils Relative: 0.7 % (ref 0.0–3.0)
Eosinophils Absolute: 0.1 K/uL (ref 0.0–0.7)
Eosinophils Relative: 1.7 % (ref 0.0–5.0)
HCT: 39.8 % (ref 36.0–46.0)
Hemoglobin: 13.4 g/dL (ref 12.0–15.0)
Lymphocytes Relative: 35 % (ref 12.0–46.0)
Lymphs Abs: 1.3 K/uL (ref 0.7–4.0)
MCHC: 33.8 g/dL (ref 30.0–36.0)
MCV: 98.1 fl (ref 78.0–100.0)
Monocytes Absolute: 0.3 K/uL (ref 0.1–1.0)
Monocytes Relative: 6.7 % (ref 3.0–12.0)
Neutro Abs: 2.1 K/uL (ref 1.4–7.7)
Neutrophils Relative %: 55.9 % (ref 43.0–77.0)
Platelets: 124 K/uL — ABNORMAL LOW (ref 150.0–400.0)
RBC: 4.06 Mil/uL (ref 3.87–5.11)
RDW: 12.3 % (ref 11.5–15.5)
WBC: 3.8 K/uL — ABNORMAL LOW (ref 4.0–10.5)

## 2022-12-30 LAB — HEMOGLOBIN A1C: Hgb A1c MFr Bld: 5.6 % (ref 4.6–6.5)

## 2022-12-30 LAB — LIPID PANEL
Cholesterol: 270 mg/dL — ABNORMAL HIGH (ref 0–200)
HDL: 36.4 mg/dL — ABNORMAL LOW (ref >39.00)
Total CHOL/HDL Ratio: 7
Triglycerides: 940 mg/dL — ABNORMAL HIGH (ref 0.0–149.0)

## 2022-12-30 LAB — VITAMIN B12: Vitamin B-12: 602 pg/mL (ref 211–911)

## 2022-12-30 LAB — HEPATIC FUNCTION PANEL
ALT: 17 U/L (ref 0–35)
AST: 18 U/L (ref 0–37)
Albumin: 4.3 g/dL (ref 3.5–5.2)
Alkaline Phosphatase: 70 U/L (ref 39–117)
Bilirubin, Direct: 0.1 mg/dL (ref 0.0–0.3)
Total Bilirubin: 0.4 mg/dL (ref 0.2–1.2)
Total Protein: 7.1 g/dL (ref 6.0–8.3)

## 2022-12-30 LAB — BASIC METABOLIC PANEL
BUN: 12 mg/dL (ref 6–23)
CO2: 26 mEq/L (ref 19–32)
Calcium: 9.2 mg/dL (ref 8.4–10.5)
Chloride: 105 mEq/L (ref 96–112)
Creatinine, Ser: 0.98 mg/dL (ref 0.40–1.20)
GFR: 68.29 mL/min (ref >60.00)
Glucose, Bld: 101 mg/dL — ABNORMAL HIGH (ref 70–99)
Potassium: 4.8 mEq/L (ref 3.5–5.1)
Sodium: 140 mEq/L (ref 135–145)

## 2022-12-30 LAB — LDL CHOLESTEROL, DIRECT: Direct LDL: 92 mg/dL

## 2022-12-30 LAB — TSH: TSH: 1.23 u[IU]/mL (ref 0.35–5.50)

## 2022-12-30 NOTE — Telephone Encounter (Signed)
LMOM for pt to CB in regards to labs 

## 2022-12-31 ENCOUNTER — Encounter: Payer: Self-pay | Admitting: Nurse Practitioner

## 2022-12-31 NOTE — Telephone Encounter (Signed)
Patient returned office phone call for lab results. 

## 2022-12-31 NOTE — Telephone Encounter (Signed)
Called pt and went over labs.  

## 2023-01-01 ENCOUNTER — Other Ambulatory Visit: Payer: Self-pay | Admitting: Nurse Practitioner

## 2023-01-01 DIAGNOSIS — E782 Mixed hyperlipidemia: Secondary | ICD-10-CM | POA: Insufficient documentation

## 2023-01-01 DIAGNOSIS — E781 Pure hyperglyceridemia: Secondary | ICD-10-CM

## 2023-01-01 DIAGNOSIS — E669 Obesity, unspecified: Secondary | ICD-10-CM

## 2023-01-01 MED ORDER — FENOFIBRATE 145 MG PO TABS
145.0000 mg | ORAL_TABLET | Freq: Every day | ORAL | 3 refills | Status: DC
Start: 2023-01-01 — End: 2024-01-18

## 2023-01-03 LAB — VITAMIN B6: Vitamin B6: 9.9 ng/mL (ref 2.1–21.7)

## 2023-01-03 LAB — VITAMIN B1: Vitamin B1 (Thiamine): 7 nmol/L — ABNORMAL LOW (ref 8–30)

## 2023-01-09 ENCOUNTER — Other Ambulatory Visit (HOSPITAL_COMMUNITY)
Admission: RE | Admit: 2023-01-09 | Discharge: 2023-01-09 | Disposition: A | Discharge status: Home / Self Care | Payer: BC Managed Care – PPO | Source: Ambulatory Visit | Attending: Nurse Practitioner | Admitting: Nurse Practitioner

## 2023-01-09 ENCOUNTER — Encounter: Payer: Self-pay | Admitting: Nurse Practitioner

## 2023-01-09 ENCOUNTER — Ambulatory Visit (INDEPENDENT_AMBULATORY_CARE_PROVIDER_SITE_OTHER): Payer: BC Managed Care – PPO | Admitting: Nurse Practitioner

## 2023-01-09 VITALS — BP 110/76 | HR 75 | Temp 98.6°F | Ht 65.5 in | Wt 186.6 lb

## 2023-01-09 DIAGNOSIS — C50911 Malignant neoplasm of unspecified site of right female breast: Secondary | ICD-10-CM | POA: Diagnosis not present

## 2023-01-09 DIAGNOSIS — E782 Mixed hyperlipidemia: Secondary | ICD-10-CM | POA: Diagnosis not present

## 2023-01-09 DIAGNOSIS — E519 Thiamine deficiency, unspecified: Secondary | ICD-10-CM | POA: Diagnosis not present

## 2023-01-09 DIAGNOSIS — Z0000 Encounter for general adult medical examination without abnormal findings: Secondary | ICD-10-CM | POA: Diagnosis not present

## 2023-01-09 DIAGNOSIS — Z124 Encounter for screening for malignant neoplasm of cervix: Secondary | ICD-10-CM

## 2023-01-09 DIAGNOSIS — R87610 Atypical squamous cells of undetermined significance on cytologic smear of cervix (ASC-US): Secondary | ICD-10-CM | POA: Diagnosis not present

## 2023-01-09 DIAGNOSIS — Z170 Estrogen receptor positive status [ER+]: Secondary | ICD-10-CM

## 2023-01-09 DIAGNOSIS — Z01419 Encounter for gynecological examination (general) (routine) without abnormal findings: Secondary | ICD-10-CM | POA: Insufficient documentation

## 2023-01-09 NOTE — Progress Notes (Unsigned)
Morgan Dicker, NP-C Phone: [phone]  Morgan Sanders is a 47 y.o. female who presents today for annual exam. She has no complaints or new concerns today. She completed lab work prior to her appointment and would like to review the results.   Diet: Has been working on diet, cut out juice and soda recently, no fried foods Exercise: Golf at least 2 times per week Pap smear: 03/02/2018- Due! Colonoscopy: 08/09/2018- 3 year recall- Due!  Mammogram: 12/25/2022 Family history-  Colon cancer: No  Breast cancer: Yes- Maternal grandmother and 2 great aunts  Ovarian cancer: Yes- Paternal aunt Patient has been referred to genetics in the past and declined testing due to cost.  Vaccines-   Flu: Not due  Tetanus: 12/02/2021  COVID19: x 2 HIV screening: Negative Hep C Screening: Negative Tobacco use: No, she does vape nicotine daily. One cartridge lasts her approx. 1.5 weeks Alcohol use: No Illicit Drug use: No Dentist: No- she has dentures Ophthalmology: Yes  [id number] Wt Readings from Last 3 Encounters:  01/09/23 186 lb 9.6 oz (84.6 kg)  12/16/22 189 lb 3.2 oz (85.8 kg)  11/28/22 189 lb (85.7 kg)   Last period:  Post-menopausal Regular periods: No Heavy bleeding: No  Sexually active: Yes Birth control or hormonal therapy: Yes- Letrozole Hx of STD: Patient desires STD screening Dyspareunia: No Hot flashes: No Vaginal discharge: No Dysuria: No   Breast mass or concerns: No History of abnormal pap: No   Social History   Tobacco Use  Smoking Status Former   Packs/day: 0.00   Years: 20.00   Additional pack years: 0.00   Total pack years: 0.00   Types: Cigarettes   Quit date: 08/18/2018   Years since quitting: 4.4  Smokeless Tobacco Never  Tobacco Comments   Former cigarette smoker    Current Outpatient Medications on File Prior to Visit  Medication Sig Dispense Refill   albuterol (VENTOLIN HFA) 108 (90 Base) MCG/ACT inhaler INHALE 2 PUFFS INTO THE LUNGS EVERY 4 (FOUR)  HOURS AS NEEDED FOR SHORTNESS OF BREATH OR WHEEZING 18 each 1   ALPRAZolam (XANAX) 0.5 MG tablet TAKE 1 TABLET (0.5 MG TOTAL) BY MOUTH DAILY AS NEEDED FOR ANXIETY. 30 tablet 5   budesonide-formoterol (SYMBICORT) 80-4.5 MCG/ACT inhaler Inhale 2 puffs into the lungs 2 (two) times daily. Rinse mouth out after each use 1 each 12   Cholecalciferol (VITAMIN D3) 125 MCG (5000 UT) CAPS Take 5,000 Units by mouth daily.     escitalopram (LEXAPRO) 20 MG tablet Take 1 tablet (20 mg total) by mouth daily. 90 tablet 3   famotidine (PEPCID) 20 MG tablet Take 20 mg by mouth 2 (two) times daily.     fenofibrate (TRICOR) 145 MG tablet Take 1 tablet (145 mg total) by mouth daily. 90 tablet 3   letrozole (FEMARA) 2.5 MG tablet TAKE 1 TABLET BY MOUTH EVERY DAY 30 tablet 3   montelukast (SINGULAIR) 10 MG tablet TAKE 1 TABLET BY MOUTH EVERYDAY AT BEDTIME 90 tablet 3   Potassium Chloride ER 20 MEQ TBCR Take 1 tablet (20 mEq total) by mouth daily. 90 tablet 1   pregabalin (LYRICA) 200 MG capsule Take 200 mg by mouth 2 (two) times daily.     No current facility-administered medications on file prior to visit.    ROS see history of present illness  Objective  Physical Exam Vitals:   01/09/23 0826  BP: 110/76  Pulse: 75  Temp: 98.6 F (37 C)  SpO2: 98%  BP Readings from Last 3 Encounters:  01/09/23 110/76  12/16/22 132/88  11/28/22 130/72   Wt Readings from Last 3 Encounters:  01/09/23 186 lb 9.6 oz (84.6 kg)  12/16/22 189 lb 3.2 oz (85.8 kg)  11/28/22 189 lb (85.7 kg)    Physical Exam Exam conducted with a chaperone present Donavan Foil, CMA).  Constitutional:      General: She is not in acute distress.    Appearance: Normal appearance.  HENT:     Head: Normocephalic.     Right Ear: Tympanic membrane normal.     Left Ear: Tympanic membrane normal.     Nose: Nose normal.     Mouth/Throat:     Mouth: Mucous membranes are moist.     Pharynx: Oropharynx is clear.  Eyes:      Conjunctiva/sclera: Conjunctivae normal.     Pupils: Pupils are equal, round, and reactive to light.  Neck:     Thyroid: No thyromegaly.  Cardiovascular:     Rate and Rhythm: Normal rate and regular rhythm.     Heart sounds: Normal heart sounds.  Pulmonary:     Effort: Pulmonary effort is normal.     Breath sounds: Normal breath sounds.  Abdominal:     General: Abdomen is flat. Bowel sounds are normal.     Palpations: Abdomen is soft. There is no mass.     Tenderness: There is no abdominal tenderness.  Genitourinary:    General: Normal vulva.     Pubic Area: No rash.      Labia:        Right: No rash or lesion.        Left: No rash or lesion.      Vagina: Normal. No vaginal discharge, tenderness or bleeding.     Cervix: Normal.     Uterus: Normal.   Musculoskeletal:        General: Normal range of motion.  Lymphadenopathy:     Cervical: No cervical adenopathy.  Skin:    General: Skin is warm and dry.     Findings: No rash.  Neurological:     General: No focal deficit present.     Mental Status: She is alert.  Psychiatric:        Mood and Affect: Mood normal.        Behavior: Behavior normal.    Assessment/Plan: Please see individual problem list.  Well woman exam with routine gynecological exam Assessment & Plan: Physical exam complete. Lab results reviewed with patient. Pap- today. Colonoscopy- due, patient already has appointment with GI scheduled in August. Mammo- UTD. Flu vaccine not due. Tetanus vaccine- UTD. Declined additional COVID vaccines. HIV/Hep C screenings negative. Counseled on vaping cessation. Recommended follow up with Ophthalmology for annual exam. Encouraged to continue working on healthy diet and exercising. Return to care in 6 months, sooner PRN.    Mixed hyperlipidemia Assessment & Plan: Started on Fenofibrate 145 mg daily on 01/01/2023. Encouraged healthy diet and exercise. Will continue to monitor.  Lab Results  Component Value Date   CHOL  270 (H) 12/30/2022   HDL 36.40 (L) 12/30/2022   LDLDIRECT 92.0 12/30/2022   TRIG (H) 12/30/2022    940.0 Triglyceride is over 400; calculations on Lipids are invalid.   CHOLHDL 7 12/30/2022      Malignant neoplasm of right breast, stage 1, estrogen receptor positive (HCC) Assessment & Plan: Recent mammogram stable. Continue Letrozole daily. Follow up with Oncology in July as scheduled.    Vitamin  B1 deficiency Assessment & Plan: Used to take supplement daily. Slightly decreased in most recent lab work, encouraged OTC B vitamin complex supplement. Will continue to monitor.    Screening for cervical cancer -     Cytology - PAP    Return in about 6 months (around 07/12/2023) for Follow up.   Morgan Dicker, NP-C  Primary Care - [facility]

## 2023-01-14 DIAGNOSIS — E519 Thiamine deficiency, unspecified: Secondary | ICD-10-CM | POA: Insufficient documentation

## 2023-01-14 NOTE — Assessment & Plan Note (Signed)
Started on Fenofibrate 145 mg daily on 01/01/2023. Encouraged healthy diet and exercise. Will continue to monitor.  Lab Results  Component Value Date   CHOL 270 (H) 12/30/2022   HDL 36.40 (L) 12/30/2022   LDLDIRECT 92.0 12/30/2022   TRIG (H) 12/30/2022    940.0 Triglyceride is over 400; calculations on Lipids are invalid.   CHOLHDL 7 12/30/2022

## 2023-01-14 NOTE — Assessment & Plan Note (Signed)
Physical exam complete. Lab results reviewed with patient. Pap- today. Colonoscopy- due, patient already has appointment with GI scheduled in August. Mammo- UTD. Flu vaccine not due. Tetanus vaccine- UTD. Declined additional COVID vaccines. HIV/Hep C screenings negative. Counseled on vaping cessation. Recommended follow up with Ophthalmology for annual exam. Encouraged to continue working on healthy diet and exercising. Return to care in 6 months, sooner PRN.

## 2023-01-14 NOTE — Assessment & Plan Note (Addendum)
Recent mammogram stable. Continue Letrozole daily. Follow up with Oncology in July as scheduled.

## 2023-01-14 NOTE — Assessment & Plan Note (Signed)
Used to take supplement daily. Slightly decreased in most recent lab work, encouraged OTC B vitamin complex supplement. Will continue to monitor.

## 2023-01-16 LAB — CYTOLOGY - PAP
Chlamydia: NEGATIVE
Comment: NEGATIVE
Comment: NEGATIVE
Comment: NEGATIVE
Comment: NORMAL
Diagnosis: UNDETERMINED — AB
High risk HPV: NEGATIVE
Neisseria Gonorrhea: NEGATIVE
Trichomonas: NEGATIVE

## 2023-01-20 ENCOUNTER — Other Ambulatory Visit: Payer: Self-pay | Admitting: Nurse Practitioner

## 2023-01-20 DIAGNOSIS — B9689 Other specified bacterial agents as the cause of diseases classified elsewhere: Secondary | ICD-10-CM

## 2023-01-20 MED ORDER — METRONIDAZOLE 500 MG PO TABS
500.0000 mg | ORAL_TABLET | Freq: Two times a day (BID) | ORAL | 0 refills | Status: AC
Start: 2023-01-20 — End: 2023-01-27

## 2023-01-25 ENCOUNTER — Other Ambulatory Visit: Payer: Self-pay | Admitting: Oncology

## 2023-02-10 ENCOUNTER — Encounter: Payer: BC Managed Care – PPO | Admitting: Family Medicine

## 2023-02-11 IMAGING — US US BREAST*R* LIMITED INC AXILLA
1 series · 13 of 13 positions shown · non-contrast
Comparison: Prior films

CLINICAL DATA: Callback from screening mammogram for possible mass
right breast

EXAM:
DIGITAL DIAGNOSTIC UNILATERAL RIGHT MAMMOGRAM WITH TOMOSYNTHESIS AND
CAD; ULTRASOUND RIGHT BREAST LIMITED
TECHNIQUE: Right digital diagnostic mammography and breast tomosynthesis was
performed. The images were evaluated with computer-aided detection.;
Targeted ultrasound examination of the right breast was performed

[Series 1: us breast*right* limited inc axilla · 0.06mm/px · 13 of 13 slices shown]
[im 1/13]
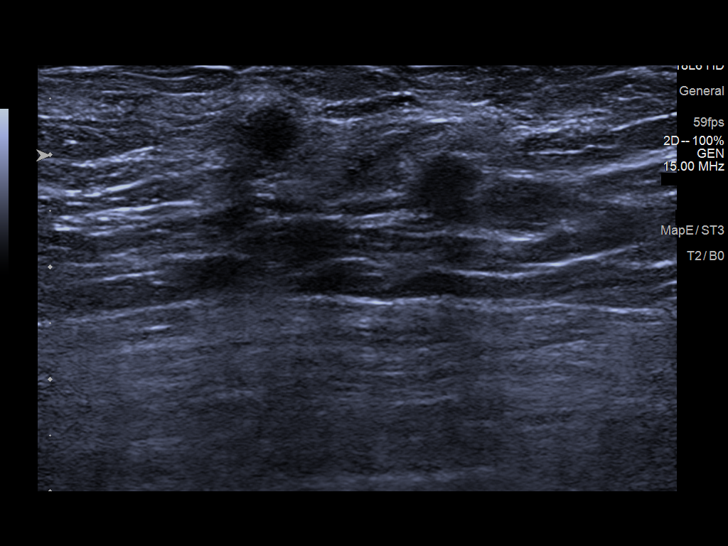
[im 2/13]
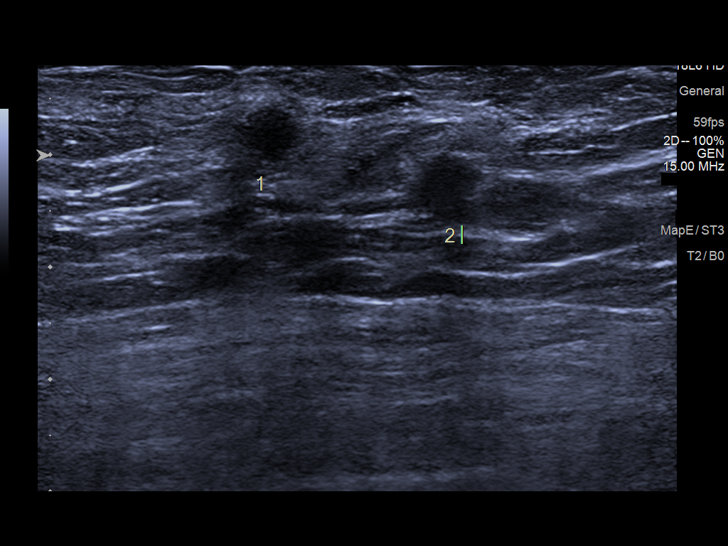
[im 3/13]
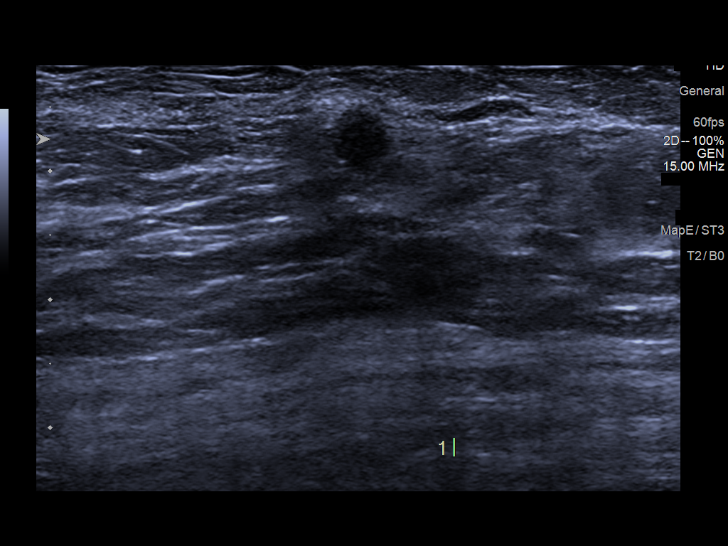
[im 4/13]
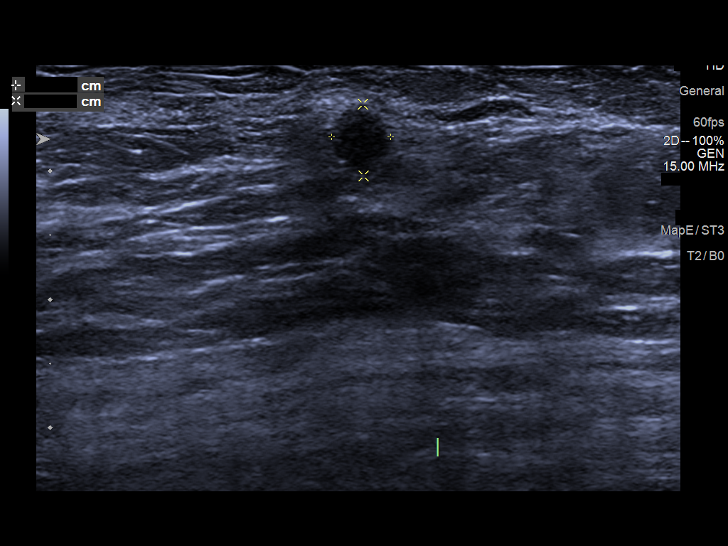
[im 5/13]
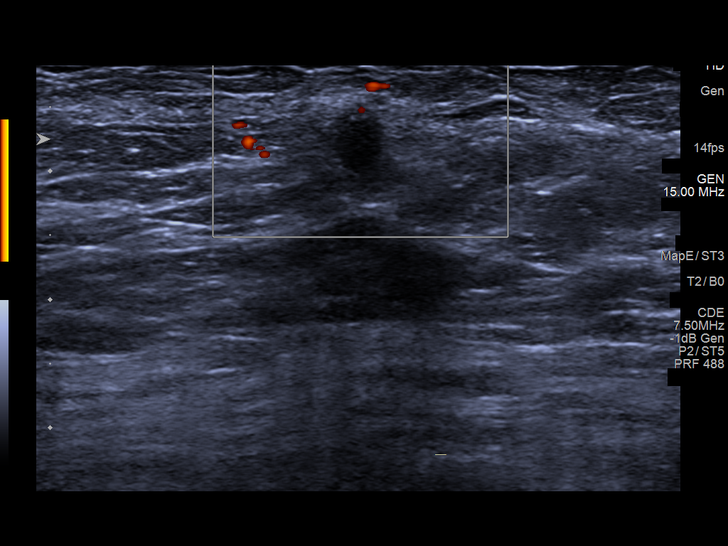
[im 6/13]
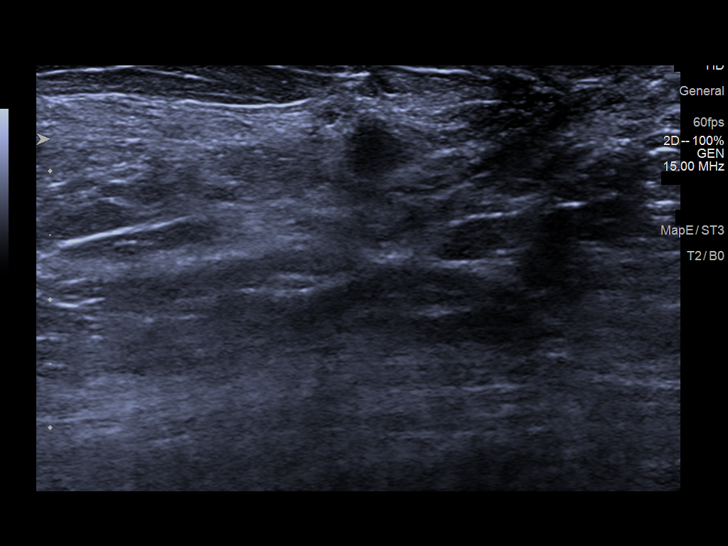
[im 7/13]
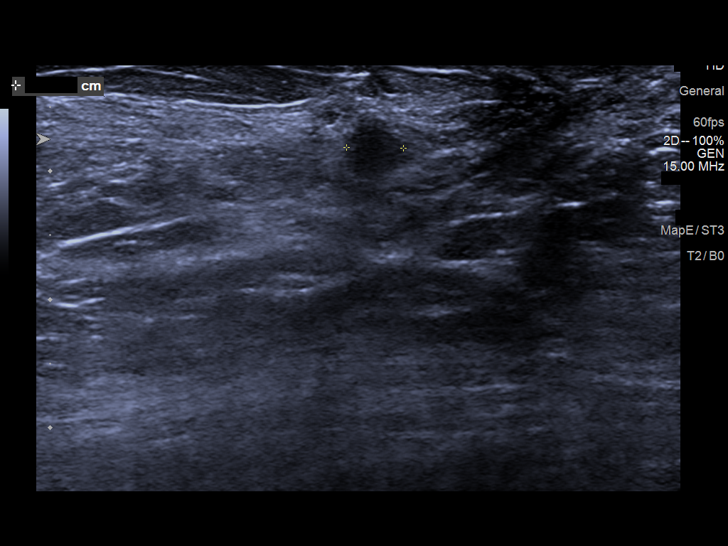
[im 8/13]
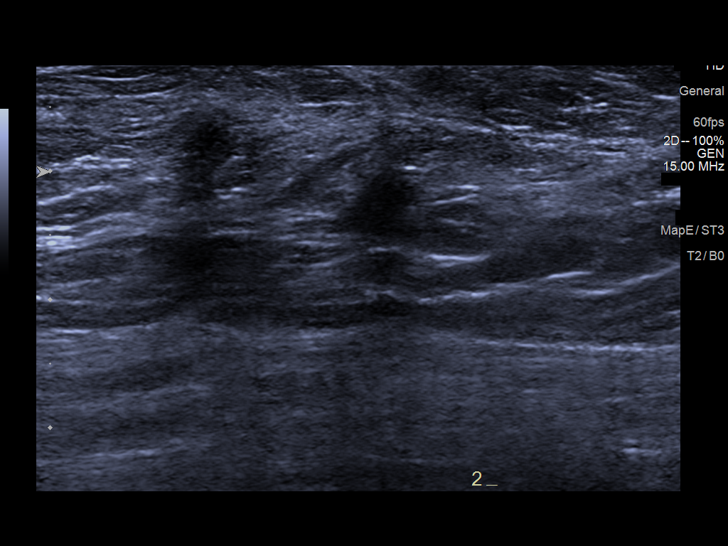
[im 9/13]
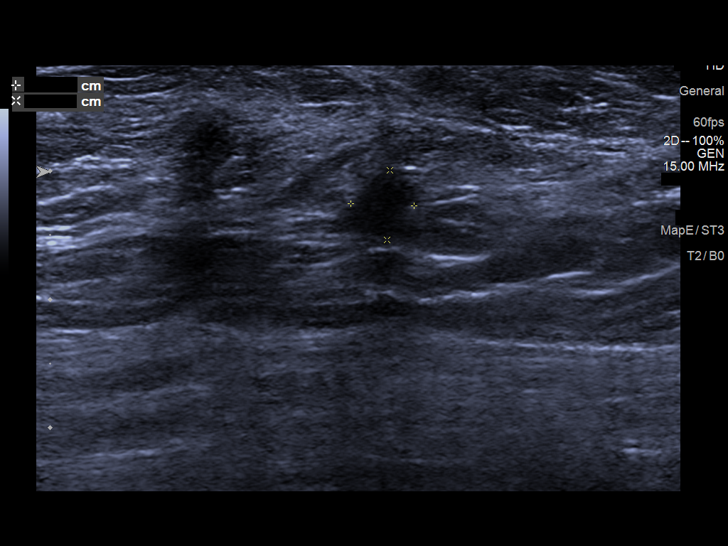
[im 10/13]
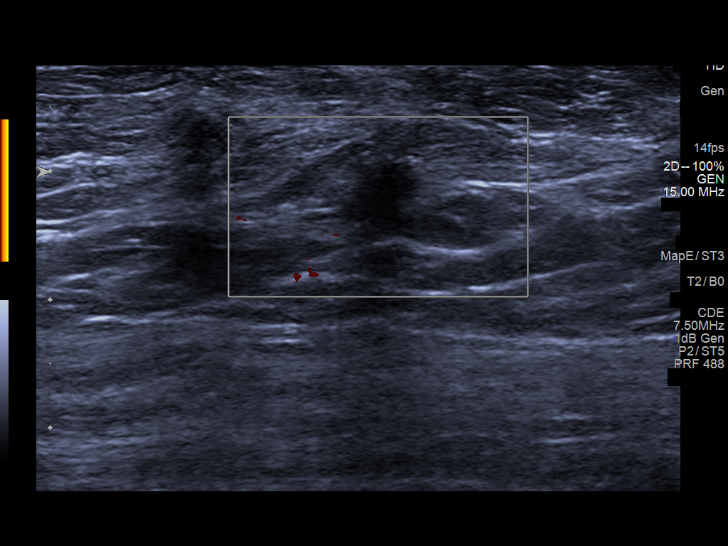
[im 11/13]
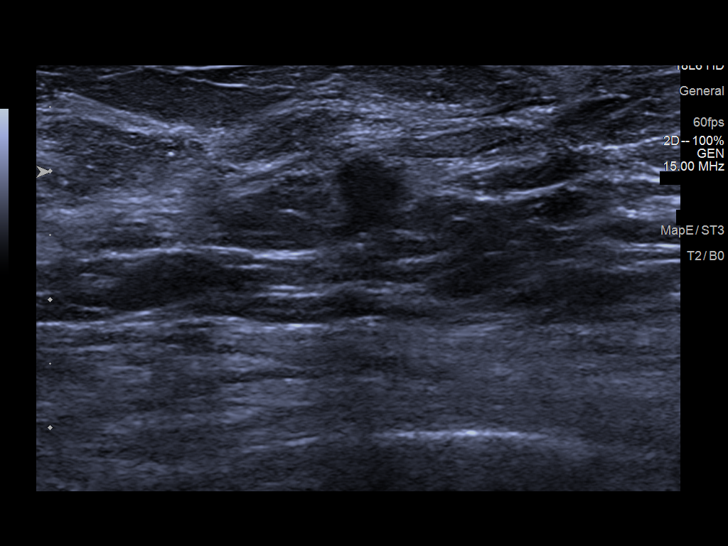
[im 12/13]
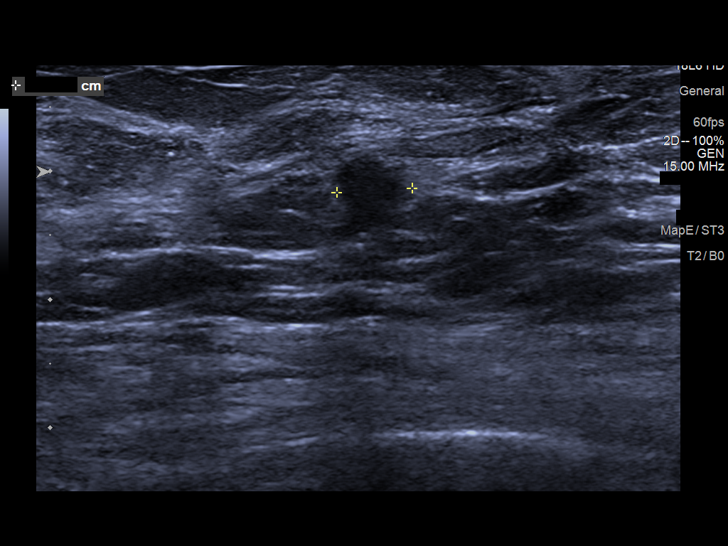
[im 13/13]
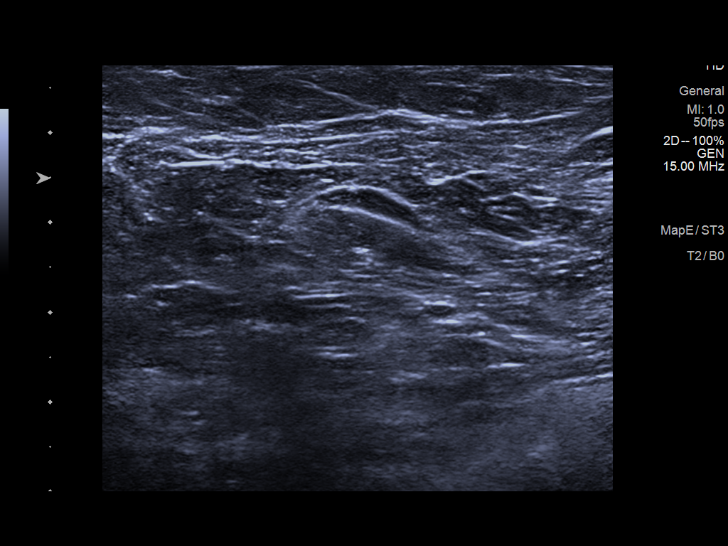

[13 of 13 positions shown; findings below may reference images not displayed]

ACR Breast Density Category b: There are scattered areas of
fibroglandular density.
FINDINGS: Lateral view of right breast, spot compression cc and MLO views of
the right breast are submitted. There is a persistent spiculated
mass in the retroareolar central anterior to mid depth right breast.

Targeted ultrasound is performed, showing there are 2 irregular
hypoechoic masses at the right breast 12 o'clock 1 cm from nipple,
correlating to the mammographic finding. Each of the masses measured
6 mm in maximum dimension. The 2 masses are 1.2 cm apart. Ultrasound
of the right axilla is negative.
IMPRESSION: Highly suspicious findings.

RECOMMENDATION:
Ultrasound-guided core biopsies of the 2 masses at right breast 12
o'clock.

I have discussed the findings and recommendations with the patient.
If applicable, a reminder letter will be sent to the patient
regarding the next appointment.

BI-RADS CATEGORY  5: Highly suggestive of malignancy.

## 2023-02-11 IMAGING — MG MM DIGITAL DIAGNOSTIC UNILAT*R* W/ TOMO W/ CAD
6 series · 6 of 18 positions shown · non-contrast
Comparison: Prior films

CLINICAL DATA: Callback from screening mammogram for possible mass
right breast

EXAM:
DIGITAL DIAGNOSTIC UNILATERAL RIGHT MAMMOGRAM WITH TOMOSYNTHESIS AND
CAD; ULTRASOUND RIGHT BREAST LIMITED
TECHNIQUE: Right digital diagnostic mammography and breast tomosynthesis was
performed. The images were evaluated with computer-aided detection.;
Targeted ultrasound examination of the right breast was performed

[R MLO synth-2D]
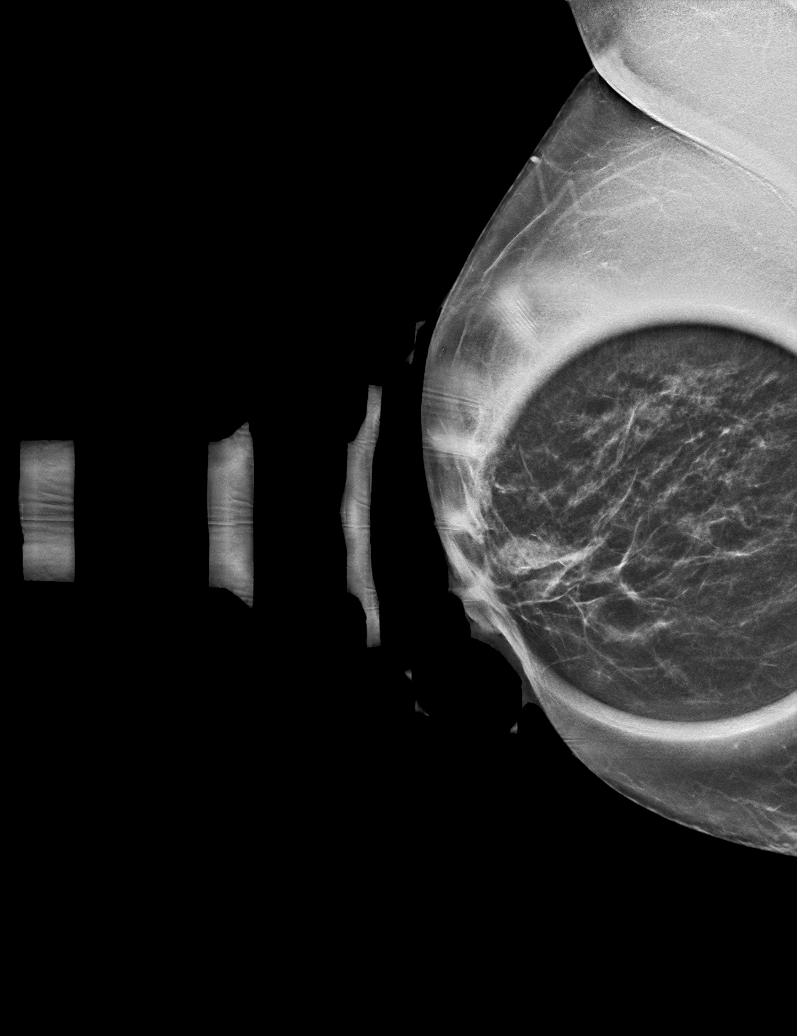

[R CC synth-2D]
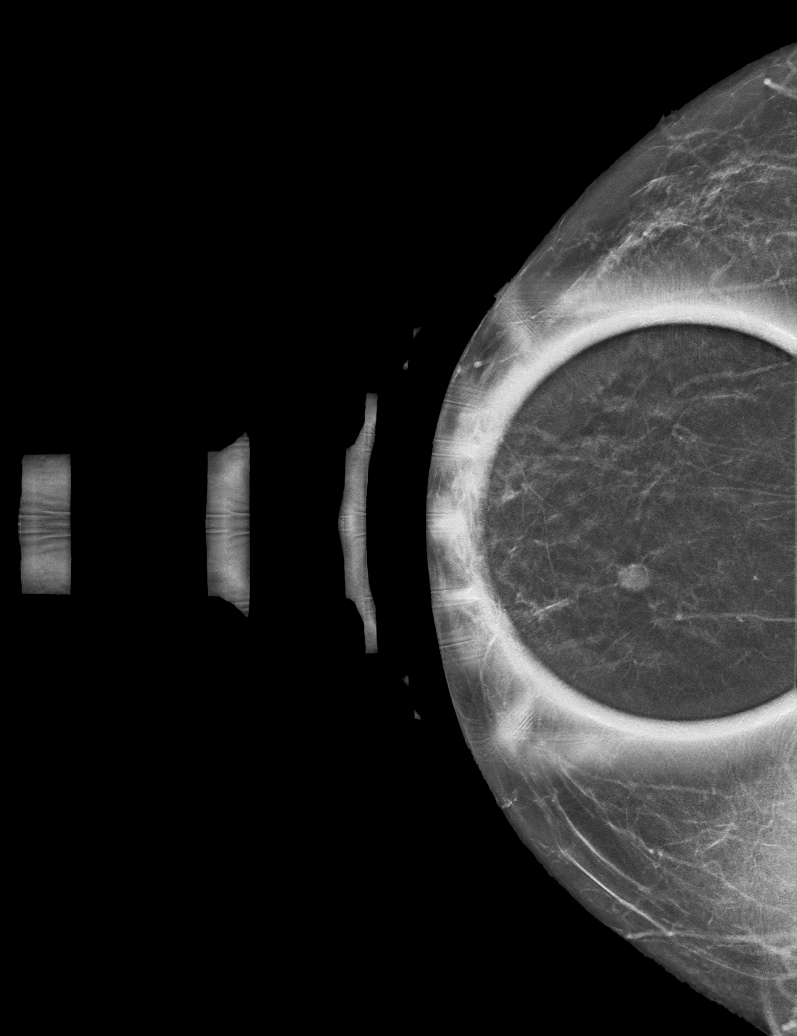

[R ML synth-2D]
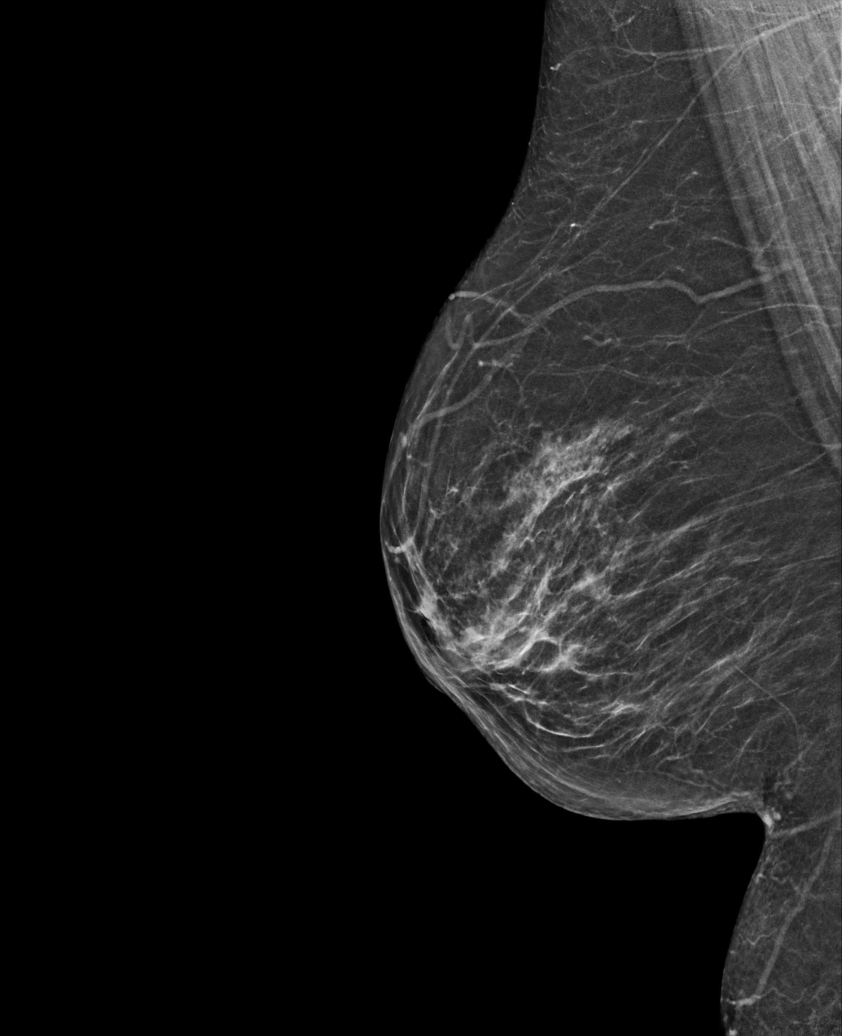

[R MLO tomo · tomo slice 23/46.0]
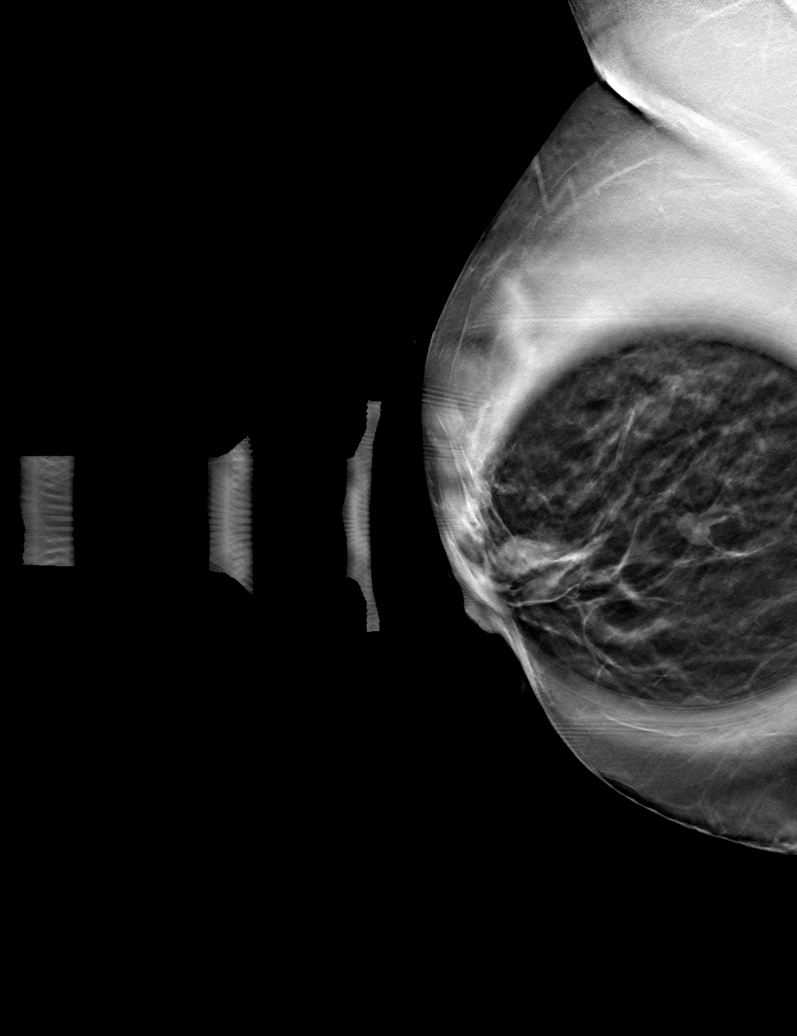

[R ML tomo · tomo slice 29/57.0]
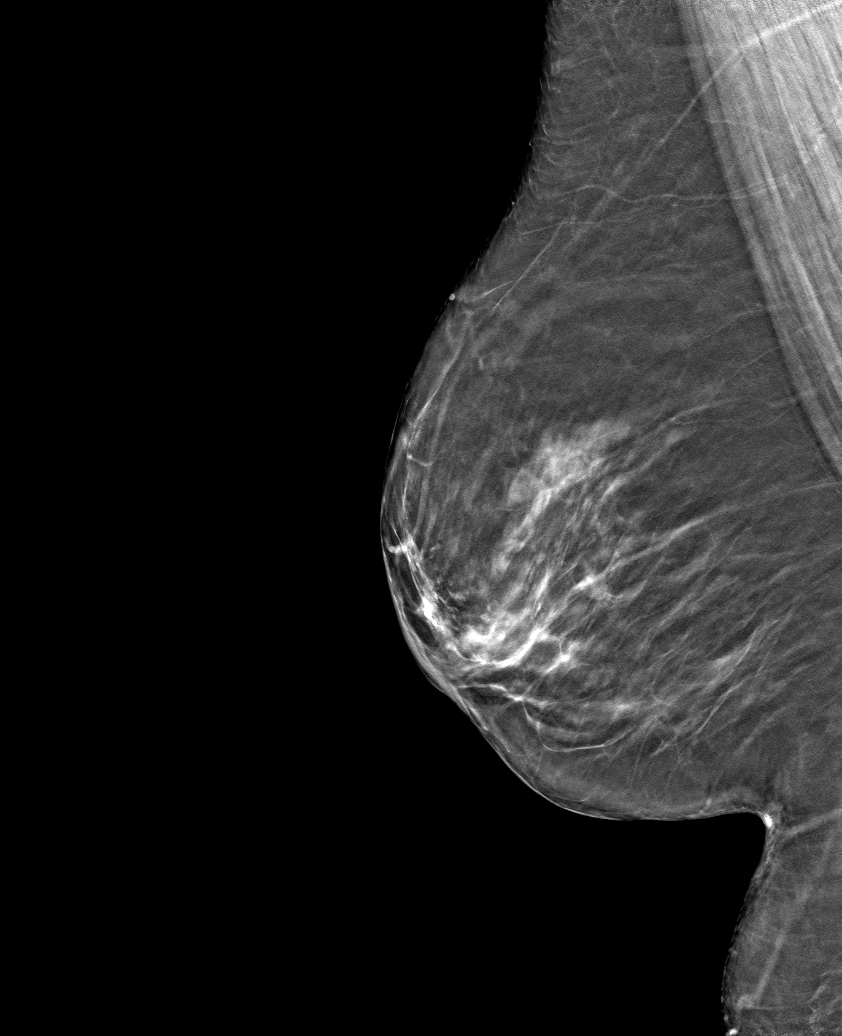

[R CC tomo · tomo slice 23/45.0]
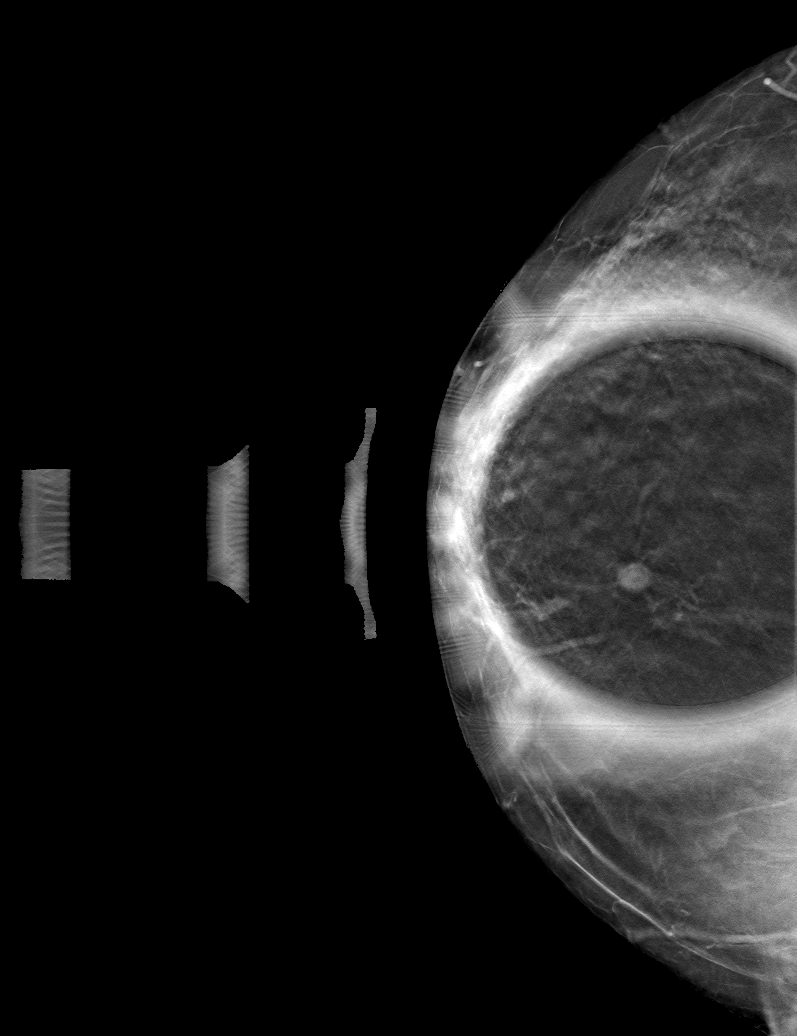

[6 of 18 positions shown; findings below may reference images not displayed]

ACR Breast Density Category b: There are scattered areas of
fibroglandular density.
FINDINGS: Lateral view of right breast, spot compression cc and MLO views of
the right breast are submitted. There is a persistent spiculated
mass in the retroareolar central anterior to mid depth right breast.

Targeted ultrasound is performed, showing there are 2 irregular
hypoechoic masses at the right breast 12 o'clock 1 cm from nipple,
correlating to the mammographic finding. Each of the masses measured
6 mm in maximum dimension. The 2 masses are 1.2 cm apart. Ultrasound
of the right axilla is negative.
IMPRESSION: Highly suspicious findings.

RECOMMENDATION:
Ultrasound-guided core biopsies of the 2 masses at right breast 12
o'clock.

I have discussed the findings and recommendations with the patient.
If applicable, a reminder letter will be sent to the patient
regarding the next appointment.

BI-RADS CATEGORY  5: Highly suggestive of malignancy.

## 2023-02-25 ENCOUNTER — Encounter: Payer: Self-pay | Admitting: Oncology

## 2023-02-25 ENCOUNTER — Inpatient Hospital Stay: Payer: BC Managed Care – PPO | Attending: Oncology | Admitting: Oncology

## 2023-02-25 VITALS — BP 165/86 | HR 67 | Temp 97.1°F | Ht 65.5 in | Wt 190.7 lb

## 2023-02-25 DIAGNOSIS — C50911 Malignant neoplasm of unspecified site of right female breast: Secondary | ICD-10-CM | POA: Diagnosis not present

## 2023-02-25 DIAGNOSIS — Z170 Estrogen receptor positive status [ER+]: Secondary | ICD-10-CM | POA: Insufficient documentation

## 2023-02-25 DIAGNOSIS — Z801 Family history of malignant neoplasm of trachea, bronchus and lung: Secondary | ICD-10-CM | POA: Insufficient documentation

## 2023-02-25 DIAGNOSIS — Z08 Encounter for follow-up examination after completed treatment for malignant neoplasm: Secondary | ICD-10-CM | POA: Diagnosis not present

## 2023-02-25 DIAGNOSIS — Z808 Family history of malignant neoplasm of other organs or systems: Secondary | ICD-10-CM | POA: Diagnosis not present

## 2023-02-25 DIAGNOSIS — Z803 Family history of malignant neoplasm of breast: Secondary | ICD-10-CM | POA: Diagnosis not present

## 2023-02-25 DIAGNOSIS — Z79899 Other long term (current) drug therapy: Secondary | ICD-10-CM | POA: Diagnosis not present

## 2023-02-25 DIAGNOSIS — Z923 Personal history of irradiation: Secondary | ICD-10-CM | POA: Diagnosis not present

## 2023-02-25 DIAGNOSIS — Z79811 Long term (current) use of aromatase inhibitors: Secondary | ICD-10-CM | POA: Diagnosis not present

## 2023-02-25 DIAGNOSIS — Z853 Personal history of malignant neoplasm of breast: Secondary | ICD-10-CM | POA: Diagnosis not present

## 2023-02-25 DIAGNOSIS — Z87891 Personal history of nicotine dependence: Secondary | ICD-10-CM | POA: Insufficient documentation

## 2023-03-01 NOTE — Progress Notes (Signed)
Hematology/Oncology Consult note [facility]  [phone] [phone]  Patient Care Team: [name], NP as PCP - General (Nurse Practitioner) Debbe Odea, MD as PCP - Cardiology (Cardiology) Mickle Mallory (Gastroenterology) Merri Ray, MD as Referring Physician (Physical Medicine and Rehabilitation) Maxie Barb, Ian Bushman, MD (Neurology) Scarlett Presto, RN (Inactive) as Oncology Nurse Navigator   Name of the patient: Morgan Sanders  [id number]  February 09, 1976   Date of visit: 03/01/23  Diagnosis- pathological prognostic stage Ia invasive mammary carcinoma of the right breast and pT1b pN1 acM0 ER/PR positive HER2 negative   Chief complaint/ Reason for visit- routine f/u of breast cancer on letrozole  Heme/Onc history: Patient is a 47 year old post menopausal female with a prior history of normocytic anemia for which she had a complete work-up including bone marrow biopsy as well as history of cirrhosis of the liver.  She is postmenopausal and she has not had any menstrual cycles since the age of 23.  Patient underwent a bilateral screening mammogram on 12/21/2020 which showed possible mass in the right breast.  This was followed by diagnostic mammogram and ultrasound which showed 2 masses in the right breast at 12 o'clock position each measuring 6 mm and 1.2 cm apart.  Ultrasound of the right axilla was negative for malignancy.  Patient underwent biopsy of both the medial and the lateral breast mass and it was consistent with invasive mammary carcinoma grade 2.  ER greater than 90% positive, PR greater than 90% positive and HER2 negative   Patient underwent right lumpectomy with sentinel lymph node biopsy Final pathology showed 2 tumors 7 mm in size both of which were grade 1.  The tissue in between did not have any evidence of malignancy or DCIS.  She was noted to have metastatic carcinoma in 2 out of 4 sentinel lymph node 6 mm  deposit with no extracapsular extension.   MammaPrint score came back at low risk and therefore patient did not require any adjuvant chemotherapy.  Patient completed radiation treatment and started Letrozole in August 2022  Interval history- she is tolerating letrozole well without significant side effects. Denies any breast concerns.   ECOG PS- 1 Pain scale- 0   Review of systems- Review of Systems  Constitutional:  Negative for chills, fever, malaise/fatigue and weight loss.  HENT:  Negative for congestion, ear discharge and nosebleeds.   Eyes:  Negative for blurred vision.  Respiratory:  Negative for cough, hemoptysis, sputum production, shortness of breath and wheezing.   Cardiovascular:  Negative for chest pain, palpitations, orthopnea and claudication.  Gastrointestinal:  Negative for abdominal pain, blood in stool, constipation, diarrhea, heartburn, melena, nausea and vomiting.  Genitourinary:  Negative for dysuria, flank pain, frequency, hematuria and urgency.  Musculoskeletal:  Negative for back pain, joint pain and myalgias.  Skin:  Negative for rash.  Neurological:  Negative for dizziness, tingling, focal weakness, seizures, weakness and headaches.  Endo/Heme/Allergies:  Does not bruise/bleed easily.  Psychiatric/Behavioral:  Negative for depression and suicidal ideas. The patient does not have insomnia.       No Known Allergies   Past Medical History:  Diagnosis Date   Allergy    Anemia    Anxiety    Asthma    Blood transfusion without reported diagnosis    Breast cancer (HCC)    Cataract    Chicken pox    Colon polyps    Depression    Heart murmur    Hypertension  Neuromuscular disorder (HCC)    neuropathy   Personal history of radiation therapy    Pneumonia    Stomach ulcer      Past Surgical History:  Procedure Laterality Date   BREAST BIOPSY Right 01/01/2021   u/s bx 12:00 1 cmfn positive x clip lateral   BREAST BIOPSY Right 01/01/2021   u/s  bx 12:00 1cmfn positive vision medial   BREAST LUMPECTOMY     BREAST LUMPECTOMY WITH SENTINEL LYMPH NODE BIOPSY Right 01/18/2021   Procedure: BREAST LUMPECTOMY WITH SENTINEL LYMPH NODE BX;  Surgeon: Earline Mayotte, MD;  Location: [facility] ORS;  Service: General;  Laterality: Right;   CHOLECYSTECTOMY     CHOLECYSTECTOMY, LAPAROSCOPIC  03/1996   COLONOSCOPY     ESOPHAGOGASTRODUODENOSCOPY (EGD) WITH PROPOFOL N/A 12/31/2018   Procedure: ESOPHAGOGASTRODUODENOSCOPY (EGD) WITH PROPOFOL;  Surgeon: Midge Minium, MD;  Location: [facility] END[facility];  Service: Endoscopy;  Laterality: N/A;   TUBAL LIGATION     WISDOM TOOTH EXTRACTION      Social History   Socioeconomic History   Marital status: Married    Spouse name: Harvie Heck   Number of children: 2   Years of education: high school   Highest education level: 12th grade  Occupational History   Occupation: Golf/clubhouse attendent  Tobacco Use   Smoking status: Former    Current packs/day: 0.00    Types: Cigarettes    Quit date: 08/18/1998    Years since quitting: 24.5   Smokeless tobacco: Never   Tobacco comments:    Former cigarette smoker  Advertising account planner   Vaping status: Some Days   Substances: Nicotine, Flavoring  Substance and Sexual Activity   Alcohol use: Not Currently    Comment: a few times a year   Drug use: Never   Sexual activity: Yes    Birth control/protection: Post-menopausal, Other-see comments  Other Topics Concern   Not on file  Social History Narrative   02/07/20   From: the area   Living: with husband, Harvie Heck (2000)   Work: at a golf course      Family: daughters - CJ (lives at [place], 2 children) and Geologist, engineering (still living at home, in Social worker school)      Enjoys: golf       Exercise: golfing 3-4 times a week - alternating between cart/walking   Diet: pretty good - eggs/fruit, eats lunch and dinner      Safety   Seat belts: Yes    Guns: Yes  and secure   Safe in relationships: Yes    Social Determinants of Health    Financial Resource Strain: Low Risk  (01/21/2019)   Received from [facility]   Overall Financial Resource Strain (CARDIA)    Difficulty of Paying Living Expenses: Not hard at all  Food Insecurity: Unknown (01/21/2019)   Received from [facility]   Hunger Vital Sign    Worried About Running Out of Food in the Last Year: Patient declined    Ran Out of Food in the Last Year: Patient declined  Transportation Needs: Unknown (01/21/2019)   Received from [facility] - Transportation    Lack of Transportation (Medical): Patient declined    Lack of Transportation (Non-Medical): Patient declined  Physical Activity: Sufficiently Active (01/21/2019)   Received from [facility]   Exercise Vital Sign    Days of Exercise per Week: 5 days    Minutes of Exercise per Session: 30 min  Recent Concern: Physical  Activity - Inactive (01/07/2019)   Exercise Vital Sign    Days of Exercise per Week: 0 days    Minutes of Exercise per Session: 0 min  Stress: No Stress Concern Present (01/21/2019)   Received from [facility] - Occupational Stress Questionnaire    Feeling of Stress : Not at all  Social Connections: Moderately Integrated (01/21/2019)   Received from [facility]   Social Connection and Isolation Panel [NHANES]    Frequency of Communication with Friends and Family: More than three times a week    Frequency of Social Gatherings with Friends and Family: Twice a week    Attends Religious Services: 1 to 4 times per year    Active Member of [employer] or Organizations: No    Attends Banker Meetings: Never    Marital Status: Married  Catering manager Violence: Unknown (01/21/2019)   Received from [facility]   Humiliation, Afraid, Rape, and Kick questionnaire    Fear of Current or Ex-Partner: Patient declined    Emotionally Abused: Patient declined    Physically Abused: Patient declined    Sexually Abused: Patient  declined    Family History  Problem Relation Age of Onset   Hypertension Mother    Diabetes Mother    Hyperlipidemia Mother    Kidney disease Mother    Arthritis Mother    Asthma Mother    Depression Mother    Sudden death Father        hit by train   Liver disease Father        hx of liver transplant   Early death Father    Hyperlipidemia Sister    Hypertension Sister    Breast cancer Maternal Grandmother 65   Arthritis Maternal Grandmother    Cancer Maternal Grandmother    Diabetes Maternal Grandmother    Hearing loss Maternal Grandmother    Hypertension Maternal Grandmother    Early death Maternal Grandfather    Heart attack Paternal Grandmother        passed from heart attach   Cataracts Paternal Grandfather    Lung cancer Paternal Grandfather    Depression Daughter    Depression Daughter    Bone cancer Maternal Great-grandmother      Current Outpatient Medications:    albuterol (VENTOLIN HFA) 108 (90 Base) MCG/ACT inhaler, INHALE 2 PUFFS INTO THE LUNGS EVERY 4 (FOUR) HOURS AS NEEDED FOR SHORTNESS OF BREATH OR WHEEZING, Disp: 18 each, Rfl: 1   ALPRAZolam (XANAX) 0.5 MG tablet, TAKE 1 TABLET (0.5 MG TOTAL) BY MOUTH DAILY AS NEEDED FOR ANXIETY., Disp: 30 tablet, Rfl: 5   budesonide-formoterol (SYMBICORT) 80-4.5 MCG/ACT inhaler, Inhale 2 puffs into the lungs 2 (two) times daily. Rinse mouth out after each use, Disp: 1 each, Rfl: 12   Cholecalciferol (VITAMIN D3) 125 MCG (5000 UT) CAPS, Take 5,000 Units by mouth daily., Disp: , Rfl:    escitalopram (LEXAPRO) 20 MG tablet, Take 1 tablet (20 mg total) by mouth daily., Disp: 90 tablet, Rfl: 3   famotidine (PEPCID) 20 MG tablet, Take 20 mg by mouth 2 (two) times daily., Disp: , Rfl:    fenofibrate (TRICOR) 145 MG tablet, Take 1 tablet (145 mg total) by mouth daily., Disp: 90 tablet, Rfl: 3   letrozole (FEMARA) 2.5 MG tablet, TAKE 1 TABLET BY MOUTH EVERY DAY, Disp: 90 tablet, Rfl: 2   montelukast (SINGULAIR) 10 MG tablet, TAKE  1 TABLET BY MOUTH EVERYDAY  AT BEDTIME, Disp: 90 tablet, Rfl: 3   Potassium Chloride ER 20 MEQ TBCR, Take 1 tablet (20 mEq total) by mouth daily., Disp: 90 tablet, Rfl: 1   pregabalin (LYRICA) 200 MG capsule, Take 200 mg by mouth 2 (two) times daily., Disp: , Rfl:   Physical exam:  Vitals:   02/25/23 1127  BP: (!) 165/86  Pulse: 67  Temp: (!) 97.1 F (36.2 C)  TempSrc: Tympanic  SpO2: 100%  Weight: 190 lb 11.2 oz (86.5 kg)  Height: 5' 5.5" (1.664 m)   Physical Exam Cardiovascular:     Rate and Rhythm: Normal rate and regular rhythm.     Heart sounds: Normal heart sounds.  Pulmonary:     Effort: Pulmonary effort is normal.  Skin:    General: Skin is warm and dry.  Neurological:     Mental Status: She is alert and oriented to person, place, and time.    Breast exam was performed in seated and lying down position. Patient is status post right lumpectomy with a well-healed surgical scar. No evidence of any palpable masses. No evidence of axillary adenopathy. No evidence of any palpable masses or lumps in the left breast. No evidence of leftt axillary adenopathy      Latest Ref Rng & Units 12/30/2022    9:14 AM  CMP  Glucose 70 - 99 mg/dL 161   BUN 6 - 23 mg/dL 12   Creatinine 0.96 - 1.20 mg/dL 0.45   Sodium 409 - 811 mEq/L 140   Potassium 3.5 - 5.1 mEq/L 4.8   Chloride 96 - 112 mEq/L 105   CO2 19 - 32 mEq/L 26   Calcium 8.4 - 10.5 mg/dL 9.2   Total Protein 6.0 - 8.3 g/dL 7.1   Total Bilirubin 0.2 - 1.2 mg/dL 0.4   Alkaline Phos 39 - 117 U/L 70   AST 0 - 37 U/L 18   ALT 0 - 35 U/L 17       Latest Ref Rng & Units 12/30/2022    9:14 AM  CBC  WBC 4.0 - 10.5 K/uL 3.8   Hemoglobin 12.0 - 15.0 g/dL 91.4   Hematocrit 78.2 - 46.0 % 39.8   Platelets 150.0 - 400.0 K/uL 124.0      Assessment and plan- Patient is a 47 y.o. female with pathological prognostic stage Ia invasive mammary carcinoma of the right breast MPT [id number] ER/PR positive HER2 negative.  She is status  postlumpectomy and adjuvant radiation treatment.  This is a routine f/u of breast cancer on letrozole.   Patient is tolerating letrozole along with calcium and vitamin D well without any significant side effects.  Her baseline bone density scan is normal.  Denies any breast concerns.  Clinically she is doing well with no concerning signs and symptoms of recurrence based on today's exam.  Her mammogram from 2024 May was unremarkable.  I will see her back in 6 months and we will repeat her bone density scan in September 2024.  He he is   Visit Diagnosis 1. Encounter for follow-up surveillance of breast cancer   2. Use of letrozole (Femara)   3. High risk medication use      Dr. Owens Shark, MD, MPH [facility] [phone] 03/01/2023 4:50 PM

## 2023-04-10 DIAGNOSIS — E519 Thiamine deficiency, unspecified: Secondary | ICD-10-CM | POA: Diagnosis not present

## 2023-04-10 DIAGNOSIS — G608 Other hereditary and idiopathic neuropathies: Secondary | ICD-10-CM | POA: Diagnosis not present

## 2023-04-10 DIAGNOSIS — R42 Dizziness and giddiness: Secondary | ICD-10-CM | POA: Diagnosis not present

## 2023-04-10 DIAGNOSIS — R55 Syncope and collapse: Secondary | ICD-10-CM | POA: Diagnosis not present

## 2023-05-05 ENCOUNTER — Ambulatory Visit
Admission: RE | Admit: 2023-05-05 | Discharge: 2023-05-05 | Disposition: A | Discharge status: Home / Self Care | Payer: BC Managed Care – PPO | Source: Ambulatory Visit | Attending: Oncology | Admitting: Oncology

## 2023-05-05 DIAGNOSIS — Z08 Encounter for follow-up examination after completed treatment for malignant neoplasm: Secondary | ICD-10-CM | POA: Insufficient documentation

## 2023-05-05 DIAGNOSIS — M85851 Other specified disorders of bone density and structure, right thigh: Secondary | ICD-10-CM | POA: Diagnosis not present

## 2023-05-05 DIAGNOSIS — Z853 Personal history of malignant neoplasm of breast: Secondary | ICD-10-CM | POA: Diagnosis not present

## 2023-05-18 ENCOUNTER — Other Ambulatory Visit: Payer: Self-pay | Admitting: Family Medicine

## 2023-05-18 DIAGNOSIS — G629 Polyneuropathy, unspecified: Secondary | ICD-10-CM

## 2023-06-02 ENCOUNTER — Telehealth: Payer: Self-pay | Admitting: Nurse Practitioner

## 2023-06-02 DIAGNOSIS — G629 Polyneuropathy, unspecified: Secondary | ICD-10-CM

## 2023-06-02 MED ORDER — POTASSIUM CHLORIDE ER 20 MEQ PO TBCR
1.0000 | EXTENDED_RELEASE_TABLET | Freq: Every day | ORAL | 1 refills | Status: DC
Start: 2023-06-02 — End: 2023-07-31

## 2023-06-02 NOTE — Telephone Encounter (Signed)
Refill has been sent.

## 2023-06-02 NOTE — Addendum Note (Signed)
Addended by: Donavan Foil on: 06/02/2023 10:21 AM   Modules accepted: Orders

## 2023-06-02 NOTE — Telephone Encounter (Signed)
Patient called and states a med refill on  Potassium Chloride ER 20 MEQ TBCR Please send to [facility] on [address]

## 2023-06-05 ENCOUNTER — Other Ambulatory Visit: Payer: Self-pay

## 2023-06-05 DIAGNOSIS — T7840XD Allergy, unspecified, subsequent encounter: Secondary | ICD-10-CM

## 2023-06-05 MED ORDER — ALBUTEROL SULFATE HFA 108 (90 BASE) MCG/ACT IN AERS
2.0000 | INHALATION_SPRAY | RESPIRATORY_TRACT | 1 refills | Status: DC | PRN
Start: 2023-06-05 — End: 2023-10-07

## 2023-07-07 ENCOUNTER — Other Ambulatory Visit: Payer: Self-pay | Admitting: Nurse Practitioner

## 2023-07-07 DIAGNOSIS — F411 Generalized anxiety disorder: Secondary | ICD-10-CM

## 2023-07-14 ENCOUNTER — Ambulatory Visit: Payer: BC Managed Care – PPO | Admitting: Nurse Practitioner

## 2023-07-14 ENCOUNTER — Encounter: Payer: Self-pay | Admitting: Nurse Practitioner

## 2023-07-14 VITALS — BP 142/82 | HR 72 | Temp 98.7°F | Ht 65.5 in | Wt 194.0 lb

## 2023-07-14 DIAGNOSIS — F331 Major depressive disorder, recurrent, moderate: Secondary | ICD-10-CM

## 2023-07-14 DIAGNOSIS — E782 Mixed hyperlipidemia: Secondary | ICD-10-CM | POA: Diagnosis not present

## 2023-07-14 DIAGNOSIS — Z170 Estrogen receptor positive status [ER+]: Secondary | ICD-10-CM

## 2023-07-14 DIAGNOSIS — F411 Generalized anxiety disorder: Secondary | ICD-10-CM

## 2023-07-14 DIAGNOSIS — R030 Elevated blood-pressure reading, without diagnosis of hypertension: Secondary | ICD-10-CM | POA: Diagnosis not present

## 2023-07-14 DIAGNOSIS — C50911 Malignant neoplasm of unspecified site of right female breast: Secondary | ICD-10-CM | POA: Diagnosis not present

## 2023-07-14 DIAGNOSIS — Z23 Encounter for immunization: Secondary | ICD-10-CM

## 2023-07-14 DIAGNOSIS — G629 Polyneuropathy, unspecified: Secondary | ICD-10-CM

## 2023-07-14 NOTE — Progress Notes (Signed)
Morgan Dicker, NP-C Phone: [phone]  Morgan Sanders is a 47 y.o. female who presents today for follow up.   Discussed the use of AI scribe software for clinical note transcription with the patient, who gave verbal consent to proceed.  History of Present Illness   The patient, with a history of hyperlipidemia, hypokalemia, and neuropathy, presents for a six-month follow-up. They report feeling well overall, with no new health concerns. They are compliant with their current medication regimen, which includes Lexapro, Xanax, Letrozole, Fenofibrate, and Potassium supplements.  The patient's blood pressure was noted to be elevated during the visit, which they attributed to recent stressors, including an expensive car repair. They have no history of hypertension, and this is the first elevated reading in this clinic, although a previous high reading was noted during a July visit to oncology.  The patient's hyperlipidemia, specifically high triglycerides, was previously discussed and attributed to high juice consumption. They were started on Fenofibrate, which they tolerate well, with no reported muscle aches or abdominal pain.  The patient also reports compliance with their Letrozole, prescribed by their oncologist, and they have no new concerns related to their cancer treatment. They continue to see a neurologist for neuropathy management.  The patient denies any chest pain, shortness of breath, or dizziness. They did mention a recent incident of back pain after a golfing mishap, but it does not seem to be a persistent issue.  Overall, the patient appears to be managing their chronic conditions well, with no new or worsening symptoms.      Social History   Tobacco Use  Smoking Status Former   Current packs/day: 0.00   Types: Cigarettes   Quit date: 08/18/1998   Years since quitting: 24.9  Smokeless Tobacco Never  Tobacco Comments   Former cigarette smoker    Current Outpatient  Medications on File Prior to Visit  Medication Sig Dispense Refill   albuterol (VENTOLIN HFA) 108 (90 Base) MCG/ACT inhaler Inhale 2 puffs into the lungs every 4 (four) hours as needed for shortness of breath or wheezing. 18 each 1   ALPRAZolam (XANAX) 0.5 MG tablet TAKE 1 TABLET BY MOUTH EVERY DAY AS NEEDED FOR ANXIETY 30 tablet 5   budesonide-formoterol (SYMBICORT) 80-4.5 MCG/ACT inhaler Inhale 2 puffs into the lungs 2 (two) times daily. Rinse mouth out after each use 1 each 12   Cholecalciferol (VITAMIN D3) 125 MCG (5000 UT) CAPS Take 5,000 Units by mouth daily.     escitalopram (LEXAPRO) 20 MG tablet Take 1 tablet (20 mg total) by mouth daily. 90 tablet 3   famotidine (PEPCID) 20 MG tablet Take 20 mg by mouth 2 (two) times daily.     fenofibrate (TRICOR) 145 MG tablet Take 1 tablet (145 mg total) by mouth daily. 90 tablet 3   letrozole (FEMARA) 2.5 MG tablet TAKE 1 TABLET BY MOUTH EVERY DAY 90 tablet 2   montelukast (SINGULAIR) 10 MG tablet TAKE 1 TABLET BY MOUTH EVERYDAY AT BEDTIME 90 tablet 3   Potassium Chloride ER 20 MEQ TBCR Take 1 tablet (20 mEq total) by mouth daily. 90 tablet 1   pregabalin (LYRICA) 200 MG capsule Take 200 mg by mouth 2 (two) times daily.     No current facility-administered medications on file prior to visit.    ROS see history of present illness  Objective  Physical Exam Vitals:   07/14/23 1454 07/14/23 1506  BP: (!) 142/84 (!) 142/82  Pulse: 72   Temp: 98.7 F (37.1 C)  SpO2: 97%     BP Readings from Last 3 Encounters:  07/14/23 (!) 142/82  02/25/23 (!) 165/86  01/09/23 110/76   Wt Readings from Last 3 Encounters:  07/14/23 194 lb (88 kg)  02/25/23 190 lb 11.2 oz (86.5 kg)  01/09/23 186 lb 9.6 oz (84.6 kg)    Physical Exam Constitutional:      General: She is not in acute distress.    Appearance: Normal appearance.  HENT:     Head: Normocephalic.  Cardiovascular:     Rate and Rhythm: Normal rate and regular rhythm.     Heart sounds:  Normal heart sounds.  Pulmonary:     Effort: Pulmonary effort is normal.     Breath sounds: Normal breath sounds.  Skin:    General: Skin is warm and dry.  Neurological:     General: No focal deficit present.     Mental Status: She is alert.  Psychiatric:        Mood and Affect: Mood normal.        Behavior: Behavior normal.     Assessment/Plan: Please see individual problem list.  Elevated blood pressure reading in office without diagnosis of hypertension Assessment & Plan: They presented with elevated blood pressure today, which may be related to recent stressors. They have no history of hypertension treatment and reported no symptoms such as chest pain, shortness of breath, or dizziness. We will recheck their blood pressure today and monitor their blood pressure readings in future visits.   Mixed hyperlipidemia Assessment & Plan: They are currently on fenofibrate for previously elevated triglycerides and have reported no side effects. We will check a lipid panel today to assess their response to fenofibrate.  Orders: -     Lipid panel  Malignant neoplasm of right breast, stage 1, estrogen receptor positive (HCC) Assessment & Plan: They are on letrozole and were last seen by oncology in July. Mammogram stable. We will continue their current regimen and follow up with oncology as scheduled.   Polyneuropathy Assessment & Plan: They are followed by neurology. We will continue their current regimen and follow up with neurology as scheduled.  Orders: -     Comprehensive metabolic panel -     CBC with Differential/Platelet  Moderate episode of recurrent major depressive disorder (HCC) Assessment & Plan: Chronic. Stable on Lexapro 20 mg daily. Continue. Encouraged to contact if worsening symptoms, unusual behavior changes or suicidal thoughts occur.    Generalized anxiety disorder Assessment & Plan: Chronic. Stable on Xanax 0.5 mg daily PRN and Lexapro 20 mg daily.  Continue.   Need for influenza vaccination -     Flu vaccine trivalent PF, 6mos and older(Flulaval,Afluria,Fluarix,Fluzone)    Return in about 6 months (around 01/11/2024) for Follow up.   Morgan Dicker, NP-C [facility] - [employer]

## 2023-07-15 LAB — CBC WITH DIFFERENTIAL/PLATELET
Basophils Absolute: 0 10*3/uL (ref 0.0–0.1)
Basophils Relative: 0.5 % (ref 0.0–3.0)
Eosinophils Absolute: 0.1 10*3/uL (ref 0.0–0.7)
Eosinophils Relative: 1.8 % (ref 0.0–5.0)
HCT: 40.7 % (ref 36.0–46.0)
Hemoglobin: 13.6 g/dL (ref 12.0–15.0)
Lymphocytes Relative: 30.5 % (ref 12.0–46.0)
Lymphs Abs: 1.8 10*3/uL (ref 0.7–4.0)
MCHC: 33.5 g/dL (ref 30.0–36.0)
MCV: 96 fL (ref 78.0–100.0)
Monocytes Absolute: 0.5 10*3/uL (ref 0.1–1.0)
Monocytes Relative: 8.9 % (ref 3.0–12.0)
Neutro Abs: 3.4 10*3/uL (ref 1.4–7.7)
Neutrophils Relative %: 58.3 % (ref 43.0–77.0)
Platelets: 196 10*3/uL (ref 150.0–400.0)
RBC: 4.24 Mil/uL (ref 3.87–5.11)
RDW: 13 % (ref 11.5–15.5)
WBC: 5.8 10*3/uL (ref 4.0–10.5)

## 2023-07-15 LAB — COMPREHENSIVE METABOLIC PANEL
ALT: 26 U/L (ref 0–35)
AST: 28 U/L (ref 0–37)
Albumin: 4.8 g/dL (ref 3.5–5.2)
Alkaline Phosphatase: 38 U/L — ABNORMAL LOW (ref 39–117)
BUN: 16 mg/dL (ref 6–23)
CO2: 26 meq/L (ref 19–32)
Calcium: 9.6 mg/dL (ref 8.4–10.5)
Chloride: 106 meq/L (ref 96–112)
Creatinine, Ser: 1.02 mg/dL (ref 0.40–1.20)
GFR: 64.85 mL/min (ref >60.00)
Glucose, Bld: 97 mg/dL (ref 70–99)
Potassium: 4.4 meq/L (ref 3.5–5.1)
Sodium: 140 meq/L (ref 135–145)
Total Bilirubin: 0.6 mg/dL (ref 0.2–1.2)
Total Protein: 7.5 g/dL (ref 6.0–8.3)

## 2023-07-15 LAB — LIPID PANEL
Cholesterol: 269 mg/dL — ABNORMAL HIGH (ref 0–200)
HDL: 49.3 mg/dL (ref >39.00)
LDL Cholesterol: 181 mg/dL — ABNORMAL HIGH (ref 0–99)
NonHDL: 219.31
Total CHOL/HDL Ratio: 5
Triglycerides: 191 mg/dL — ABNORMAL HIGH (ref 0.0–149.0)
VLDL: 38.2 mg/dL (ref 0.0–40.0)

## 2023-07-21 ENCOUNTER — Other Ambulatory Visit: Payer: Self-pay | Admitting: Nurse Practitioner

## 2023-07-21 ENCOUNTER — Telehealth: Payer: Self-pay

## 2023-07-21 DIAGNOSIS — E782 Mixed hyperlipidemia: Secondary | ICD-10-CM

## 2023-07-21 MED ORDER — ROSUVASTATIN CALCIUM 10 MG PO TABS: 10.0000 mg | ORAL_TABLET | Freq: Every day | ORAL | 3 refills | Status: DC

## 2023-07-21 NOTE — Assessment & Plan Note (Signed)
They are currently on fenofibrate for previously elevated triglycerides and have reported no side effects. We will check a lipid panel today to assess their response to fenofibrate.

## 2023-07-21 NOTE — Assessment & Plan Note (Signed)
Chronic. Stable on Lexapro 20 mg daily. Continue. Encouraged to contact if worsening symptoms, unusual behavior changes or suicidal thoughts occur.

## 2023-07-21 NOTE — Assessment & Plan Note (Addendum)
They presented with elevated blood pressure today, which may be related to recent stressors. They have no history of hypertension treatment and reported no symptoms such as chest pain, shortness of breath, or dizziness. We will recheck their blood pressure today and monitor their blood pressure readings in future visits.

## 2023-07-21 NOTE — Assessment & Plan Note (Signed)
They are followed by neurology. We will continue their current regimen and follow up with neurology as scheduled.

## 2023-07-21 NOTE — Telephone Encounter (Signed)
VM left informing pt that I was calling in regards to labs and that I will send a mychart msg to her.   Mychart msg was sent.

## 2023-07-21 NOTE — Assessment & Plan Note (Addendum)
They are on letrozole and were last seen by oncology in July. Mammogram stable. We will continue their current regimen and follow up with oncology as scheduled.

## 2023-07-21 NOTE — Assessment & Plan Note (Signed)
Chronic. Stable on Xanax 0.5 mg daily PRN and Lexapro 20 mg daily. Continue.

## 2023-07-31 ENCOUNTER — Other Ambulatory Visit: Payer: Self-pay | Admitting: Nurse Practitioner

## 2023-07-31 DIAGNOSIS — G629 Polyneuropathy, unspecified: Secondary | ICD-10-CM

## 2023-08-28 ENCOUNTER — Inpatient Hospital Stay: Payer: BC Managed Care – PPO | Admitting: Oncology

## 2023-09-14 DIAGNOSIS — K7030 Alcoholic cirrhosis of liver without ascites: Secondary | ICD-10-CM | POA: Diagnosis not present

## 2023-09-14 DIAGNOSIS — Z8711 Personal history of peptic ulcer disease: Secondary | ICD-10-CM | POA: Diagnosis not present

## 2023-09-14 DIAGNOSIS — Z0283 Encounter for blood-alcohol and blood-drug test: Secondary | ICD-10-CM | POA: Diagnosis not present

## 2023-09-15 ENCOUNTER — Other Ambulatory Visit: Payer: Self-pay | Admitting: Gastroenterology

## 2023-09-15 DIAGNOSIS — K7030 Alcoholic cirrhosis of liver without ascites: Secondary | ICD-10-CM

## 2023-09-16 ENCOUNTER — Encounter: Payer: Self-pay | Admitting: Gastroenterology

## 2023-09-18 ENCOUNTER — Inpatient Hospital Stay: Payer: BC Managed Care – PPO | Attending: Oncology | Admitting: Oncology

## 2023-09-18 ENCOUNTER — Encounter: Payer: Self-pay | Admitting: Oncology

## 2023-09-18 VITALS — BP 138/75 | HR 76 | Temp 98.7°F | Resp 18 | Wt 205.0 lb

## 2023-09-18 DIAGNOSIS — C773 Secondary and unspecified malignant neoplasm of axilla and upper limb lymph nodes: Secondary | ICD-10-CM | POA: Diagnosis not present

## 2023-09-18 DIAGNOSIS — Z87891 Personal history of nicotine dependence: Secondary | ICD-10-CM | POA: Diagnosis not present

## 2023-09-18 DIAGNOSIS — Z801 Family history of malignant neoplasm of trachea, bronchus and lung: Secondary | ICD-10-CM | POA: Insufficient documentation

## 2023-09-18 DIAGNOSIS — Z853 Personal history of malignant neoplasm of breast: Secondary | ICD-10-CM | POA: Diagnosis not present

## 2023-09-18 DIAGNOSIS — Z923 Personal history of irradiation: Secondary | ICD-10-CM | POA: Diagnosis not present

## 2023-09-18 DIAGNOSIS — Z08 Encounter for follow-up examination after completed treatment for malignant neoplasm: Secondary | ICD-10-CM | POA: Diagnosis not present

## 2023-09-18 DIAGNOSIS — Z79899 Other long term (current) drug therapy: Secondary | ICD-10-CM | POA: Diagnosis not present

## 2023-09-18 DIAGNOSIS — Z170 Estrogen receptor positive status [ER+]: Secondary | ICD-10-CM | POA: Insufficient documentation

## 2023-09-18 DIAGNOSIS — Z1721 Progesterone receptor positive status: Secondary | ICD-10-CM | POA: Insufficient documentation

## 2023-09-18 DIAGNOSIS — C50911 Malignant neoplasm of unspecified site of right female breast: Secondary | ICD-10-CM | POA: Diagnosis not present

## 2023-09-18 DIAGNOSIS — Z79811 Long term (current) use of aromatase inhibitors: Secondary | ICD-10-CM | POA: Diagnosis not present

## 2023-09-18 DIAGNOSIS — Z808 Family history of malignant neoplasm of other organs or systems: Secondary | ICD-10-CM | POA: Insufficient documentation

## 2023-09-18 DIAGNOSIS — Z803 Family history of malignant neoplasm of breast: Secondary | ICD-10-CM | POA: Diagnosis not present

## 2023-09-18 DIAGNOSIS — Z7951 Long term (current) use of inhaled steroids: Secondary | ICD-10-CM | POA: Diagnosis not present

## 2023-09-19 NOTE — Progress Notes (Signed)
Hematology/Oncology Consult note [facility]  [phone] [phone]  Patient Care Team: [name], NP as PCP - General (Nurse Practitioner) Debbe Odea, MD as PCP - Cardiology (Cardiology) Mickle Mallory (Gastroenterology) Merri Ray, MD as Referring Physician (Physical Medicine and Rehabilitation) Maxie Barb, Ian Bushman, MD (Neurology) Scarlett Presto, RN (Inactive) as Oncology Nurse Navigator   Name of the patient: Morgan Sanders  [id number]  09-17-1975   Date of visit: 09/19/23  Diagnosis-  pathological prognostic stage Ia invasive mammary carcinoma of the right breast and pT1b pN1 acM0 ER/PR positive HER2 negative   Chief complaint/ Reason for visit-routine follow-up of breast cancer on letrozole  Heme/Onc history: Patient is a 48 year old post menopausal female with a prior history of normocytic anemia for which she had a complete work-up including bone marrow biopsy as well as history of cirrhosis of the liver.  She is postmenopausal and she has not had any menstrual cycles since the age of 18.  Patient underwent a bilateral screening mammogram on 12/21/2020 which showed possible mass in the right breast.  This was followed by diagnostic mammogram and ultrasound which showed 2 masses in the right breast at 12 o'clock position each measuring 6 mm and 1.2 cm apart.  Ultrasound of the right axilla was negative for malignancy.  Patient underwent biopsy of both the medial and the lateral breast mass and it was consistent with invasive mammary carcinoma grade 2.  ER greater than 90% positive, PR greater than 90% positive and HER2 negative   Patient underwent right lumpectomy with sentinel lymph node biopsy Final pathology showed 2 tumors 7 mm in size both of which were grade 1.  The tissue in between did not have any evidence of malignancy or DCIS.  She was noted to have metastatic carcinoma in 2 out of 4 sentinel lymph node 6  mm deposit with no extracapsular extension.   MammaPrint score came back at low risk and therefore patient did not require any adjuvant chemotherapy.  Patient completed radiation treatment and started Letrozole in August 2022    Interval history- Patient is presently on letrozole and is tolerating it well without any significant side effects.  Denies any breast concerns at this time.  Appetite and weight have been stable.  Denies any new aches and pains anywhere.  She is also taking her calcium and vitamin D regularly.  ECOG PS- 0 Pain scale- 0   Review of systems- Review of Systems  Constitutional:  Negative for chills, fever, malaise/fatigue and weight loss.  HENT:  Negative for congestion, ear discharge and nosebleeds.   Eyes:  Negative for blurred vision.  Respiratory:  Negative for cough, hemoptysis, sputum production, shortness of breath and wheezing.   Cardiovascular:  Negative for chest pain, palpitations, orthopnea and claudication.  Gastrointestinal:  Negative for abdominal pain, blood in stool, constipation, diarrhea, heartburn, melena, nausea and vomiting.  Genitourinary:  Negative for dysuria, flank pain, frequency, hematuria and urgency.  Musculoskeletal:  Negative for back pain, joint pain and myalgias.  Skin:  Negative for rash.  Neurological:  Negative for dizziness, tingling, focal weakness, seizures, weakness and headaches.  Endo/Heme/Allergies:  Does not bruise/bleed easily.  Psychiatric/Behavioral:  Negative for depression and suicidal ideas. The patient does not have insomnia.       No Known Allergies   Past Medical History:  Diagnosis Date   Allergy    Anemia    Anxiety    Asthma    Blood  transfusion without reported diagnosis    Breast cancer (HCC)    Cataract    Chicken pox    Colon polyps    Depression    Heart murmur    Hypertension    Neuromuscular disorder (HCC)    neuropathy   Personal history of radiation therapy    Pneumonia    Stomach  ulcer      Past Surgical History:  Procedure Laterality Date   BREAST BIOPSY Right 01/01/2021   u/s bx 12:00 1 cmfn positive x clip lateral   BREAST BIOPSY Right 01/01/2021   u/s bx 12:00 1cmfn positive vision medial   BREAST LUMPECTOMY     BREAST LUMPECTOMY WITH SENTINEL LYMPH NODE BIOPSY Right 01/18/2021   Procedure: BREAST LUMPECTOMY WITH SENTINEL LYMPH NODE BX;  Surgeon: Earline Mayotte, MD;  Location: [facility] ORS;  Service: General;  Laterality: Right;   CHOLECYSTECTOMY     CHOLECYSTECTOMY, LAPAROSCOPIC  03/1996   COLONOSCOPY     ESOPHAGOGASTRODUODENOSCOPY (EGD) WITH PROPOFOL N/A 12/31/2018   Procedure: ESOPHAGOGASTRODUODENOSCOPY (EGD) WITH PROPOFOL;  Surgeon: Midge Minium, MD;  Location: [facility] ENDOSCOPY;  Service: Endoscopy;  Laterality: N/A;   TUBAL LIGATION     WISDOM TOOTH EXTRACTION      Social History   Socioeconomic History   Marital status: Married    Spouse name: Harvie Heck   Number of children: 2   Years of education: high school   Highest education level: GED or equivalent  Occupational History   Occupation: Golf/clubhouse attendent  Tobacco Use   Smoking status: Former    Current packs/day: 0.00    Types: Cigarettes    Quit date: 08/18/1998    Years since quitting: 25.1   Smokeless tobacco: Never   Tobacco comments:    Former cigarette smoker  Advertising account planner   Vaping status: Some Days   Substances: Nicotine, Flavoring  Substance and Sexual Activity   Alcohol use: Not Currently    Comment: a few times a year   Drug use: Never   Sexual activity: Yes    Birth control/protection: Post-menopausal, Other-see comments  Other Topics Concern   Not on file  Social History Narrative   02/07/20   From: the area   Living: with husband, Harvie Heck (2000)   Work: at a golf course      Family: daughters - CJ (lives at [place], 2 children) and Geologist, engineering (still living at home, in Social worker school)      Enjoys: golf       Exercise: golfing 3-4 times a week - alternating between  cart/walking   Diet: pretty good - eggs/fruit, eats lunch and dinner      Safety   Seat belts: Yes    Guns: Yes  and secure   Safe in relationships: Yes    Social Drivers of Corporate investment banker Strain: Low Risk  (01/21/2019)   Received from [facility], [facility]   Overall Financial Resource Strain (CARDIA)    Difficulty of Paying Living Expenses: Not hard at all  Food Insecurity: Unknown (01/21/2019)   Received from [facility], [facility]   Hunger Vital Sign    Worried About Running Out of Food in the Last Year: Patient declined    Ran Out of Food in the Last Year: Patient declined  Transportation Needs: No Transportation Needs (07/13/2023)   PRAPARE - Administrator, Civil Service (Medical): No    Lack of Transportation (Non-Medical): No  Physical  Activity: Unknown (07/13/2023)   Exercise Vital Sign    Days of Exercise per Week: 4 days    Minutes of Exercise per Session: Patient declined  Stress: No Stress Concern Present (01/21/2019)   Received from [facility], [facility]   [facility] of Occupational Health - Occupational Stress Questionnaire    Feeling of Stress : Not at all  Social Connections: Unknown (07/13/2023)   Social Connection and Isolation Panel [NHANES]    Frequency of Communication with Friends and Family: More than three times a week    Frequency of Social Gatherings with Friends and Family: Once a week    Attends Religious Services: Patient declined    Database administrator or Organizations: Yes    Attends Banker Meetings: Patient declined    Marital Status: Married  Catering manager Violence: Unknown (01/21/2019)   Received from [facility], [facility]   Humiliation, Afraid, Rape, and Kick questionnaire    Fear of Current or Ex-Partner: Patient declined    Emotionally Abused: Patient declined    Physically Abused: Patient declined    Sexually Abused: Patient declined    Family  History  Problem Relation Age of Onset   Hypertension Mother    Diabetes Mother    Hyperlipidemia Mother    Kidney disease Mother    Arthritis Mother    Asthma Mother    Depression Mother    Sudden death Father        hit by train   Liver disease Father        hx of liver transplant   Early death Father    Hyperlipidemia Sister    Hypertension Sister    Breast cancer Maternal Grandmother 70   Arthritis Maternal Grandmother    Cancer Maternal Grandmother    Diabetes Maternal Grandmother    Hearing loss Maternal Grandmother    Hypertension Maternal Grandmother    Early death Maternal Grandfather    Heart attack Paternal Grandmother        passed from heart attach   Cataracts Paternal Grandfather    Lung cancer Paternal Grandfather    Depression Daughter    Depression Daughter    Bone cancer Maternal Great-grandmother      Current Outpatient Medications:    albuterol (VENTOLIN HFA) 108 (90 Base) MCG/ACT inhaler, Inhale 2 puffs into the lungs every 4 (four) hours as needed for shortness of breath or wheezing., Disp: 18 each, Rfl: 1   ALPRAZolam (XANAX) 0.5 MG tablet, TAKE 1 TABLET BY MOUTH EVERY DAY AS NEEDED FOR ANXIETY, Disp: 30 tablet, Rfl: 5   budesonide-formoterol (SYMBICORT) 80-4.5 MCG/ACT inhaler, Inhale 2 puffs into the lungs 2 (two) times daily. Rinse mouth out after each use, Disp: 1 each, Rfl: 12   Cholecalciferol (VITAMIN D3) 125 MCG (5000 UT) CAPS, Take 5,000 Units by mouth daily., Disp: , Rfl:    escitalopram (LEXAPRO) 20 MG tablet, Take 1 tablet (20 mg total) by mouth daily., Disp: 90 tablet, Rfl: 3   famotidine (PEPCID) 20 MG tablet, Take 20 mg by mouth 2 (two) times daily., Disp: , Rfl:    fenofibrate (TRICOR) 145 MG tablet, Take 1 tablet (145 mg total) by mouth daily., Disp: 90 tablet, Rfl: 3   letrozole (FEMARA) 2.5 MG tablet, TAKE 1 TABLET BY MOUTH EVERY DAY, Disp: 90 tablet, Rfl: 2   montelukast (SINGULAIR) 10 MG tablet, TAKE 1 TABLET BY MOUTH EVERYDAY AT  BEDTIME, Disp: 90 tablet, Rfl: 3  Potassium Chloride ER 20 MEQ TBCR, TAKE 1 TABLET BY MOUTH EVERY DAY, Disp: 90 tablet, Rfl: 2   pregabalin (LYRICA) 200 MG capsule, Take 200 mg by mouth 2 (two) times daily., Disp: , Rfl:    rosuvastatin (CRESTOR) 10 MG tablet, Take 1 tablet (10 mg total) by mouth daily., Disp: 90 tablet, Rfl: 3  Physical exam:  Vitals:   09/18/23 1407  BP: 138/75  Pulse: 76  Resp: 18  Temp: 98.7 F (37.1 C)  TempSrc: Tympanic  SpO2: 97%  Weight: 205 lb (93 kg)   Physical Exam Cardiovascular:     Rate and Rhythm: Normal rate and regular rhythm.     Heart sounds: Normal heart sounds.  Pulmonary:     Effort: Pulmonary effort is normal.     Breath sounds: Normal breath sounds.  Skin:    General: Skin is warm and dry.  Neurological:     Mental Status: She is alert and oriented to person, place, and time.   Breast exam was performed in seated and lying down position. Patient is status post right lumpectomy with a well-healed surgical scar. No evidence of any palpable masses. No evidence of axillary adenopathy. No evidence of any palpable masses or lumps in the left breast. No evidence of leftt axillary adenopathy       Latest Ref Rng & Units 07/14/2023    3:04 PM  CMP  Glucose 70 - 99 mg/dL 97   BUN 6 - 23 mg/dL 16   Creatinine 9.14 - 1.20 mg/dL 7.82   Sodium 956 - 213 mEq/L 140   Potassium 3.5 - 5.1 mEq/L 4.4   Chloride 96 - 112 mEq/L 106   CO2 19 - 32 mEq/L 26   Calcium 8.4 - 10.5 mg/dL 9.6   Total Protein 6.0 - 8.3 g/dL 7.5   Total Bilirubin 0.2 - 1.2 mg/dL 0.6   Alkaline Phos 39 - 117 U/L 38   AST 0 - 37 U/L 28   ALT 0 - 35 U/L 26       Latest Ref Rng & Units 07/14/2023    3:04 PM  CBC  WBC 4.0 - 10.5 K/uL 5.8   Hemoglobin 12.0 - 15.0 g/dL 08.6   Hematocrit 57.8 - 46.0 % 40.7   Platelets 150.0 - 400.0 K/uL 196.0      Assessment and plan- Patient is a 48 y.o. female with pathological prognostic stage Ia invasive mammary carcinoma of the  right breast MPT [id number] ER/PR positive HER2 negative.  She is status postlumpectomy and adjuvant radiation treatment.  She is here for a routine follow-up visit of breast cancer currently on letrozole  Clinically patient is doing well with no concerning signs and symptoms of recurrence based on today's exam.  She will be due for a mammogram in May 2025 which I will schedule.  Also discussed the results of bone density scan from September 2024 which was within normal limits.  Plan is to continue letrozole for 5 years ending in August 2027 but we could potentially consider extending it for 5 mg given that she had lymph node positive disease   Visit Diagnosis 1. Encounter for follow-up surveillance of breast cancer   2. Use of letrozole (Femara)   3. Malignant neoplasm of right breast, stage 1, estrogen receptor positive (HCC)      Dr. Owens Shark, MD, MPH [facility] [phone] 09/19/2023 5:54 PM

## 2023-09-23 ENCOUNTER — Other Ambulatory Visit: Payer: Self-pay | Admitting: Medical Genetics

## 2023-10-06 ENCOUNTER — Telehealth (INDEPENDENT_AMBULATORY_CARE_PROVIDER_SITE_OTHER): Payer: BC Managed Care – PPO | Admitting: Nurse Practitioner

## 2023-10-06 ENCOUNTER — Other Ambulatory Visit: Payer: Self-pay | Admitting: Nurse Practitioner

## 2023-10-06 ENCOUNTER — Telehealth: Payer: Self-pay

## 2023-10-06 VITALS — Ht 65.5 in | Wt 202.0 lb

## 2023-10-06 DIAGNOSIS — E669 Obesity, unspecified: Secondary | ICD-10-CM | POA: Insufficient documentation

## 2023-10-06 DIAGNOSIS — T7840XD Allergy, unspecified, subsequent encounter: Secondary | ICD-10-CM

## 2023-10-06 MED ORDER — ZEPBOUND 5 MG/0.5ML ~~LOC~~ SOAJ
5.0000 mg | SUBCUTANEOUS | 0 refills | Status: DC
Start: 2023-10-06 — End: 2023-11-24

## 2023-10-06 MED ORDER — ZEPBOUND 2.5 MG/0.5ML ~~LOC~~ SOAJ
2.5000 mg | SUBCUTANEOUS | 0 refills | Status: DC
Start: 2023-10-06 — End: 2023-11-24

## 2023-10-06 NOTE — Telephone Encounter (Signed)
 PA needed for zepbound

## 2023-10-06 NOTE — Progress Notes (Signed)
MyChart Video Visit    Virtual Visit via Video Note   This visit type was conducted because this format is felt to be most appropriate for this patient at this time. Physical exam was limited by quality of the video and audio technology used for the visit. CMA was able to get the patient set up on a video visit.  Patient location: Home. Patient and provider in visit Provider location: Office  I discussed the limitations of evaluation and management by telemedicine and the availability of in person appointments. The patient expressed understanding and agreed to proceed.  Visit Date: 10/06/2023  Today's healthcare provider: [name], NP     Subjective:    Patient ID: Morgan Sanders, female    DOB: 1975-09-30, 48 y.o.   MRN: [id number]  Chief Complaint  Patient presents with   Weight Check    HPI Discussed the use of AI scribe software for clinical note transcription with the patient, who gave verbal consent to proceed.  History of Present Illness   Morgan MCKENZIE "[name]" is a 48 year old female who presents with concerns about weight loss and interest in weight loss medication.  She is concerned about her weight loss progress, having lost a few pounds but not as much as anticipated. She is interested in discussing the use of a weight loss injection pen, specifically Zepbound, which her insurance will cover.  She has made several dietary changes to aid in weight loss. She has increased her intake of vegetables and decreased red meat consumption, maintaining normal portion sizes of chicken and pork. She avoids fried and processed foods, including fast food and processed lunch meats, and prefers fresh or frozen vegetables over canned ones. She has also eliminated sugary drinks, primarily drinking water, and only occasionally consuming soda. She is active and plays golf regularly, weather permitting.   There is no personal or family history of thyroid cancer, which is  relevant to her consideration of the weight loss medication.      Past Medical History:  Diagnosis Date   Allergy    Anemia    Anxiety    Asthma    Blood transfusion without reported diagnosis    Breast cancer (HCC)    Cataract    Chicken pox    Colon polyps    Depression    Heart murmur    Hypertension    Neuromuscular disorder (HCC)    neuropathy   Personal history of radiation therapy    Pneumonia    Stomach ulcer     Past Surgical History:  Procedure Laterality Date   BREAST BIOPSY Right 01/01/2021   u/s bx 12:00 1 cmfn positive x clip lateral   BREAST BIOPSY Right 01/01/2021   u/s bx 12:00 1cmfn positive vision medial   BREAST LUMPECTOMY     BREAST LUMPECTOMY WITH SENTINEL LYMPH NODE BIOPSY Right 01/18/2021   Procedure: BREAST LUMPECTOMY WITH SENTINEL LYMPH NODE BX;  Surgeon: Earline Mayotte, MD;  Location: [facility] OR[facility];  Service: General;  Laterality: Right;   CHOLECYSTECTOMY     CHOLECYSTECTOMY, LAPAROSCOPIC  03/1996   COLONOSCOPY     ESOPHAGOGASTRODUODENOSCOPY (EGD) WITH PROPOFOL N/A 12/31/2018   Procedure: ESOPHAGOGASTRODUODENOSCOPY (EGD) WITH PROPOFOL;  Surgeon: Midge Minium, MD;  Location: [facility];  Service: Endoscopy;  Laterality: N/A;   TUBAL LIGATION     WISDOM TOOTH EXTRACTION      Family History  Problem Relation Age of Onset   Hypertension Mother  Diabetes Mother    Hyperlipidemia Mother    Kidney disease Mother    Arthritis Mother    Asthma Mother    Depression Mother    Sudden death Father        hit by train   Liver disease Father        hx of liver transplant   Early death Father    Hyperlipidemia Sister    Hypertension Sister    Breast cancer Maternal Grandmother 90   Arthritis Maternal Grandmother    Cancer Maternal Grandmother    Diabetes Maternal Grandmother    Hearing loss Maternal Grandmother    Hypertension Maternal Grandmother    Early death Maternal Grandfather    Heart attack Paternal Grandmother        passed  from heart attach   Cataracts Paternal Grandfather    Lung cancer Paternal Grandfather    Depression Daughter    Depression Daughter    Bone cancer Maternal Great-grandmother     Social History   Socioeconomic History   Marital status: Married    Spouse name: Harvie Heck   Number of children: 2   Years of education: high school   Highest education level: GED or equivalent  Occupational History   Occupation: Golf/clubhouse attendent  Tobacco Use   Smoking status: Former    Current packs/day: 0.00    Types: Cigarettes    Quit date: 08/18/1998    Years since quitting: 25.1   Smokeless tobacco: Never   Tobacco comments:    Former cigarette smoker  Advertising account planner   Vaping status: Some Days   Substances: Nicotine, Flavoring  Substance and Sexual Activity   Alcohol use: Not Currently    Comment: a few times a year   Drug use: Never   Sexual activity: Yes    Birth control/protection: Post-menopausal, Other-see comments  Other Topics Concern   Not on file  Social History Narrative   02/07/20   From: the area   Living: with husband, Harvie Heck (2000)   Work: at a golf course      Family: daughters - CJ (lives at [place], 2 children) and Geologist, engineering (still living at home, in Social worker school)      Enjoys: golf       Exercise: golfing 3-4 times a week - alternating between cart/walking   Diet: pretty good - eggs/fruit, eats lunch and dinner      Safety   Seat belts: Yes    Guns: Yes  and secure   Safe in relationships: Yes    Social Drivers of Corporate investment banker Strain: Low Risk  (10/06/2023)   Overall Financial Resource Strain (CARDIA)    Difficulty of Paying Living Expenses: Not very hard  Food Insecurity: No Food Insecurity (10/06/2023)   Hunger Vital Sign    Worried About Running Out of Food in the Last Year: Never true    Ran Out of Food in the Last Year: Never true  Transportation Needs: No Transportation Needs (10/06/2023)   PRAPARE - Scientist, research (physical sciences) (Medical): No    Lack of Transportation (Non-Medical): No  Physical Activity: Insufficiently Active (10/06/2023)   Exercise Vital Sign    Days of Exercise per Week: 5 days    Minutes of Exercise per Session: 20 min  Stress: No Stress Concern Present (10/06/2023)   [employer] of Occupational Health - Occupational Stress Questionnaire    Feeling of Stress : Not at all  Social Connections:  Unknown (10/06/2023)   Social Connection and Isolation Panel [NHANES]    Frequency of Communication with Friends and Family: More than three times a week    Frequency of Social Gatherings with Friends and Family: Once a week    Attends Religious Services: Patient declined    Database administrator or Organizations: No    Attends Banker Meetings: Patient declined    Marital Status: Married  Catering manager Violence: Unknown (01/21/2019)   Received from [facility]   Humiliation, Afraid, Rape, and Kick questionnaire    Fear of Current or Ex-Partner: Patient declined    Emotionally Abused: Patient declined    Physically Abused: Patient declined    Sexually Abused: Patient declined    Outpatient Medications Prior to Visit  Medication Sig Dispense Refill   albuterol (VENTOLIN HFA) 108 (90 Base) MCG/ACT inhaler Inhale 2 puffs into the lungs every 4 (four) hours as needed for shortness of breath or wheezing. 18 each 1   ALPRAZolam (XANAX) 0.5 MG tablet TAKE 1 TABLET BY MOUTH EVERY DAY AS NEEDED FOR ANXIETY 30 tablet 5   budesonide-formoterol (SYMBICORT) 80-4.5 MCG/ACT inhaler Inhale 2 puffs into the lungs 2 (two) times daily. Rinse mouth out after each use 1 each 12   Cholecalciferol (VITAMIN D3) 125 MCG (5000 UT) CAPS Take 5,000 Units by mouth daily.     escitalopram (LEXAPRO) 20 MG tablet Take 1 tablet (20 mg total) by mouth daily. 90 tablet 3   famotidine (PEPCID) 20 MG tablet Take 20 mg by mouth 2 (two) times daily.     fenofibrate (TRICOR) 145 MG  tablet Take 1 tablet (145 mg total) by mouth daily. 90 tablet 3   letrozole (FEMARA) 2.5 MG tablet TAKE 1 TABLET BY MOUTH EVERY DAY 90 tablet 2   montelukast (SINGULAIR) 10 MG tablet TAKE 1 TABLET BY MOUTH EVERYDAY AT BEDTIME 90 tablet 3   Potassium Chloride ER 20 MEQ TBCR TAKE 1 TABLET BY MOUTH EVERY DAY 90 tablet 2   pregabalin (LYRICA) 200 MG capsule Take 200 mg by mouth 2 (two) times daily.     rosuvastatin (CRESTOR) 10 MG tablet Take 1 tablet (10 mg total) by mouth daily. 90 tablet 3   No facility-administered medications prior to visit.    No Known Allergies  ROS See HPI    Objective:    Physical Exam  Ht 5' 5.5" (1.664 m)   Wt 202 lb (91.6 kg)   BMI 33.10 kg/m  Wt Readings from Last 3 Encounters:  10/06/23 202 lb (91.6 kg)  09/18/23 205 lb (93 kg)  07/14/23 194 lb (88 kg)   GENERAL: alert, oriented, appears well and in no acute distress   HEENT: atraumatic, conjunttiva clear, no obvious abnormalities on inspection of external nose and ears   NECK: normal movements of the head and neck   LUNGS: on inspection no signs of respiratory distress, breathing rate appears normal, no obvious gross SOB, gasping or wheezing   CV: no obvious cyanosis   MS: moves all visible extremities without noticeable abnormality   PSYCH/NEURO: pleasant and cooperative, no obvious depression or anxiety, speech and thought processing grossly intact    Assessment & Plan:   Problem List Items Addressed This Visit       Other   Obesity (BMI 30-39.9) - Primary   Efforts to lose weight through diet changes have not yielded desired results. Discussed starting Zepbound injections. Will start the patient on Zepbound 2.5  mg weekly, increasing the dose every 4 weeks as tolerated. Counseled on the risk of pancreatitis and gallbladder disease. Discussed the risk of nausea. Advised to discontinue the Zepbound and contact us immediately if they develop abdominal pain. If they develop excessive nausea  they will contact us right away. I discussed that medullary thyroid cancer has been seen in rats studies. The patient confirmed no personal or family history of thyroid cancer, parathyroid cancer, or adrenal gland cancer. Discussed that we thus far have not seen medullary thyroid cancer result from use of this type of medication in humans. Advised to monitor the thyroid area and contact us for any lumps, swelling, trouble swallowing, or any other changes in this area.  Discussed goal weight loss of 1 to 2 pounds a week while on this medication. Discussed the importance of healthy diet, exercise and lifestyle modifications even with this medication. Encourage a high protein, low carbohydrate diet, avoiding processed and fried foods. Reassess weight and tolerance to Zepbound in 6 weeks.       Relevant Medications   tirzepatide (ZEPBOUND) 2.5 MG/0.5ML Pen   tirzepatide (ZEPBOUND) 5 MG/0.5ML Pen    I am having Morgan L. Frisbie "Mandy" start on Zepbound and Zepbound. I am also having her maintain her famotidine, Vitamin D3, pregabalin, budesonide-formoterol, escitalopram, montelukast, fenofibrate, letrozole, albuterol, ALPRAZolam, rosuvastatin, and Potassium Chloride ER.  Meds ordered this encounter  Medications   tirzepatide (ZEPBOUND) 2.5 MG/0.5ML Pen    Sig: Inject 2.5 mg into the skin once a week.    Dispense:  2 mL    Refill:  0    Supervising Provider:   Birdie Sons, ERIC G [[id number]]   tirzepatide (ZEPBOUND) 5 MG/0.5ML Pen    Sig: Inject 5 mg into the skin once a week.    Dispense:  2 mL    Refill:  0    Supervising Provider:   Birdie Sons, ERIC G [[id number]]   I discussed the assessment and treatment plan with the patient. The patient was provided an opportunity to ask questions and all were answered. The patient agreed with the plan and demonstrated an understanding of the instructions.   The patient was advised to call back or seek an in-person evaluation if the symptoms worsen or if the condition  fails to improve as anticipated.   [name], NP [facility] [phone] (phone) [phone] (fax)  [facility]

## 2023-10-06 NOTE — Assessment & Plan Note (Signed)
Efforts to lose weight through diet changes have not yielded desired results. Discussed starting Zepbound injections. Will start the patient on Zepbound 2.5 mg weekly, increasing the dose every 4 weeks as tolerated. Counseled on the risk of pancreatitis and gallbladder disease. Discussed the risk of nausea. Advised to discontinue the Zepbound and contact us immediately if they develop abdominal pain. If they develop excessive nausea they will contact us right away. I discussed that medullary thyroid cancer has been seen in rats studies. The patient confirmed no personal or family history of thyroid cancer, parathyroid cancer, or adrenal gland cancer. Discussed that we thus far have not seen medullary thyroid cancer result from use of this type of medication in humans. Advised to monitor the thyroid area and contact us for any lumps, swelling, trouble swallowing, or any other changes in this area.  Discussed goal weight loss of 1 to 2 pounds a week while on this medication. Discussed the importance of healthy diet, exercise and lifestyle modifications even with this medication. Encourage a high protein, low carbohydrate diet, avoiding processed and fried foods. Reassess weight and tolerance to Zepbound in 6 weeks.

## 2023-10-07 ENCOUNTER — Ambulatory Visit: Payer: BC Managed Care – PPO | Admitting: Nurse Practitioner

## 2023-10-07 ENCOUNTER — Telehealth: Payer: Self-pay

## 2023-10-07 ENCOUNTER — Other Ambulatory Visit (HOSPITAL_COMMUNITY): Payer: Self-pay

## 2023-10-07 ENCOUNTER — Other Ambulatory Visit: Payer: Self-pay

## 2023-10-07 NOTE — Telephone Encounter (Signed)
PA request has been Submitted. New Encounter created for follow up. For additional info see Pharmacy Prior Auth telephone encounter from 10/07/23.

## 2023-10-07 NOTE — Telephone Encounter (Signed)
Pharmacy Patient Advocate Encounter  Received notification from [facility] that Prior Authorization for Zepbound 2.5MG /0.5ML pen-injectors has been APPROVED from 09/07/23 to 06/03/24. Ran test claim, Copay is $24.99. This test claim was processed through [facility]- copay amounts may vary at other pharmacies due to pharmacy/plan contracts, or as the patient moves through the different stages of their insurance plan.   PA #/Case ID/Reference #: [id number]

## 2023-10-07 NOTE — Telephone Encounter (Signed)
Pharmacy Patient Advocate Encounter   Received notification from Pt Calls Messages that prior authorization for ZEPBOUND is required/requested.   Insurance verification completed.   The patient is insured through [employer] .   Per test claim: PA required; PA submitted to above mentioned insurance via CoverMyMeds Key/confirmation #/EOC [id number] Status is pending

## 2023-10-07 NOTE — Telephone Encounter (Signed)
 Pt is aware and gave a verbal understanding.

## 2023-10-23 ENCOUNTER — Other Ambulatory Visit: Payer: Medicaid Other | Attending: Medical Genetics

## 2023-11-12 ENCOUNTER — Telehealth: Payer: Self-pay | Admitting: Nurse Practitioner

## 2023-11-12 NOTE — Telephone Encounter (Signed)
 Copied from CRM [id number]. Topic: Clinical - Medication Question >> Nov 12, 2023  3:41 PM Gurney Maxin H wrote: Reason for CRM: Patient is calling to ask when should she stop taking her tirzepatide (ZEPBOUND) 5 MG/0.5ML Pen, states she has a colonoscopy scheduled for 4/4, please reach out to patient, thanks.  Pranathi [phone]

## 2023-11-13 NOTE — Telephone Encounter (Signed)
 Pt has been informed.

## 2023-11-19 ENCOUNTER — Ambulatory Visit: Admitting: Family Medicine

## 2023-11-19 ENCOUNTER — Ambulatory Visit: Payer: BC Managed Care – PPO | Admitting: Nurse Practitioner

## 2023-11-19 ENCOUNTER — Encounter: Payer: Self-pay | Admitting: Gastroenterology

## 2023-11-20 ENCOUNTER — Ambulatory Visit
Admission: RE | Admit: 2023-11-20 | Discharge: 2023-11-20 | Disposition: A | Discharge status: Home / Self Care | Source: Ambulatory Visit | Attending: Gastroenterology | Admitting: Gastroenterology

## 2023-11-20 ENCOUNTER — Encounter: Payer: Self-pay | Admitting: Gastroenterology

## 2023-11-20 ENCOUNTER — Ambulatory Visit: Admitting: Anesthesiology

## 2023-11-20 ENCOUNTER — Encounter
Admission: RE | Disposition: A | Discharge status: Home / Self Care | Payer: Self-pay | Source: Ambulatory Visit | Attending: Gastroenterology

## 2023-11-20 ENCOUNTER — Other Ambulatory Visit: Payer: Self-pay

## 2023-11-20 DIAGNOSIS — D123 Benign neoplasm of transverse colon: Secondary | ICD-10-CM | POA: Diagnosis not present

## 2023-11-20 DIAGNOSIS — I10 Essential (primary) hypertension: Secondary | ICD-10-CM | POA: Diagnosis not present

## 2023-11-20 DIAGNOSIS — K2970 Gastritis, unspecified, without bleeding: Secondary | ICD-10-CM | POA: Diagnosis not present

## 2023-11-20 DIAGNOSIS — D128 Benign neoplasm of rectum: Secondary | ICD-10-CM | POA: Insufficient documentation

## 2023-11-20 DIAGNOSIS — K319 Disease of stomach and duodenum, unspecified: Secondary | ICD-10-CM | POA: Insufficient documentation

## 2023-11-20 DIAGNOSIS — K5730 Diverticulosis of large intestine without perforation or abscess without bleeding: Secondary | ICD-10-CM | POA: Insufficient documentation

## 2023-11-20 DIAGNOSIS — K635 Polyp of colon: Secondary | ICD-10-CM | POA: Diagnosis not present

## 2023-11-20 DIAGNOSIS — Z860101 Personal history of adenomatous and serrated colon polyps: Secondary | ICD-10-CM | POA: Diagnosis not present

## 2023-11-20 DIAGNOSIS — K640 First degree hemorrhoids: Secondary | ICD-10-CM | POA: Insufficient documentation

## 2023-11-20 DIAGNOSIS — K649 Unspecified hemorrhoids: Secondary | ICD-10-CM | POA: Diagnosis not present

## 2023-11-20 DIAGNOSIS — Z1211 Encounter for screening for malignant neoplasm of colon: Secondary | ICD-10-CM | POA: Insufficient documentation

## 2023-11-20 DIAGNOSIS — K2289 Other specified disease of esophagus: Secondary | ICD-10-CM | POA: Diagnosis not present

## 2023-11-20 DIAGNOSIS — K621 Rectal polyp: Secondary | ICD-10-CM | POA: Diagnosis not present

## 2023-11-20 DIAGNOSIS — K2960 Other gastritis without bleeding: Secondary | ICD-10-CM | POA: Diagnosis not present

## 2023-11-20 HISTORY — PX: COLONOSCOPY: SHX5424

## 2023-11-20 HISTORY — PX: ESOPHAGOGASTRODUODENOSCOPY: SHX5428

## 2023-11-20 SURGERY — COLONOSCOPY
Anesthesia: General

## 2023-11-20 MED ORDER — SODIUM CHLORIDE 0.9 % IV SOLN: INTRAVENOUS | Status: DC

## 2023-11-20 MED ORDER — GLYCOPYRROLATE 0.2 MG/ML IJ SOLN
INTRAMUSCULAR | Status: DC | PRN
  Administered 2023-11-20: .2 mg via INTRAVENOUS

## 2023-11-20 MED ORDER — DEXMEDETOMIDINE HCL IN NACL 80 MCG/20ML IV SOLN
INTRAVENOUS | Status: DC | PRN
  Administered 2023-11-20: 20 ug via INTRAVENOUS

## 2023-11-20 MED ORDER — PROPOFOL 500 MG/50ML IV EMUL
INTRAVENOUS | Status: DC | PRN
  Administered 2023-11-20: 125 ug/kg/min via INTRAVENOUS

## 2023-11-20 MED ORDER — PROPOFOL 10 MG/ML IV BOLUS
INTRAVENOUS | Status: DC | PRN
  Administered 2023-11-20 (×2): 50 mg via INTRAVENOUS

## 2023-11-20 MED ORDER — PROPOFOL 1000 MG/100ML IV EMUL
INTRAVENOUS | Status: AC
  Filled 2023-11-20: qty 100

## 2023-11-20 MED ORDER — LIDOCAINE HCL (CARDIAC) PF 100 MG/5ML IV SOSY
PREFILLED_SYRINGE | INTRAVENOUS | Status: DC | PRN
  Administered 2023-11-20: 80 mg via INTRAVENOUS

## 2023-11-20 MED ORDER — GLYCOPYRROLATE 0.2 MG/ML IJ SOLN
INTRAMUSCULAR | Status: AC
  Filled 2023-11-20: qty 1

## 2023-11-20 NOTE — Anesthesia Postprocedure Evaluation (Signed)
 Anesthesia Post Note  Patient: Morgan Sanders  Procedure(s) Performed: COLONOSCOPY EGD (ESOPHAGOGASTRODUODENOSCOPY)  Patient location during evaluation: Endoscopy Anesthesia Type: General Level of consciousness: awake and alert Pain management: pain level controlled Vital Signs Assessment: post-procedure vital signs reviewed and stable Respiratory status: spontaneous breathing, nonlabored ventilation, respiratory function stable and patient connected to nasal cannula oxygen Cardiovascular status: blood pressure returned to baseline and stable Postop Assessment: no apparent nausea or vomiting Anesthetic complications: no   No notable events documented.   Last Vitals:  Vitals:   11/20/23 1154 11/20/23 1204  BP: 94/75 (!) 144/94  Pulse: 66   Resp: 19 12  Temp:  36.6 C  SpO2: 96%     Last Pain:  Vitals:   11/20/23 1204  TempSrc: Temporal  PainSc: 0-No pain                 Cleda Mccreedy Reathel Turi

## 2023-11-20 NOTE — Interval H&P Note (Signed)
 History and Physical Interval Note: Preprocedure H&P from 11/20/23  was reviewed and there was no interval change after seeing and examining the patient.  Written consent was obtained from the patient after discussion of risks, benefits, and alternatives. Patient has consented to proceed with Esophagogastroduodenoscopy and Colonoscopy with possible intervention   11/20/2023 11:07 AM  Morgan Sanders  has presented today for surgery, with the diagnosis of Hx of adenomatous colonic polyps (Z86.0101) Alcoholic cirrhosis of liver without ascites (CMS/HHS-HCC) (K70.30).  The various methods of treatment have been discussed with the patient and family. After consideration of risks, benefits and other options for treatment, the patient has consented to  Procedure(s): COLONOSCOPY (N/A) EGD (ESOPHAGOGASTRODUODENOSCOPY) (N/A) as a surgical intervention.  The patient's history has been reviewed, patient examined, no change in status, stable for surgery.  I have reviewed the patient's chart and labs.  Questions were answered to the patient's satisfaction.     Jaynie Collins

## 2023-11-20 NOTE — Anesthesia Preprocedure Evaluation (Signed)
 Anesthesia Evaluation  Patient identified by MRN, date of birth, ID band Patient awake    Reviewed: Allergy & Precautions, NPO status , Patient's Chart, lab work & pertinent test results  History of Anesthesia Complications Negative for: history of anesthetic complications  Airway Mallampati: III  TM Distance: <3 FB Neck ROM: full    Dental  (+) Chipped, Poor Dentition   Pulmonary neg shortness of breath, asthma , former smoker   Pulmonary exam normal        Cardiovascular Exercise Tolerance: Good hypertension, (-) angina Normal cardiovascular exam     Neuro/Psych  PSYCHIATRIC DISORDERS       Neuromuscular disease    GI/Hepatic Neg liver ROS, PUD,GERD  Controlled,,  Endo/Other  negative endocrine ROS    Renal/GU negative Renal ROS  negative genitourinary   Musculoskeletal   Abdominal   Peds  Hematology negative hematology ROS (+)   Anesthesia Other Findings Past Medical History: No date: Allergy No date: Anemia No date: Anxiety No date: Asthma No date: Blood transfusion without reported diagnosis No date: Breast cancer (HCC) No date: Cataract No date: Chicken pox No date: Colon polyps No date: Depression No date: Heart murmur No date: Hypertension No date: Neuromuscular disorder (HCC)     Comment:  neuropathy No date: Personal history of radiation therapy No date: Pneumonia No date: Stomach ulcer  Past Surgical History: 01/01/2021: BREAST BIOPSY; Right     Comment:  u/s bx 12:00 1 cmfn positive x clip lateral 01/01/2021: BREAST BIOPSY; Right     Comment:  u/s bx 12:00 1cmfn positive vision medial No date: BREAST LUMPECTOMY 01/18/2021: BREAST LUMPECTOMY WITH SENTINEL LYMPH NODE BIOPSY; Right     Comment:  Procedure: BREAST LUMPECTOMY WITH SENTINEL LYMPH NODE               BX;  Surgeon: Earline Mayotte, MD;  Location: [facility]               ORS;  Service: General;  Laterality: Right; No date:  CHOLECYSTECTOMY 03/1996: CHOLECYSTECTOMY, LAPAROSCOPIC No date: COLONOSCOPY 12/31/2018: ESOPHAGOGASTRODUODENOSCOPY (EGD) WITH PROPOFOL; N/A     Comment:  Procedure: ESOPHAGOGASTRODUODENOSCOPY (EGD) WITH               PROPOFOL;  Surgeon: Midge Minium, MD;  Location: [facility]               ENDOSCOPY;  Service: Endoscopy;  Laterality: N/A; No date: TUBAL LIGATION No date: WISDOM TOOTH EXTRACTION     Reproductive/Obstetrics negative OB ROS                             Anesthesia Physical Anesthesia Plan  ASA: 3  Anesthesia Plan: General   Post-op Pain Management:    Induction: Intravenous  PONV Risk Score and Plan: Propofol infusion and TIVA  Airway Management Planned: Natural Airway and Nasal Cannula  Additional Equipment:   Intra-op Plan:   Post-operative Plan:   Informed Consent: I have reviewed the patients History and Physical, chart, labs and discussed the procedure including the risks, benefits and alternatives for the proposed anesthesia with the patient or authorized representative who has indicated his/her understanding and acceptance.     Dental Advisory Given  Plan Discussed with: Anesthesiologist, CRNA and Surgeon  Anesthesia Plan Comments: (Patient consented for risks of anesthesia including but not limited to:  - adverse reactions to medications - risk of airway placement if required - damage to eyes, teeth, lips  or other oral mucosa - nerve damage due to positioning  - sore throat or hoarseness - Damage to heart, brain, nerves, lungs, other parts of body or loss of life  Patient voiced understanding and assent.)       Anesthesia Quick Evaluation

## 2023-11-20 NOTE — Transfer of Care (Signed)
 Immediate Anesthesia Transfer of Care Note  Patient: Morgan Sanders  Procedure(s) Performed: COLONOSCOPY EGD (ESOPHAGOGASTRODUODENOSCOPY)  Patient Location: PACU  Anesthesia Type:General  Level of Consciousness: sedated  Airway & Oxygen Therapy: Patient Spontanous Breathing  Post-op Assessment: Report given to RN and Post -op Vital signs reviewed and stable  Post vital signs: Reviewed and stable  Last Vitals:  Vitals Value Taken Time  BP 105/58 11/20/23 1144  Temp    Pulse 72 11/20/23 1145  Resp 21 11/20/23 1145  SpO2 94 % 11/20/23 1145  Vitals shown include unfiled device data.  Last Pain:  Vitals:   11/20/23 1144  PainSc: Asleep         Complications: No notable events documented.

## 2023-11-20 NOTE — H&P (Signed)
 Pre-Procedure H&P   Patient ID: Morgan Sanders is a 48 y.o. female.  Gastroenterology Provider: Jaynie Collins, DO  Referring Provider: Jacob Moores, PA PCP: [name], NP  Date: 11/20/2023  HPI Morgan Sanders is a 48 y.o. female who presents today for Esophagogastroduodenoscopy and Colonoscopy for Esophageal varices screening, personal history of colon polyps .  Patient with a personal history of polyps.  Underwent colonoscopy in December 2019 with 3 adenomatous polyps sigmoid diverticulosis and internal hemorrhoids.  Biopsies negative for microscopic colitis EGD in May 2020 demonstrated irregular Z-line and gastric ulcers  Patient with cirrhosis secondary to alcohol use.  Denies any other GI symptoms.  Status postcholecystectomy  INR 0.9 hemoglobin 13.4 MCV 95 platelets 184,000   Past Medical History:  Diagnosis Date   Allergy    Anemia    Anxiety    Asthma    Blood transfusion without reported diagnosis    Breast cancer (HCC)    Cataract    Chicken pox    Colon polyps    Depression    Heart murmur    Hypertension    Neuromuscular disorder (HCC)    neuropathy   Personal history of radiation therapy    Pneumonia    Stomach ulcer     Past Surgical History:  Procedure Laterality Date   BREAST BIOPSY Right 01/01/2021   u/s bx 12:00 1 cmfn positive x clip lateral   BREAST BIOPSY Right 01/01/2021   u/s bx 12:00 1cmfn positive vision medial   BREAST LUMPECTOMY     BREAST LUMPECTOMY WITH SENTINEL LYMPH NODE BIOPSY Right 01/18/2021   Procedure: BREAST LUMPECTOMY WITH SENTINEL LYMPH NODE BX;  Surgeon: Earline Mayotte, MD;  Location: [facility] ORS;  Service: General;  Laterality: Right;   CHOLECYSTECTOMY     CHOLECYSTECTOMY, LAPAROSCOPIC  03/1996   COLONOSCOPY     ESOPHAGOGASTRODUODENOSCOPY (EGD) WITH PROPOFOL N/A 12/31/2018   Procedure: ESOPHAGOGASTRODUODENOSCOPY (EGD) WITH PROPOFOL;  Surgeon: Midge Minium, MD;  Location: [facility] ENDOSCOPY;  Service:  Endoscopy;  Laterality: N/A;   TUBAL LIGATION     WISDOM TOOTH EXTRACTION      Family History Father- cirrhosis; h/o liver transplant No h/o GI disease or malignancy  Review of Systems  Constitutional:  Negative for activity change, appetite change, chills, diaphoresis, fatigue, fever and unexpected weight change.  HENT:  Negative for trouble swallowing and voice change.   Respiratory:  Negative for shortness of breath and wheezing.   Cardiovascular:  Negative for chest pain, palpitations and leg swelling.  Gastrointestinal:  Negative for abdominal distention, abdominal pain, anal bleeding, blood in stool, constipation, diarrhea, nausea, rectal pain and vomiting.  Musculoskeletal:  Negative for arthralgias and myalgias.  Skin:  Negative for color change and pallor.  Neurological:  Negative for dizziness, syncope and weakness.  Psychiatric/Behavioral:  Negative for confusion.   All other systems reviewed and are negative.    Medications No current facility-administered medications on file prior to encounter.   Current Outpatient Medications on File Prior to Encounter  Medication Sig Dispense Refill   albuterol (VENTOLIN HFA) 108 (90 Base) MCG/ACT inhaler INHALE 2 PUFFS INTO THE LUNGS EVERY 4 (FOUR) HOURS AS NEEDED FOR SHORTNESS OF BREATH OR WHEEZING 8.5 each 1   ALPRAZolam (XANAX) 0.5 MG tablet TAKE 1 TABLET BY MOUTH EVERY DAY AS NEEDED FOR ANXIETY 30 tablet 5   budesonide-formoterol (SYMBICORT) 80-4.5 MCG/ACT inhaler Inhale 2 puffs into the lungs 2 (two) times daily. Rinse mouth out after each use 1  each 12   Cholecalciferol (VITAMIN D3) 125 MCG (5000 UT) CAPS Take 5,000 Units by mouth daily.     escitalopram (LEXAPRO) 20 MG tablet Take 1 tablet (20 mg total) by mouth daily. 90 tablet 3   famotidine (PEPCID) 20 MG tablet Take 20 mg by mouth 2 (two) times daily.     fenofibrate (TRICOR) 145 MG tablet Take 1 tablet (145 mg total) by mouth daily. 90 tablet 3   letrozole (FEMARA) 2.5 MG  tablet TAKE 1 TABLET BY MOUTH EVERY DAY 90 tablet 2   montelukast (SINGULAIR) 10 MG tablet TAKE 1 TABLET BY MOUTH EVERYDAY AT BEDTIME 90 tablet 3   Potassium Chloride ER 20 MEQ TBCR TAKE 1 TABLET BY MOUTH EVERY DAY 90 tablet 2   pregabalin (LYRICA) 200 MG capsule Take 200 mg by mouth 2 (two) times daily.     rosuvastatin (CRESTOR) 10 MG tablet Take 1 tablet (10 mg total) by mouth daily. 90 tablet 3   tirzepatide (ZEPBOUND) 2.5 MG/0.5ML Pen Inject 2.5 mg into the skin once a week. 2 mL 0   tirzepatide (ZEPBOUND) 5 MG/0.5ML Pen Inject 5 mg into the skin once a week. 2 mL 0    Pertinent medications related to GI and procedure were reviewed by me with the patient prior to the procedure   Current Facility-Administered Medications:    0.9 %  sodium chloride infusion, , Intravenous, Continuous, Jaynie Collins, DO, Last Rate: 20 mL/hr at 11/20/23 1043, Continued from Pre-op at 11/20/23 1043  sodium chloride 20 mL/hr at 11/20/23 1043       No Known Allergies Allergies were reviewed by me prior to the procedure  Objective   Body mass index is 31.79 kg/m. Vitals:   11/20/23 1019  BP: 130/81  Pulse: 79  Temp: 97.6 F (36.4 C)  SpO2: 99%  Weight: 88 kg     Physical Exam Vitals and nursing note reviewed.  Constitutional:      General: She is not in acute distress.    Appearance: Normal appearance. She is not ill-appearing, toxic-appearing or diaphoretic.  HENT:     Head: Normocephalic and atraumatic.     Nose: Nose normal.     Mouth/Throat:     Mouth: Mucous membranes are moist.     Pharynx: Oropharynx is clear.  Eyes:     General: No scleral icterus.    Extraocular Movements: Extraocular movements intact.  Cardiovascular:     Rate and Rhythm: Normal rate and regular rhythm.     Heart sounds: Normal heart sounds. No murmur heard.    No friction rub. No gallop.  Pulmonary:     Effort: Pulmonary effort is normal. No respiratory distress.     Breath sounds: Normal breath  sounds. No wheezing, rhonchi or rales.  Abdominal:     General: Bowel sounds are normal. There is no distension.     Palpations: Abdomen is soft.     Tenderness: There is no abdominal tenderness. There is no guarding or rebound.  Musculoskeletal:     Cervical back: Neck supple.     Right lower leg: No edema.     Left lower leg: No edema.  Skin:    General: Skin is warm and dry.     Coloration: Skin is not jaundiced or pale.  Neurological:     General: No focal deficit present.     Mental Status: She is alert and oriented to person, place, and time. Mental status is at baseline.  Psychiatric:        Mood and Affect: Mood normal.        Behavior: Behavior normal.        Thought Content: Thought content normal.        Judgment: Judgment normal.      Assessment:  Morgan Sanders is a 48 y.o. female  who presents today for Esophagogastroduodenoscopy and Colonoscopy for Esophageal varices screening, personal history of colon polyps .  Plan:  Esophagogastroduodenoscopy and Colonoscopy with possible intervention today  Esophagogastroduodenoscopy and Colonoscopy with possible biopsy, control of bleeding, polypectomy, and interventions as necessary has been discussed with the patient/patient representative. Informed consent was obtained from the patient/patient representative after explaining the indication, nature, and risks of the procedure including but not limited to death, bleeding, perforation, missed neoplasm/lesions, cardiorespiratory compromise, and reaction to medications. Opportunity for questions was given and appropriate answers were provided. Patient/patient representative has verbalized understanding is amenable to undergoing the procedure.   Jaynie Collins, DO  [facility]  Portions of the record may have been created with voice recognition software. Occasional wrong-word or 'sound-a-like' substitutions may have occurred due to the inherent  limitations of voice recognition software.  Read the chart carefully and recognize, using context, where substitutions may have occurred.

## 2023-11-20 NOTE — Care Plan (Signed)
 Pt left post op prior to being spoken to. Updated about bidirectional endoscopy results by phone. She verbalized understanding and was appreciative of the phone call.  Enis Slipper, DO [facility]

## 2023-11-20 NOTE — Op Note (Signed)
 [facility] Gastroenterology Patient Name: Morgan Sanders Procedure Date: 11/20/2023 11:09 AM MRN: [id number] Account #: [id number] Date of Birth: March 22, 1976 Admit Type: Outpatient Age: 48 Room: [facility] ENDO ROOM 1 Gender: Female Note Status: Finalized Instrument Name: Colonoscope [id number] Procedure:             Colonoscopy Indications:           High risk colon cancer surveillance: Personal history                         of colonic polyps Providers:             Trenda Moots, DO Referring MD:          [name] (Referring MD) Medicines:             Monitored Anesthesia Care Complications:         No immediate complications. Estimated blood loss:                         Minimal. Procedure:             Pre-Anesthesia Assessment:                        - Prior to the procedure, a History and Physical was                         performed, and patient medications and allergies were                         reviewed. The patient is competent. The risks and                         benefits of the procedure and the sedation options and                         risks were discussed with the patient. All questions                         were answered and informed consent was obtained.                         Patient identification and proposed procedure were                         verified by the physician, the nurse, the anesthetist                         and the technician in the endoscopy suite. Mental                         Status Examination: alert and oriented. Airway                         Examination: normal oropharyngeal airway and neck                         mobility. Respiratory Examination: clear to  auscultation. CV Examination: RRR, no murmurs, no S3                         or S4. Prophylactic Antibiotics: The patient does not                         require prophylactic antibiotics. Prior                          Anticoagulants: The patient has taken no anticoagulant                         or antiplatelet agents. ASA Grade Assessment: III - A                         patient with severe systemic disease. After reviewing                         the risks and benefits, the patient was deemed in                         satisfactory condition to undergo the procedure. The                         anesthesia plan was to use monitored anesthesia care                         (MAC). Immediately prior to administration of                         medications, the patient was re-assessed for adequacy                         to receive sedatives. The heart rate, respiratory                         rate, oxygen saturations, blood pressure, adequacy of                         pulmonary ventilation, and response to care were                         monitored throughout the procedure. The physical                         status of the patient was re-assessed after the                         procedure.                        After obtaining informed consent, the colonoscope was                         passed under direct vision. Throughout the procedure,                         the patient's blood pressure, pulse, and oxygen  saturations were monitored continuously. The                         Colonoscope was introduced through the anus and                         advanced to the the terminal ileum, with                         identification of the appendiceal orifice and IC                         valve. The colonoscopy was performed without                         difficulty. The patient tolerated the procedure well.                         The quality of the bowel preparation was evaluated                         using the BBPS [facility] Bowel Preparation Scale) with                         scores of: Right Colon = 3 (entire mucosa seen well                         with no residual staining,  small fragments of stool or                         opaque liquid), Transverse Colon = 3 (entire mucosa                         seen well with no residual staining, small fragments                         of stool or opaque liquid) and Left Colon = 2 (minor                         amount of residual staining, small fragments of stool                         and/or opaque liquid, but mucosa seen well). The total                         BBPS score equals 8. The quality of the bowel                         preparation was excellent. The terminal ileum,                         ileocecal valve, appendiceal orifice, and rectum were                         photographed. Findings:      The perianal and digital rectal examinations were normal. Pertinent       negatives include normal sphincter tone.  The terminal ileum appeared normal. Estimated blood loss: none.      Retroflexion in the right colon was performed.      Two sessile polyps were found in the rectum and transverse colon. The       polyps were 2 to 4 mm in size. These polyps were removed with a cold       snare. Resection and retrieval were complete. Estimated blood loss was       minimal.      Non-bleeding internal hemorrhoids were found during retroflexion. The       hemorrhoids were Grade I (internal hemorrhoids that do not prolapse).       Estimated blood loss: none.      The exam was otherwise without abnormality on direct and retroflexion       views. Impression:            - The examined portion of the ileum was normal.                        - Two 2 to 4 mm polyps in the rectum and in the                         transverse colon, removed with a cold snare. Resected                         and retrieved.                        - Non-bleeding internal hemorrhoids.                        - The examination was otherwise normal on direct and                         retroflexion views. Recommendation:        - Patient has a  contact number available for                         emergencies. The signs and symptoms of potential                         delayed complications were discussed with the patient.                         Return to normal activities tomorrow. Written                         discharge instructions were provided to the patient.                        - Discharge patient to home.                        - Resume previous diet.                        - Continue present medications.                        - No ibuprofen, naproxen, or other non-steroidal  anti-inflammatory drugs for 5 days after polyp removal.                        - Await pathology results.                        - Repeat colonoscopy for surveillance based on                         pathology results.                        - Return to GI office as previously scheduled.                        - The findings and recommendations were discussed with                         the patient. Procedure Code(s):     --- Professional ---                        [phone], Colonoscopy, flexible; with removal of                         tumor(s), polyp(s), or other lesion(s) by snare                         technique Diagnosis Code(s):     --- Professional ---                        Z86.010, Personal history of colonic polyps                        K64.0, First degree hemorrhoids                        D12.8, Benign neoplasm of rectum                        D12.3, Benign neoplasm of transverse colon (hepatic                         flexure or splenic flexure) CPT copyright 2022 American Medical Association. All rights reserved. The codes documented in this report are preliminary and upon coder review may  be revised to meet current compliance requirements. Attending Participation:      I personally performed the entire procedure. Elfredia Nevins, DO Jaynie Collins DO, DO 11/20/2023 11:45:08 AM This report has been  signed electronically. Number of Addenda: 0 Note Initiated On: 11/20/2023 11:09 AM Scope Withdrawal Time: 0 hours 14 minutes 2 seconds  Total Procedure Duration: 0 hours 15 minutes 45 seconds  Estimated Blood Loss:  Estimated blood loss was minimal.      [facility]

## 2023-11-20 NOTE — Op Note (Signed)
 [facility] Patient Name: Morgan Sanders Procedure Date: 11/20/2023 11:10 AM MRN: [id number] Account #: [id number] Date of Birth: 30-May-1976 Admit Type: Outpatient Age: 48 Room: [facility] ENDO ROOM 1 Gender: Female Note Status: Finalized Instrument Name: Upper Endoscope [id number] Procedure:             Upper GI endoscopy Indications:           Cirrhosis rule out esophageal varices Providers:             Trenda Moots, DO Referring MD:          [name] (Referring MD) Medicines:             Monitored Anesthesia Care Complications:         No immediate complications. Estimated blood loss:                         Minimal. Procedure:             Pre-Anesthesia Assessment:                        - Prior to the procedure, a History and Physical was                         performed, and patient medications and allergies were                         reviewed. The patient is competent. The risks and                         benefits of the procedure and the sedation options and                         risks were discussed with the patient. All questions                         were answered and informed consent was obtained.                         Patient identification and proposed procedure were                         verified by the physician, the nurse, the anesthetist                         and the technician in the endoscopy suite. Mental                         Status Examination: alert and oriented. Airway                         Examination: normal oropharyngeal airway and neck                         mobility. Respiratory Examination: clear to                         auscultation. CV Examination: RRR, no murmurs, no S3  or S4. Prophylactic Antibiotics: The patient does not                         require prophylactic antibiotics. Prior                         Anticoagulants: The patient has taken no anticoagulant                          or antiplatelet agents. ASA Grade Assessment: III - A                         patient with severe systemic disease. After reviewing                         the risks and benefits, the patient was deemed in                         satisfactory condition to undergo the procedure. The                         anesthesia plan was to use monitored anesthesia care                         (MAC). Immediately prior to administration of                         medications, the patient was re-assessed for adequacy                         to receive sedatives. The heart rate, respiratory                         rate, oxygen saturations, blood pressure, adequacy of                         pulmonary ventilation, and response to care were                         monitored throughout the procedure. The physical                         status of the patient was re-assessed after the                         procedure.                        After obtaining informed consent, the endoscope was                         passed under direct vision. Throughout the procedure,                         the patient's blood pressure, pulse, and oxygen                         saturations were monitored continuously. The Endoscope  was introduced through the mouth, and advanced to the                         third part of duodenum. The upper GI endoscopy was                         accomplished without difficulty. The patient tolerated                         the procedure well. Findings:      The duodenal bulb, first portion of the duodenum, second portion of the       duodenum and third portion of the duodenum were normal. Estimated blood       loss: none.      Localized moderate inflammation characterized by erosions and erythema       was found in the gastric antrum. Biopsies were taken with a cold forceps       for Helicobacter pylori testing. Estimated blood loss was  minimal.      The exam of the stomach was otherwise normal.      The Z-line was variable and was found <1 cm in length. Estimated blood       loss: none.      Esophagogastric landmarks were identified: the gastroesophageal junction       was found at 41 cm from the incisors.      No signs of esophageal or gastric varices. No GAVE or portal htn       gastropathy, Estimated blood loss: none.      The exam of the esophagus was otherwise normal. Impression:            - Normal duodenal bulb, first portion of the duodenum,                         second portion of the duodenum and third portion of                         the duodenum.                        - Gastritis. Biopsied.                        - Z-line variable, <1 cm in length.                        - Esophagogastric landmarks identified. Recommendation:        - Patient has a contact number available for                         emergencies. The signs and symptoms of potential                         delayed complications were discussed with the patient.                         Return to normal activities tomorrow. Written                         discharge instructions were provided to the patient.                        -  Discharge patient to home.                        - Resume previous diet.                        - Continue present medications.                        - Await pathology results.                        - Repeat egd if decompensating features occur, if plt                         count <150K and kpa >20                        - Return to GI clinic as previously scheduled.                        - The findings and recommendations were discussed with                         the patient. Procedure Code(s):     --- Professional ---                        [phone], Esophagogastroduodenoscopy, flexible,                         transoral; with biopsy, single or multiple Diagnosis Code(s):     --- Professional ---                         K29.70, Gastritis, unspecified, without bleeding                        K22.89, Other specified disease of esophagus                        K74.60, Unspecified cirrhosis of liver CPT copyright 2022 American Medical Association. All rights reserved. The codes documented in this report are preliminary and upon coder review may  be revised to meet current compliance requirements. Attending Participation:      I personally performed the entire procedure. Elfredia Nevins, DO Jaynie Collins DO, DO 11/20/2023 11:22:21 AM This report has been signed electronically. Number of Addenda: 0 Note Initiated On: 11/20/2023 11:10 AM Estimated Blood Loss:  Estimated blood loss was minimal.      [facility]

## 2023-11-23 ENCOUNTER — Other Ambulatory Visit: Payer: Self-pay | Admitting: Oncology

## 2023-11-23 ENCOUNTER — Encounter: Payer: Self-pay | Admitting: Gastroenterology

## 2023-11-23 LAB — SURGICAL PATHOLOGY

## 2023-11-24 ENCOUNTER — Ambulatory Visit: Admitting: Nurse Practitioner

## 2023-11-24 ENCOUNTER — Telehealth (INDEPENDENT_AMBULATORY_CARE_PROVIDER_SITE_OTHER): Admitting: Nurse Practitioner

## 2023-11-24 ENCOUNTER — Encounter: Payer: Self-pay | Admitting: Nurse Practitioner

## 2023-11-24 VITALS — Ht 65.5 in

## 2023-11-24 DIAGNOSIS — E669 Obesity, unspecified: Secondary | ICD-10-CM | POA: Diagnosis not present

## 2023-11-24 DIAGNOSIS — Z6831 Body mass index (BMI) 31.0-31.9, adult: Secondary | ICD-10-CM | POA: Diagnosis not present

## 2023-11-24 MED ORDER — ZEPBOUND 7.5 MG/0.5ML ~~LOC~~ SOAJ: 7.5000 mg | SUBCUTANEOUS | 1 refills | Status: DC

## 2023-11-24 NOTE — Progress Notes (Signed)
 MyChart Video Visit    Virtual Visit via Video Note   This visit type was conducted because this format is felt to be most appropriate for this patient at this time. Physical exam was limited by quality of the video and audio technology used for the visit. CMA was able to get the patient set up on a video visit.  Patient location: Home. Patient and provider in visit Provider location: Office  I discussed the limitations of evaluation and management by telemedicine and the availability of in person appointments. The patient expressed understanding and agreed to proceed.  Visit Date: 11/24/2023  Today's healthcare provider: [name], NP     Subjective:    Patient ID: Morgan Sanders, female    DOB: March 17, 1976, 48 y.o.   MRN: [id number]  No chief complaint on file.   HPI  Discussed the use of AI scribe software for clinical note transcription with the patient, who gave verbal consent to proceed.  History of Present Illness   Morgan Sanders "Morgan Sanders" is a 48 year old female who presents for a six-week medication follow-up.  She has been on Zepbound, starting with a dose of 2.5 mg for four weeks, followed by 5 mg for another four weeks. No side effects from Zepbound have been noted.  She has experienced a weight reduction from 205 pounds in January to 194 pounds currently, noting an 11-pound loss over three months. She attributes this to her medication, exercise, and dietary changes.  She engages in regular physical activity, including daily yoga and playing golf three times a week. Her diet includes fruits, protein bars, protein shakes, and smoothies to ensure adequate intake of vitamins and nutrients. She is satisfied with the availability of seasonal fruits.  She notes an improvement in her gastrointestinal symptoms. Previously, she experienced loose stools due to the absence of a gallbladder and other stomach issues. Now, she has regular bowel movements every morning,  which she hasn't experienced since she was [age] years old.      Past Medical History:  Diagnosis Date   Allergy    Anemia    Anxiety    Asthma    Blood transfusion without reported diagnosis    Breast cancer (HCC)    Cataract    Chicken pox    Colon polyps    Depression    Heart murmur    Hypertension    Neuromuscular disorder (HCC)    neuropathy   Personal history of radiation therapy    Pneumonia    Stomach ulcer     Past Surgical History:  Procedure Laterality Date   BREAST BIOPSY Right 01/01/2021   u/s bx 12:00 1 cmfn positive x clip lateral   BREAST BIOPSY Right 01/01/2021   u/s bx 12:00 1cmfn positive vision medial   BREAST LUMPECTOMY     BREAST LUMPECTOMY WITH SENTINEL LYMPH NODE BIOPSY Right 01/18/2021   Procedure: BREAST LUMPECTOMY WITH SENTINEL LYMPH NODE BX;  Surgeon: Earline Mayotte, MD;  Location: [facility] ORS;  Service: General;  Laterality: Right;   CHOLECYSTECTOMY     CHOLECYSTECTOMY, LAPAROSCOPIC  03/1996   COLONOSCOPY     COLONOSCOPY N/A 11/20/2023   Procedure: COLONOSCOPY;  Surgeon: Jaynie Collins, DO;  Location: [facility] ENDOSCOPY;  Service: Gastroenterology;  Laterality: N/A;   ESOPHAGOGASTRODUODENOSCOPY N/A 11/20/2023   Procedure: EGD (ESOPHAGOGASTRODUODENOSCOPY);  Surgeon: Jaynie Collins, DO;  Location: [facility] ENDOSCOPY;  Service: Gastroenterology;  Laterality: N/A;   ESOPHAGOGASTRODUODENOSCOPY (EGD) WITH PROPOFOL N/A 12/31/2018  Procedure: ESOPHAGOGASTRODUODENOSCOPY (EGD) WITH PROPOFOL;  Surgeon: Midge Minium, MD;  Location: [facility] ENDOSCOPY;  Service: Endoscopy;  Laterality: N/A;   TUBAL LIGATION     WISDOM TOOTH EXTRACTION      Family History  Problem Relation Age of Onset   Hypertension Mother    Diabetes Mother    Hyperlipidemia Mother    Kidney disease Mother    Arthritis Mother    Asthma Mother    Depression Mother    Sudden death Father        hit by train   Liver disease Father        hx of liver transplant   Early death  Father    Hyperlipidemia Sister    Hypertension Sister    Breast cancer Maternal Grandmother 60   Arthritis Maternal Grandmother    Cancer Maternal Grandmother    Diabetes Maternal Grandmother    Hearing loss Maternal Grandmother    Hypertension Maternal Grandmother    Early death Maternal Grandfather    Heart attack Paternal Grandmother        passed from heart attach   Cataracts Paternal Grandfather    Lung cancer Paternal Grandfather    Depression Daughter    Depression Daughter    Bone cancer Maternal Great-grandmother     Social History   Socioeconomic History   Marital status: Married    Spouse name: Harvie Heck   Number of children: 2   Years of education: high school   Highest education level: GED or equivalent  Occupational History   Occupation: Golf/clubhouse attendent  Tobacco Use   Smoking status: Former    Current packs/day: 0.00    Types: Cigarettes    Quit date: 08/18/1998    Years since quitting: 25.2   Smokeless tobacco: Never   Tobacco comments:    Former cigarette smoker  Advertising account planner   Vaping status: Some Days   Substances: Nicotine, Flavoring  Substance and Sexual Activity   Alcohol use: Not Currently    Comment: a few times a year   Drug use: Never   Sexual activity: Yes    Birth control/protection: Post-menopausal, Other-see comments  Other Topics Concern   Not on file  Social History Narrative   02/07/20   From: the area   Living: with husband, Harvie Heck (2000)   Work: at a golf course      Family: daughters - CJ (lives at [place], 2 children) and Geologist, engineering (still living at home, in Social worker school)      Enjoys: golf       Exercise: golfing 3-4 times a week - alternating between cart/walking   Diet: pretty good - eggs/fruit, eats lunch and dinner      Safety   Seat belts: Yes    Guns: Yes  and secure   Safe in relationships: Yes    Social Drivers of Corporate investment banker Strain: Low Risk  (10/06/2023)   Overall Financial Resource Strain  (CARDIA)    Difficulty of Paying Living Expenses: Not very hard  Food Insecurity: No Food Insecurity (10/06/2023)   Hunger Vital Sign    Worried About Running Out of Food in the Last Year: Never true    Ran Out of Food in the Last Year: Never true  Transportation Needs: No Transportation Needs (10/06/2023)   PRAPARE - Administrator, Civil Service (Medical): No    Lack of Transportation (Non-Medical): No  Physical Activity: Insufficiently Active (10/06/2023)   Exercise Vital Sign  Days of Exercise per Week: 5 days    Minutes of Exercise per Session: 20 min  Stress: No Stress Concern Present (10/06/2023)   [employer] of Occupational Health - Occupational Stress Questionnaire    Feeling of Stress : Not at all  Social Connections: Unknown (10/06/2023)   Social Connection and Isolation Panel [NHANES]    Frequency of Communication with Friends and Family: More than three times a week    Frequency of Social Gatherings with Friends and Family: Once a week    Attends Religious Services: Patient declined    Database administrator or Organizations: No    Attends Banker Meetings: Patient declined    Marital Status: Married  Catering manager Violence: Unknown (01/21/2019)   [facility], [facility]   Humiliation, Afraid, Rape, and Kick questionnaire    Fear of Current or Ex-Partner: Patient declined    Emotionally Abused: Patient declined    Physically Abused: Patient declined    Sexually Abused: Patient declined    Outpatient Medications Prior to Visit  Medication Sig Dispense Refill   albuterol (VENTOLIN HFA) 108 (90 Base) MCG/ACT inhaler INHALE 2 PUFFS INTO THE LUNGS EVERY 4 (FOUR) HOURS AS NEEDED FOR SHORTNESS OF BREATH OR WHEEZING 8.5 each 1   ALPRAZolam (XANAX) 0.5 MG tablet TAKE 1 TABLET BY MOUTH EVERY DAY AS NEEDED FOR ANXIETY 30 tablet 5   budesonide-formoterol (SYMBICORT) 80-4.5 MCG/ACT inhaler Inhale 2 puffs into the lungs 2 (two)  times daily. Rinse mouth out after each use 1 each 12   Cholecalciferol (VITAMIN D3) 125 MCG (5000 UT) CAPS Take 5,000 Units by mouth daily.     escitalopram (LEXAPRO) 20 MG tablet Take 1 tablet (20 mg total) by mouth daily. 90 tablet 3   famotidine (PEPCID) 20 MG tablet Take 20 mg by mouth 2 (two) times daily.     fenofibrate (TRICOR) 145 MG tablet Take 1 tablet (145 mg total) by mouth daily. 90 tablet 3   letrozole (FEMARA) 2.5 MG tablet TAKE 1 TABLET BY MOUTH EVERY DAY 90 tablet 2   montelukast (SINGULAIR) 10 MG tablet TAKE 1 TABLET BY MOUTH EVERYDAY AT BEDTIME 90 tablet 3   Potassium Chloride ER 20 MEQ TBCR TAKE 1 TABLET BY MOUTH EVERY DAY 90 tablet 2   pregabalin (LYRICA) 200 MG capsule Take 200 mg by mouth 2 (two) times daily.     rosuvastatin (CRESTOR) 10 MG tablet Take 1 tablet (10 mg total) by mouth daily. 90 tablet 3   tirzepatide (ZEPBOUND) 5 MG/0.5ML Pen Inject 5 mg into the skin once a week. 2 mL 0   tirzepatide (ZEPBOUND) 2.5 MG/0.5ML Pen Inject 2.5 mg into the skin once a week. 2 mL 0   No facility-administered medications prior to visit.    No Known Allergies  ROS See HPI    Objective:    Physical Exam  Ht 5' 5.5" (1.664 m)   BMI 31.79 kg/m  Wt Readings from Last 3 Encounters:  11/20/23 194 lb (88 kg)  10/06/23 202 lb (91.6 kg)  09/18/23 205 lb (93 kg)   GENERAL: alert, oriented, appears well and in no acute distress   HEENT: atraumatic, conjunttiva clear, no obvious abnormalities on inspection of external nose and ears   NECK: normal movements of the head and neck   LUNGS: on inspection no signs of respiratory distress, breathing rate appears normal, no obvious gross SOB, gasping or wheezing   CV: no obvious cyanosis  MS: moves all visible extremities without noticeable abnormality   PSYCH/NEURO: pleasant and cooperative, no obvious depression or anxiety, speech and thought processing grossly intact    Assessment & Plan:   Problem List Items  Addressed This Visit     Obesity (BMI 30-39.9) - Primary   Weight loss of 11 lbs over three months with Zepbound and lifestyle changes. No medication side effects. Improved gastrointestinal symptoms likely due to diet and exercise. Increase Zepbound to 7.5 mg weekly. Continue regular exercise, including yoga and golf. Maintain a healthy diet with adequate protein and fruits. Schedule follow-up in six weeks to monitor progress.       Relevant Medications   tirzepatide (ZEPBOUND) 7.5 MG/0.5ML Pen    I have discontinued Jolea L. Englert "Mandy"'s Zepbound and Zepbound. I am also having her start on Zepbound. Additionally, I am having her maintain her famotidine, Vitamin D3, pregabalin, budesonide-formoterol, escitalopram, montelukast, fenofibrate, ALPRAZolam, rosuvastatin, Potassium Chloride ER, albuterol, and letrozole.  Meds ordered this encounter  Medications   tirzepatide (ZEPBOUND) 7.5 MG/0.5ML Pen    Sig: Inject 7.5 mg into the skin once a week.    Dispense:  2 mL    Refill:  1    Supervising Provider:   Sherlene Shams [[id number]]    I discussed the assessment and treatment plan with the patient. The patient was provided an opportunity to ask questions and all were answered. The patient agreed with the plan and demonstrated an understanding of the instructions.   The patient was advised to call back or seek an in-person evaluation if the symptoms worsen or if the condition fails to improve as anticipated.   [name], NP [facility] [phone] (phone) [phone] (fax)  [facility]

## 2023-11-24 NOTE — Assessment & Plan Note (Signed)
 Weight loss of 11 lbs over three months with Zepbound and lifestyle changes. No medication side effects. Improved gastrointestinal symptoms likely due to diet and exercise. Increase Zepbound to 7.5 mg weekly. Continue regular exercise, including yoga and golf. Maintain a healthy diet with adequate protein and fruits. Schedule follow-up in six weeks to monitor progress.

## 2023-12-11 ENCOUNTER — Ambulatory Visit: Admitting: Nurse Practitioner

## 2023-12-20 ENCOUNTER — Other Ambulatory Visit: Payer: Self-pay | Admitting: Nurse Practitioner

## 2023-12-20 DIAGNOSIS — J4540 Moderate persistent asthma, uncomplicated: Secondary | ICD-10-CM

## 2023-12-20 DIAGNOSIS — F331 Major depressive disorder, recurrent, moderate: Secondary | ICD-10-CM

## 2023-12-29 ENCOUNTER — Ambulatory Visit
Admission: RE | Admit: 2023-12-29 | Discharge: 2023-12-29 | Disposition: A | Discharge status: Home / Self Care | Payer: BC Managed Care – PPO | Source: Ambulatory Visit | Attending: Oncology | Admitting: Oncology

## 2023-12-29 ENCOUNTER — Other Ambulatory Visit: Payer: Self-pay | Admitting: Nurse Practitioner

## 2023-12-29 ENCOUNTER — Telehealth: Payer: Self-pay | Admitting: Nurse Practitioner

## 2023-12-29 DIAGNOSIS — E782 Mixed hyperlipidemia: Secondary | ICD-10-CM

## 2023-12-29 DIAGNOSIS — E559 Vitamin D deficiency, unspecified: Secondary | ICD-10-CM

## 2023-12-29 DIAGNOSIS — Z08 Encounter for follow-up examination after completed treatment for malignant neoplasm: Secondary | ICD-10-CM | POA: Diagnosis not present

## 2023-12-29 DIAGNOSIS — R92323 Mammographic fibroglandular density, bilateral breasts: Secondary | ICD-10-CM | POA: Diagnosis not present

## 2023-12-29 DIAGNOSIS — Z853 Personal history of malignant neoplasm of breast: Secondary | ICD-10-CM | POA: Diagnosis not present

## 2023-12-29 DIAGNOSIS — C50919 Malignant neoplasm of unspecified site of unspecified female breast: Secondary | ICD-10-CM | POA: Diagnosis not present

## 2023-12-29 DIAGNOSIS — E669 Obesity, unspecified: Secondary | ICD-10-CM

## 2023-12-29 DIAGNOSIS — Z170 Estrogen receptor positive status [ER+]: Secondary | ICD-10-CM | POA: Insufficient documentation

## 2023-12-29 DIAGNOSIS — C50911 Malignant neoplasm of unspecified site of right female breast: Secondary | ICD-10-CM | POA: Diagnosis not present

## 2023-12-29 DIAGNOSIS — E538 Deficiency of other specified B group vitamins: Secondary | ICD-10-CM

## 2023-12-29 DIAGNOSIS — D649 Anemia, unspecified: Secondary | ICD-10-CM

## 2023-12-29 DIAGNOSIS — Z1329 Encounter for screening for other suspected endocrine disorder: Secondary | ICD-10-CM

## 2023-12-29 NOTE — Telephone Encounter (Signed)
 Patient need lab orders.

## 2024-01-04 DIAGNOSIS — M1711 Unilateral primary osteoarthritis, right knee: Secondary | ICD-10-CM | POA: Diagnosis not present

## 2024-01-04 DIAGNOSIS — M25461 Effusion, right knee: Secondary | ICD-10-CM | POA: Diagnosis not present

## 2024-01-07 ENCOUNTER — Telehealth: Payer: Self-pay | Admitting: Nurse Practitioner

## 2024-01-07 NOTE — Telephone Encounter (Signed)
 Prescription Request  01/07/2024  LOV: 07/14/2023  What is the name of the medication or equipment?  tirzepatide  (ZEPBOUND ) 7.5 MG/0.5ML Pen Patient requesting increase in medication doseage.   Have you contacted your pharmacy to request a refill? No   Which pharmacy would you like this sent to?  [id number] [place], [place] - [address] AT [facility] [address] [place] [address] Phone: [phone] Fax: [phone]    Patient notified that their request is being sent to the clinical staff for review and that they should receive a response within 2 business days.   Please advise at Mobile [phone] (mobile)

## 2024-01-08 ENCOUNTER — Other Ambulatory Visit: Payer: BC Managed Care – PPO

## 2024-01-12 ENCOUNTER — Encounter: Payer: BC Managed Care – PPO | Admitting: Nurse Practitioner

## 2024-01-13 ENCOUNTER — Other Ambulatory Visit: Payer: Self-pay | Admitting: Nurse Practitioner

## 2024-01-13 DIAGNOSIS — E669 Obesity, unspecified: Secondary | ICD-10-CM

## 2024-01-17 ENCOUNTER — Other Ambulatory Visit: Payer: Self-pay | Admitting: Nurse Practitioner

## 2024-01-17 DIAGNOSIS — E781 Pure hyperglyceridemia: Secondary | ICD-10-CM

## 2024-01-19 ENCOUNTER — Other Ambulatory Visit: Payer: Self-pay | Admitting: Nurse Practitioner

## 2024-01-19 DIAGNOSIS — F411 Generalized anxiety disorder: Secondary | ICD-10-CM

## 2024-01-21 NOTE — Telephone Encounter (Signed)
 Name of Medication: Alprazolam  (Xanax ) 0.5mg  Name of Pharmacy: [id number] - [place], [place] - [address] AT [facility]   Last Calypso or Written Date and Quantity: 30tab 5 refills ordered Last Office Visit and Type: 07/14/23 follow up on HTN Next Office Visit and Type: 02/22/24 follow up and lab work Last Controlled Substance Agreement Date: none Last UDS: none

## 2024-02-16 ENCOUNTER — Other Ambulatory Visit (INDEPENDENT_AMBULATORY_CARE_PROVIDER_SITE_OTHER)

## 2024-02-16 DIAGNOSIS — E669 Obesity, unspecified: Secondary | ICD-10-CM

## 2024-02-16 DIAGNOSIS — E559 Vitamin D deficiency, unspecified: Secondary | ICD-10-CM

## 2024-02-16 DIAGNOSIS — E538 Deficiency of other specified B group vitamins: Secondary | ICD-10-CM

## 2024-02-16 DIAGNOSIS — E782 Mixed hyperlipidemia: Secondary | ICD-10-CM | POA: Diagnosis not present

## 2024-02-16 DIAGNOSIS — Z1329 Encounter for screening for other suspected endocrine disorder: Secondary | ICD-10-CM

## 2024-02-16 DIAGNOSIS — D649 Anemia, unspecified: Secondary | ICD-10-CM | POA: Diagnosis not present

## 2024-02-16 LAB — CBC WITH DIFFERENTIAL/PLATELET
Basophils Absolute: 0 10*3/uL (ref 0.0–0.1)
Basophils Relative: 0.5 % (ref 0.0–3.0)
Eosinophils Absolute: 0.1 10*3/uL (ref 0.0–0.7)
Eosinophils Relative: 1.2 % (ref 0.0–5.0)
HCT: 41.2 % (ref 36.0–46.0)
Hemoglobin: 13.6 g/dL (ref 12.0–15.0)
Lymphocytes Relative: 26.5 % (ref 12.0–46.0)
Lymphs Abs: 1.6 10*3/uL (ref 0.7–4.0)
MCHC: 33 g/dL (ref 30.0–36.0)
MCV: 95 fl (ref 78.0–100.0)
Monocytes Absolute: 0.4 10*3/uL (ref 0.1–1.0)
Monocytes Relative: 6.7 % (ref 3.0–12.0)
Neutro Abs: 3.8 10*3/uL (ref 1.4–7.7)
Neutrophils Relative %: 65.1 % (ref 43.0–77.0)
Platelets: 164 10*3/uL (ref 150.0–400.0)
RBC: 4.33 Mil/uL (ref 3.87–5.11)
RDW: 14.3 % (ref 11.5–15.5)
WBC: 5.8 10*3/uL (ref 4.0–10.5)

## 2024-02-16 LAB — LIPID PANEL
Cholesterol: 252 mg/dL — ABNORMAL HIGH (ref 0–200)
HDL: 41.6 mg/dL (ref >39.00)
LDL Cholesterol: 170 mg/dL — ABNORMAL HIGH (ref 0–99)
NonHDL: 210.51
Total CHOL/HDL Ratio: 6
Triglycerides: 204 mg/dL — ABNORMAL HIGH (ref 0.0–149.0)
VLDL: 40.8 mg/dL — ABNORMAL HIGH (ref 0.0–40.0)

## 2024-02-16 LAB — COMPREHENSIVE METABOLIC PANEL WITH GFR
ALT: 18 U/L (ref 0–35)
AST: 19 U/L (ref 0–37)
Albumin: 4.9 g/dL (ref 3.5–5.2)
Alkaline Phosphatase: 33 U/L — ABNORMAL LOW (ref 39–117)
BUN: 15 mg/dL (ref 6–23)
CO2: 27 meq/L (ref 19–32)
Calcium: 9.7 mg/dL (ref 8.4–10.5)
Chloride: 102 meq/L (ref 96–112)
Creatinine, Ser: 1.16 mg/dL (ref 0.40–1.20)
GFR: 55.34 mL/min — ABNORMAL LOW (ref >60.00)
Glucose, Bld: 114 mg/dL — ABNORMAL HIGH (ref 70–99)
Potassium: 4.8 meq/L (ref 3.5–5.1)
Sodium: 138 meq/L (ref 135–145)
Total Bilirubin: 0.5 mg/dL (ref 0.2–1.2)
Total Protein: 7.3 g/dL (ref 6.0–8.3)

## 2024-02-16 LAB — TSH: TSH: 1.07 u[IU]/mL (ref 0.35–5.50)

## 2024-02-16 LAB — HEMOGLOBIN A1C: Hgb A1c MFr Bld: 5.3 % (ref 4.6–6.5)

## 2024-02-16 LAB — VITAMIN B12: Vitamin B-12: 1292 pg/mL — ABNORMAL HIGH (ref 211–911)

## 2024-02-16 LAB — VITAMIN D 25 HYDROXY (VIT D DEFICIENCY, FRACTURES): VITD: 80.93 ng/mL (ref 30.00–100.00)

## 2024-02-22 ENCOUNTER — Ambulatory Visit: Admitting: Nurse Practitioner

## 2024-02-22 ENCOUNTER — Ambulatory Visit: Payer: Self-pay | Admitting: Nurse Practitioner

## 2024-02-22 ENCOUNTER — Other Ambulatory Visit (HOSPITAL_COMMUNITY)
Admission: RE | Admit: 2024-02-22 | Discharge: 2024-02-22 | Disposition: A | Discharge status: Home / Self Care | Source: Ambulatory Visit | Attending: Nurse Practitioner | Admitting: Nurse Practitioner

## 2024-02-22 VITALS — BP 124/78 | HR 67 | Temp 98.3°F | Ht 65.5 in | Wt 188.2 lb

## 2024-02-22 DIAGNOSIS — J4540 Moderate persistent asthma, uncomplicated: Secondary | ICD-10-CM

## 2024-02-22 DIAGNOSIS — E538 Deficiency of other specified B group vitamins: Secondary | ICD-10-CM | POA: Diagnosis not present

## 2024-02-22 DIAGNOSIS — Z780 Asymptomatic menopausal state: Secondary | ICD-10-CM | POA: Insufficient documentation

## 2024-02-22 DIAGNOSIS — Z0000 Encounter for general adult medical examination without abnormal findings: Secondary | ICD-10-CM

## 2024-02-22 DIAGNOSIS — G629 Polyneuropathy, unspecified: Secondary | ICD-10-CM

## 2024-02-22 DIAGNOSIS — Z1151 Encounter for screening for human papillomavirus (HPV): Secondary | ICD-10-CM | POA: Diagnosis not present

## 2024-02-22 DIAGNOSIS — Z01419 Encounter for gynecological examination (general) (routine) without abnormal findings: Secondary | ICD-10-CM

## 2024-02-22 DIAGNOSIS — Z113 Encounter for screening for infections with a predominantly sexual mode of transmission: Secondary | ICD-10-CM | POA: Diagnosis not present

## 2024-02-22 DIAGNOSIS — Z124 Encounter for screening for malignant neoplasm of cervix: Secondary | ICD-10-CM | POA: Insufficient documentation

## 2024-02-22 DIAGNOSIS — F411 Generalized anxiety disorder: Secondary | ICD-10-CM

## 2024-02-22 DIAGNOSIS — E782 Mixed hyperlipidemia: Secondary | ICD-10-CM | POA: Diagnosis not present

## 2024-02-22 DIAGNOSIS — R944 Abnormal results of kidney function studies: Secondary | ICD-10-CM

## 2024-02-22 DIAGNOSIS — E669 Obesity, unspecified: Secondary | ICD-10-CM

## 2024-02-22 MED ORDER — ESCITALOPRAM OXALATE 20 MG PO TABS: 20.0000 mg | ORAL_TABLET | Freq: Every day | ORAL | 3 refills | Status: AC

## 2024-02-22 MED ORDER — ZEPBOUND 10 MG/0.5ML ~~LOC~~ SOAJ
10.0000 mg | SUBCUTANEOUS | 1 refills | Status: DC
Start: 2024-02-22 — End: 2024-05-30

## 2024-02-22 MED ORDER — FENOFIBRATE 145 MG PO TABS
145.0000 mg | ORAL_TABLET | Freq: Every day | ORAL | 3 refills | Status: AC
Start: 2024-02-22 — End: ?

## 2024-02-22 MED ORDER — POTASSIUM CHLORIDE ER 20 MEQ PO TBCR: 1.0000 | EXTENDED_RELEASE_TABLET | Freq: Every day | ORAL | 3 refills | Status: DC

## 2024-02-22 MED ORDER — BUDESONIDE-FORMOTEROL FUMARATE 80-4.5 MCG/ACT IN AERO: 2.0000 | INHALATION_SPRAY | Freq: Two times a day (BID) | RESPIRATORY_TRACT | 12 refills | Status: AC

## 2024-02-22 MED ORDER — ALPRAZOLAM 0.5 MG PO TABS: ORAL_TABLET | ORAL | 5 refills | Status: DC

## 2024-02-22 NOTE — Progress Notes (Signed)
 Leron Glance, NP-C Phone: [phone]  Morgan Sanders is a 48 y.o. female who presents today for annual exam.   Discussed the use of AI scribe software for clinical note transcription with the patient, who gave verbal consent to proceed.  History of Present Illness   Morgan Sanders is a 48 year old female who presents for an annual physical exam and repeat Pap smear. Lab work was completed prior to appointment.   Last year, her Pap smear showed ASCUS, but her HPV test was negative. She is up to date on her mammogram and colonoscopy, having completed them in May and April respectively.  She has a history of breast cancer treated with letrozole , which induced menopause. Her B12 levels were elevated due to high-dose supplementation, which she has since discontinued. Her cholesterol levels have improved slightly with lifestyle changes, including diet and exercise, but remain elevated. She takes fenofibrate  daily for triglycerides but has not been taking Crestor  due to concerns about energy levels.  She engages in regular physical activity, including yoga three times a week, golfing once a week, and daily walking when weather permits. Her diet is low in carbohydrates, focusing on lean meats and fresh vegetables. She does not smoke or consume alcohol, but she vapes. She has dentures and has not seen an eye doctor in two years.  Her kidney function, as indicated by GFR, was slightly decreased at 55, down from the 60s previously. No chest pain, shortness of breath, abdominal pain, urinary symptoms, headaches, dizziness, skin changes, or mood disturbances. She reports a past issue with knee swelling, which was treated with a cortisone shot and a brace, and is now resolved.      Social History   Tobacco Use  Smoking Status Former   Current packs/day: 0.00   Types: Cigarettes   Quit date: 08/18/1998   Years since quitting: 25.5  Smokeless Tobacco Never  Tobacco Comments   Former cigarette  smoker    Current Outpatient Medications on File Prior to Visit  Medication Sig Dispense Refill   albuterol  (VENTOLIN  HFA) 108 (90 Base) MCG/ACT inhaler INHALE 2 PUFFS INTO THE LUNGS EVERY 4 (FOUR) HOURS AS NEEDED FOR SHORTNESS OF BREATH OR WHEEZING 8.5 each 1   Cholecalciferol (VITAMIN D3) 125 MCG (5000 UT) CAPS Take 5,000 Units by mouth daily.     famotidine  (PEPCID ) 20 MG tablet Take 20 mg by mouth 2 (two) times daily.     letrozole  (FEMARA ) 2.5 MG tablet TAKE 1 TABLET BY MOUTH EVERY DAY 90 tablet 2   montelukast  (SINGULAIR ) 10 MG tablet TAKE 1 TABLET BY MOUTH EVERYDAY AT BEDTIME 90 tablet 3   pregabalin  (LYRICA ) 200 MG capsule Take 200 mg by mouth 2 (two) times daily.     rosuvastatin  (CRESTOR ) 10 MG tablet Take 1 tablet (10 mg total) by mouth daily. 90 tablet 3   No current facility-administered medications on file prior to visit.     ROS see history of present illness  Objective  Physical Exam Vitals:   02/22/24 0903  BP: 124/78  Pulse: 67  Temp: 98.3 F (36.8 C)  SpO2: 98%    BP Readings from Last 3 Encounters:  02/22/24 124/78  11/20/23 (!) 144/94  09/18/23 138/75   Wt Readings from Last 3 Encounters:  02/22/24 188 lb 3.2 oz (85.4 kg)  11/20/23 194 lb (88 kg)  10/06/23 202 lb (91.6 kg)    Physical Exam Exam conducted with a chaperone present Claryce Ellen, CMA).  Constitutional:  General: She is not in acute distress.    Appearance: Normal appearance.  HENT:     Head: Normocephalic.     Right Ear: Tympanic membrane normal.     Left Ear: Tympanic membrane normal.     Nose: Nose normal.     Mouth/Throat:     Mouth: Mucous membranes are moist.     Pharynx: Oropharynx is clear.  Eyes:     Conjunctiva/sclera: Conjunctivae normal.     Pupils: Pupils are equal, round, and reactive to light.  Neck:     Thyroid : No thyromegaly.  Cardiovascular:     Rate and Rhythm: Normal rate and regular rhythm.     Heart sounds: Normal heart sounds.  Pulmonary:      Effort: Pulmonary effort is normal.     Breath sounds: Normal breath sounds.  Abdominal:     General: Abdomen is flat. Bowel sounds are normal.     Palpations: Abdomen is soft. There is no mass.     Tenderness: There is no abdominal tenderness.  Genitourinary:    General: Normal vulva.     Pubic Area: No rash.      Labia:        Right: No lesion.        Left: No lesion.      Vagina: Normal. No vaginal discharge, tenderness or bleeding.     Cervix: Normal. No discharge, lesion or erythema.     Uterus: Normal.   Musculoskeletal:        General: Normal range of motion.  Lymphadenopathy:     Cervical: No cervical adenopathy.  Skin:    General: Skin is warm and dry.     Findings: No rash.  Neurological:     General: No focal deficit present.     Mental Status: She is alert.  Psychiatric:        Mood and Affect: Mood normal.        Behavior: Behavior normal.      Assessment/Plan: Please see individual problem list.  Well woman exam with routine gynecological exam Assessment & Plan: Physical exam complete. Lab results reviewed with patient. A Pap smear is due for repeat today due to a previous ASCUS result with negative HPV. We will contact patient with results. Mammogram and colonoscopy are up to date. Flu and tetanus vaccines are up to date. She received 2 COVID vaccines and declines additional. Continue regular eye exams. Encourage healthy diet and regular exercise. Return to care in 6 weeks for labs, then follow up in 6 months, sooner as needed.    Screening for cervical cancer -     Cytology - PAP  Mixed hyperlipidemia Assessment & Plan: Chronic hyperlipidemia shows improved cholesterol levels with LDL reduced to 170. The 10-year ASCVD risk score (Arnett DK, et al., 2019) is: 2.1%. Crestor  was initiated on a Monday, Wednesday, Friday schedule after shared decision-making. A fasting lipid panel will be ordered in six weeks. Encourage healthy diet and regular exercise.    Orders: -     Fenofibrate ; Take 1 tablet (145 mg total) by mouth daily.  Dispense: 90 tablet; Refill: 3 -     Comprehensive metabolic panel with GFR; Future -     Lipid panel; Future  B12 deficiency Assessment & Plan: Elevated B12 levels are attributed to high-dose supplementation, which has been discontinued. B12 levels will be rechecked in six weeks.  Orders: -     Vitamin B12; Future  Decreased GFR Assessment & Plan: GFR  has decreased to 55, likely due to dehydration. Emphasized the importance of hydration, especially during exercise and heat exposure. GFR will be rechecked in six weeks with labs.   Generalized anxiety disorder Assessment & Plan: Well controlled on Xanax  0.5 mg daily PRN and Lexapro  20 mg daily. Continue. PDMP reviewed.   Orders: -     Escitalopram  Oxalate; Take 1 tablet (20 mg total) by mouth daily.  Dispense: 90 tablet; Refill: 3 -     ALPRAZolam ; TAKE 1 TABLET BY MOUTH EVERY DAY AS NEEDED FOR ANXIETY  Dispense: 30 tablet; Refill: 5  Moderate persistent asthma without complication -     Budesonide -Formoterol  Fumarate; Inhale 2 puffs into the lungs 2 (two) times daily. Rinse mouth out after each use  Dispense: 1 each; Refill: 12  Polyneuropathy -     Potassium Chloride  ER; Take 1 tablet (20 mEq total) by mouth daily.  Dispense: 90 tablet; Refill: 3  Obesity (BMI 30-39.9) -     Zepbound ; Inject 10 mg into the skin once a week.  Dispense: 6 mL; Refill: 1     Return for fasting labs in 6 weeks then 6 month follow up.   Leron Glance, NP-C [facility]

## 2024-02-25 ENCOUNTER — Ambulatory Visit: Payer: Self-pay | Admitting: Nurse Practitioner

## 2024-02-25 LAB — CYTOLOGY - PAP
Chlamydia: NEGATIVE
Comment: NEGATIVE
Comment: NEGATIVE
Comment: NEGATIVE
Comment: NORMAL
Diagnosis: NEGATIVE
High risk HPV: NEGATIVE
Neisseria Gonorrhea: NEGATIVE
Trichomonas: NEGATIVE

## 2024-02-29 ENCOUNTER — Encounter: Payer: Self-pay | Admitting: Nurse Practitioner

## 2024-02-29 DIAGNOSIS — R944 Abnormal results of kidney function studies: Secondary | ICD-10-CM | POA: Insufficient documentation

## 2024-02-29 NOTE — Assessment & Plan Note (Signed)
 Physical exam complete. Lab results reviewed with patient. A Pap smear is due for repeat today due to a previous ASCUS result with negative HPV. We will contact patient with results. Mammogram and colonoscopy are up to date. Flu and tetanus vaccines are up to date. She received 2 COVID vaccines and declines additional. Continue regular eye exams. Encourage healthy diet and regular exercise. Return to care in 6 weeks for labs, then follow up in 6 months, sooner as needed.

## 2024-02-29 NOTE — Assessment & Plan Note (Signed)
 Elevated B12 levels are attributed to high-dose supplementation, which has been discontinued. B12 levels will be rechecked in six weeks.

## 2024-02-29 NOTE — Assessment & Plan Note (Signed)
 GFR has decreased to 55, likely due to dehydration. Emphasized the importance of hydration, especially during exercise and heat exposure. GFR will be rechecked in six weeks with labs.

## 2024-02-29 NOTE — Assessment & Plan Note (Signed)
 Chronic hyperlipidemia shows improved cholesterol levels with LDL reduced to 170. The 10-year ASCVD risk score (Arnett DK, et al., 2019) is: 2.1%. Crestor  was initiated on a Monday, Wednesday, Friday schedule after shared decision-making. A fasting lipid panel will be ordered in six weeks. Encourage healthy diet and regular exercise.

## 2024-02-29 NOTE — Assessment & Plan Note (Signed)
 Well controlled on Xanax  0.5 mg daily PRN and Lexapro  20 mg daily. Continue. PDMP reviewed.

## 2024-03-18 ENCOUNTER — Encounter: Payer: Self-pay | Admitting: Nurse Practitioner

## 2024-03-18 ENCOUNTER — Inpatient Hospital Stay: Payer: BC Managed Care – PPO | Attending: Oncology | Admitting: Nurse Practitioner

## 2024-03-18 VITALS — BP 120/86 | HR 65 | Temp 98.6°F | Resp 20 | Wt 191.1 lb

## 2024-03-18 DIAGNOSIS — Z170 Estrogen receptor positive status [ER+]: Secondary | ICD-10-CM | POA: Diagnosis not present

## 2024-03-18 DIAGNOSIS — C50911 Malignant neoplasm of unspecified site of right female breast: Secondary | ICD-10-CM | POA: Insufficient documentation

## 2024-03-18 DIAGNOSIS — Z79811 Long term (current) use of aromatase inhibitors: Secondary | ICD-10-CM | POA: Insufficient documentation

## 2024-03-18 DIAGNOSIS — Z809 Family history of malignant neoplasm, unspecified: Secondary | ICD-10-CM | POA: Insufficient documentation

## 2024-03-18 DIAGNOSIS — Z1732 Human epidermal growth factor receptor 2 negative status: Secondary | ICD-10-CM | POA: Diagnosis not present

## 2024-03-18 DIAGNOSIS — Z1721 Progesterone receptor positive status: Secondary | ICD-10-CM | POA: Diagnosis not present

## 2024-03-18 DIAGNOSIS — Z79899 Other long term (current) drug therapy: Secondary | ICD-10-CM | POA: Insufficient documentation

## 2024-03-18 DIAGNOSIS — Z801 Family history of malignant neoplasm of trachea, bronchus and lung: Secondary | ICD-10-CM | POA: Insufficient documentation

## 2024-03-18 DIAGNOSIS — Z923 Personal history of irradiation: Secondary | ICD-10-CM | POA: Insufficient documentation

## 2024-03-18 DIAGNOSIS — Z803 Family history of malignant neoplasm of breast: Secondary | ICD-10-CM | POA: Diagnosis not present

## 2024-03-18 DIAGNOSIS — Z853 Personal history of malignant neoplasm of breast: Secondary | ICD-10-CM

## 2024-03-18 DIAGNOSIS — C773 Secondary and unspecified malignant neoplasm of axilla and upper limb lymph nodes: Secondary | ICD-10-CM

## 2024-03-18 DIAGNOSIS — Z808 Family history of malignant neoplasm of other organs or systems: Secondary | ICD-10-CM | POA: Insufficient documentation

## 2024-03-18 DIAGNOSIS — Z5181 Encounter for therapeutic drug level monitoring: Secondary | ICD-10-CM

## 2024-03-18 NOTE — Progress Notes (Signed)
 Hematology/Oncology Consult note [facility]  [phone] [phone]  Patient Care Team: Gretel App, NP as PCP - General (Nurse Practitioner) Darliss Rogue, MD as PCP - Cardiology (Cardiology) Samuella Twyla CHRISTELLA DEVONNA (Gastroenterology) Avanell Katz, MD as Referring Physician (Physical Medicine and Rehabilitation) Fleeta Shiver, Herlene BIRCH, MD (Neurology) Dannielle Arlean FALCON, RN (Inactive) as Oncology Nurse Navigator Melanee Annah BROCKS, MD as Consulting Physician (Oncology)   Name of the patient: Morgan Sanders  [id number]  10/26/1975   Date of visit: 03/18/24  Diagnosis-  pathological prognostic stage Ia invasive mammary carcinoma of the right breast and pT1b pN1 acM0 ER/PR positive HER2 negative   Chief complaint/ Reason for visit-routine follow-up of breast cancer on letrozole   Heme/Onc history: Patient is a 48 year old post menopausal female with a prior history of normocytic anemia for which she had a complete work-up including bone marrow biopsy as well as history of cirrhosis of the liver.  She is postmenopausal and she has not had any menstrual cycles since the age of 48.  Patient underwent a bilateral screening mammogram on 12/21/2020 which showed possible mass in the right breast.  This was followed by diagnostic mammogram and ultrasound which showed 2 masses in the right breast at 12 o'clock position each measuring 6 mm and 1.2 cm apart.  Ultrasound of the right axilla was negative for malignancy.  Patient underwent biopsy of both the medial and the lateral breast mass and it was consistent with invasive mammary carcinoma grade 2.  ER greater than 90% positive, PR greater than 90% positive and HER2 negative   Patient underwent right lumpectomy with sentinel lymph node biopsy Final pathology showed 2 tumors 7 mm in size both of which were grade 1.  The tissue in between did not have any evidence of malignancy or DCIS.  She was noted to have  metastatic carcinoma in 2 out of 4 sentinel lymph node 6 mm deposit with no extracapsular extension.   MammaPrint score came back at low risk and therefore patient did not require any adjuvant chemotherapy.  Patient completed radiation treatment and started Letrozole  in August 2022    Interval history- Patient returns to clinic for continued surveillance of breast cancer. Tolerating letrozole  well without significant side effects. Tolerating well without significant side effects. Denies breast complaints. Appetite and weight are stable. Continues calcium  and vitamin d . No new bone pain.   ECOG PS- 0 Pain scale- 0  Review of systems- Review of Systems  Constitutional:  Negative for chills, fever, malaise/fatigue and weight loss.  HENT:  Negative for congestion, ear discharge and nosebleeds.   Eyes:  Negative for blurred vision.  Respiratory:  Negative for cough, hemoptysis, sputum production, shortness of breath and wheezing.   Cardiovascular:  Negative for chest pain, palpitations, orthopnea and claudication.  Gastrointestinal:  Negative for abdominal pain, blood in stool, constipation, diarrhea, heartburn, melena, nausea and vomiting.  Genitourinary:  Negative for dysuria, flank pain, frequency, hematuria and urgency.  Musculoskeletal:  Negative for back pain, joint pain and myalgias.  Skin:  Negative for rash.  Neurological:  Negative for dizziness, tingling, focal weakness, seizures, weakness and headaches.  Endo/Heme/Allergies:  Does not bruise/bleed easily.  Psychiatric/Behavioral:  Negative for depression and suicidal ideas. The patient does not have insomnia.       No Known Allergies   Past Medical History:  Diagnosis Date   Allergy    Anemia    Anxiety    Asthma    Blood transfusion  without reported diagnosis    Breast cancer (HCC)    Cataract    Chicken pox    Colon polyps    Depression    Heart murmur    Hypertension    Neuromuscular disorder (HCC)    neuropathy    Personal history of radiation therapy    Pneumonia    Stomach ulcer      Past Surgical History:  Procedure Laterality Date   BREAST BIOPSY Right 01/01/2021   u/s bx 12:00 1 cmfn positive x clip lateral   BREAST BIOPSY Right 01/01/2021   u/s bx 12:00 1cmfn positive vision medial   BREAST LUMPECTOMY     BREAST LUMPECTOMY WITH SENTINEL LYMPH NODE BIOPSY Right 01/18/2021   Procedure: BREAST LUMPECTOMY WITH SENTINEL LYMPH NODE BX;  Surgeon: Dessa Reyes ORN, MD;  Location: [facility] ORS;  Service: General;  Laterality: Right;   CHOLECYSTECTOMY     CHOLECYSTECTOMY, LAPAROSCOPIC  03/1996   COLONOSCOPY     COLONOSCOPY N/A 11/20/2023   Procedure: COLONOSCOPY;  Surgeon: Onita Elspeth Sharper, DO;  Location: [facility] ENDOSCOPY;  Service: Gastroenterology;  Laterality: N/A;   ESOPHAGOGASTRODUODENOSCOPY N/A 11/20/2023   Procedure: EGD (ESOPHAGOGASTRODUODENOSCOPY);  Surgeon: Onita Elspeth Sharper, DO;  Location: [facility] ENDOSCOPY;  Service: Gastroenterology;  Laterality: N/A;   ESOPHAGOGASTRODUODENOSCOPY (EGD) WITH PROPOFOL  N/A 12/31/2018   Procedure: ESOPHAGOGASTRODUODENOSCOPY (EGD) WITH PROPOFOL ;  Surgeon: Jinny Carmine, MD;  Location: [facility] ENDOSCOPY;  Service: Endoscopy;  Laterality: N/A;   TUBAL LIGATION     WISDOM TOOTH EXTRACTION      Social History   Socioeconomic History   Marital status: Married    Spouse name: Darina   Number of children: 2   Years of education: high school   Highest education level: GED or equivalent  Occupational History   Occupation: Golf/clubhouse attendent  Tobacco Use   Smoking status: Former    Current packs/day: 0.00    Types: Cigarettes    Quit date: 08/18/1998    Years since quitting: 25.6   Smokeless tobacco: Never   Tobacco comments:    Former cigarette smoker  Advertising account planner   Vaping status: Some Days   Substances: Nicotine, Flavoring  Substance and Sexual Activity   Alcohol use: Not Currently    Comment: a few times a year   Drug use: Never   Sexual  activity: Yes    Birth control/protection: Post-menopausal, Other-see comments  Other Topics Concern   Not on file  Social History Narrative   02/07/20   From: the area   Living: with husband, Darina (2000)   Work: at a golf course      Family: daughters - CJ (lives at [place], 2 children) and Katelyn (still living at home, in Social worker school)      Enjoys: golf       Exercise: golfing 3-4 times a week - alternating between cart/walking   Diet: pretty good - eggs/fruit, eats lunch and dinner      Safety   Seat belts: Yes    Guns: Yes  and secure   Safe in relationships: Yes    Social Drivers of Corporate investment banker Strain: Low Risk  (02/21/2024)   Overall Financial Resource Strain (CARDIA)    Difficulty of Paying Living Expenses: Not very hard  Food Insecurity: No Food Insecurity (02/21/2024)   Hunger Vital Sign    Worried About Running Out of Food in the Last Year: Never true    Ran Out of Food in the Last Year: Never  true  Transportation Needs: No Transportation Needs (02/21/2024)   PRAPARE - Administrator, Civil Service (Medical): No    Lack of Transportation (Non-Medical): No  Physical Activity: Insufficiently Active (02/21/2024)   Exercise Vital Sign    Days of Exercise per Week: 3 days    Minutes of Exercise per Session: 20 min  Stress: No Stress Concern Present (02/21/2024)   [employer] of Occupational Health - Occupational Stress Questionnaire    Feeling of Stress: Not at all  Social Connections: Moderately Integrated (02/21/2024)   Social Connection and Isolation Panel    Frequency of Communication with Friends and Family: Three times a week    Frequency of Social Gatherings with Friends and Family: Once a week    Attends Religious Services: Patient declined    Database administrator or Organizations: Yes    Attends Engineer, structural: More than 4 times per year    Marital Status: Married  Catering manager Violence: Unknown (01/21/2019)    Received from [facility]   Humiliation, Afraid, Rape, and Kick questionnaire    Within the last year, have you been afraid of your partner or ex-partner?: Patient declined    Within the last year, have you been humiliated or emotionally abused in other ways by your partner or ex-partner?: Patient declined    Within the last year, have you been kicked, hit, slapped, or otherwise physically hurt by your partner or ex-partner?: Patient declined    Within the last year, have you been raped or forced to have any kind of sexual activity by your partner or ex-partner?: Patient declined    Family History  Problem Relation Age of Onset   Hypertension Mother    Diabetes Mother    Hyperlipidemia Mother    Kidney disease Mother    Arthritis Mother    Asthma Mother    Depression Mother    Sudden death Father        hit by train   Liver disease Father        hx of liver transplant   Early death Father    Hyperlipidemia Sister    Hypertension Sister    Breast cancer Maternal Grandmother 70   Arthritis Maternal Grandmother    Cancer Maternal Grandmother    Diabetes Maternal Grandmother    Hearing loss Maternal Grandmother    Hypertension Maternal Grandmother    Early death Maternal Grandfather    Heart attack Paternal Grandmother        passed from heart attach   Cataracts Paternal Grandfather    Lung cancer Paternal Grandfather    Depression Daughter    Depression Daughter    Bone cancer Maternal Great-grandmother      Current Outpatient Medications:    albuterol  (VENTOLIN  HFA) 108 (90 Base) MCG/ACT inhaler, INHALE 2 PUFFS INTO THE LUNGS EVERY 4 (FOUR) HOURS AS NEEDED FOR SHORTNESS OF BREATH OR WHEEZING, Disp: 8.5 each, Rfl: 1   ALPRAZolam  (XANAX ) 0.5 MG tablet, TAKE 1 TABLET BY MOUTH EVERY DAY AS NEEDED FOR ANXIETY, Disp: 30 tablet, Rfl: 5   budesonide -formoterol  (SYMBICORT ) 80-4.5 MCG/ACT inhaler, Inhale 2 puffs into the lungs 2 (two) times daily. Rinse mouth out after each use,  Disp: 1 each, Rfl: 12   Cholecalciferol (VITAMIN D3) 125 MCG (5000 UT) CAPS, Take 5,000 Units by mouth daily., Disp: , Rfl:    escitalopram  (LEXAPRO ) 20 MG tablet, Take 1 tablet (20 mg total) by mouth daily., Disp: 90 tablet, Rfl:  3   famotidine  (PEPCID ) 20 MG tablet, Take 20 mg by mouth 2 (two) times daily., Disp: , Rfl:    fenofibrate  (TRICOR ) 145 MG tablet, Take 1 tablet (145 mg total) by mouth daily., Disp: 90 tablet, Rfl: 3   letrozole  (FEMARA ) 2.5 MG tablet, TAKE 1 TABLET BY MOUTH EVERY DAY, Disp: 90 tablet, Rfl: 2   montelukast  (SINGULAIR ) 10 MG tablet, TAKE 1 TABLET BY MOUTH EVERYDAY AT BEDTIME, Disp: 90 tablet, Rfl: 3   Potassium Chloride  ER 20 MEQ TBCR, Take 1 tablet (20 mEq total) by mouth daily., Disp: 90 tablet, Rfl: 3   pregabalin  (LYRICA ) 200 MG capsule, Take 200 mg by mouth 2 (two) times daily., Disp: , Rfl:    rosuvastatin  (CRESTOR ) 10 MG tablet, Take 1 tablet (10 mg total) by mouth daily., Disp: 90 tablet, Rfl: 3   tirzepatide  (ZEPBOUND ) 10 MG/0.5ML Pen, Inject 10 mg into the skin once a week., Disp: 6 mL, Rfl: 1  Physical exam:  Vitals:   03/18/24 1309  BP: 120/86  Pulse: 65  Resp: 20  Temp: 98.6 F (37 C)  SpO2: 100%  Weight: 191 lb 1.6 oz (86.7 kg)   Physical Exam Vitals reviewed.  Constitutional:      Appearance: She is not ill-appearing.  Cardiovascular:     Rate and Rhythm: Normal rate and regular rhythm.  Pulmonary:     Effort: Pulmonary effort is normal.     Breath sounds: Normal breath sounds.  Chest:     Comments: Breast exam was performed in seated position. Patient is status post right lumpectomy with a well-healed surgical scar. No evidence of any palpable masses. No evidence of axillary adenopathy. No evidence of any palpable masses or lumps in the left breast. No evidence of leftt axillary adenopathy Skin:    General: Skin is warm and dry.  Neurological:     Mental Status: She is alert and oriented to person, place, and time.  Psychiatric:         Mood and Affect: Mood normal.        Behavior: Behavior normal.       Latest Ref Rng & Units 02/16/2024    7:54 AM  CMP  Glucose 70 - 99 mg/dL 885   BUN 6 - 23 mg/dL 15   Creatinine 9.59 - 1.20 mg/dL 8.83   Sodium 864 - 854 mEq/L 138   Potassium 3.5 - 5.1 mEq/L 4.8   Chloride 96 - 112 mEq/L 102   CO2 19 - 32 mEq/L 27   Calcium  8.4 - 10.5 mg/dL 9.7   Total Protein 6.0 - 8.3 g/dL 7.3   Total Bilirubin 0.2 - 1.2 mg/dL 0.5   Alkaline Phos 39 - 117 U/L 33   AST 0 - 37 U/L 19   ALT 0 - 35 U/L 18       Latest Ref Rng & Units 02/16/2024    7:54 AM  CBC  WBC 4.0 - 10.5 K/uL 5.8   Hemoglobin 12.0 - 15.0 g/dL 86.3   Hematocrit 63.9 - 46.0 % 41.2   Platelets 150.0 - 400.0 K/uL 164.0    12/29/23- MM 3D Diagnostic Mammogram Bilateral CLINICAL DATA:  Patient with multifocal invasive mammary carcinoma status post lumpectomy in June 2022. Patient is status post radiation. Currently taking letrozole .   EXAM: DIGITAL DIAGNOSTIC BILATERAL MAMMOGRAM WITH TOMOSYNTHESIS AND CAD   TECHNIQUE: Bilateral digital diagnostic mammography and breast tomosynthesis was performed. The images were evaluated with computer-aided detection.   COMPARISON:  Previous exam(s).   ACR Breast Density Category b: There are scattered areas of fibroglandular density.   FINDINGS: There is density and architectural distortion within the RIGHT breast, consistent with postsurgical changes. These are stable in comparison to prior. No suspicious mass, distortion, or microcalcifications are identified to suggest presence of malignancy.   IMPRESSION: No mammographic evidence of malignancy.   RECOMMENDATION: Per protocol, as the patient is now 2 or more years status post lumpectomy, she may return to annual screening mammography in 1 year. However, given the history of breast cancer, the patient remains eligible for annual diagnostic mammography if preferred. (Code:[id number]-B-01Y)   I have discussed the findings and  recommendations with the patient. If applicable, a reminder letter will be sent to the patient regarding the next appointment.   BI-RADS CATEGORY  2: Benign.     Electronically Signed   By: Corean Salter M.D.   On: 12/29/2023 15:06  Assessment and plan- Patient is a 48 y.o. female who returns to clinic for follow up of:   Right breast cancer- pathological prognostic stage Ia invasive mammary carcinoma of the right breast MPT [id number] ER/PR positive HER2 negative. s/p lumpectomy and adjuvant radiation treatment August 2022. On Letrozole  which she will complete in August 2027, however, given lymph node positive disease, could consider extended therapy. Her last mammogram was May 2025 and reported as bi-rads 2: benign. Clinically no evidence of disease today. Asymptomatic of recurrent disease. Plan to repeat imaging annually. Next due May 2026.  Bone Health- 9/22 baseline bone density T score 0.6 (normal), 9/24- T score 0.4 (normal). Plan to repeat September 2026. Continue calcium  and vitamin d  along with weight bearing exercise as tolerated.   Disposition:  6 mo- see Dr Melanee for breast cancer surveillance  Visit Diagnosis 1. Encounter for follow-up surveillance of breast cancer   2. Encounter for monitoring aromatase inhibitor therapy    Tinnie Dawn, DNP, AGNP-C, [facility] at [facility] [phone] (clinic) 03/18/2024

## 2024-04-06 ENCOUNTER — Other Ambulatory Visit (INDEPENDENT_AMBULATORY_CARE_PROVIDER_SITE_OTHER)

## 2024-04-06 DIAGNOSIS — E782 Mixed hyperlipidemia: Secondary | ICD-10-CM | POA: Diagnosis not present

## 2024-04-06 DIAGNOSIS — E538 Deficiency of other specified B group vitamins: Secondary | ICD-10-CM

## 2024-04-06 LAB — COMPREHENSIVE METABOLIC PANEL WITH GFR
ALT: 21 U/L (ref 0–35)
AST: 23 U/L (ref 0–37)
Albumin: 4.7 g/dL (ref 3.5–5.2)
Alkaline Phosphatase: 27 U/L — ABNORMAL LOW (ref 39–117)
BUN: 15 mg/dL (ref 6–23)
CO2: 27 meq/L (ref 19–32)
Calcium: 9.3 mg/dL (ref 8.4–10.5)
Chloride: 105 meq/L (ref 96–112)
Creatinine, Ser: 1.08 mg/dL (ref 0.40–1.20)
GFR: 60.24 mL/min (ref >60.00)
Glucose, Bld: 102 mg/dL — ABNORMAL HIGH (ref 70–99)
Potassium: 4.8 meq/L (ref 3.5–5.1)
Sodium: 140 meq/L (ref 135–145)
Total Bilirubin: 0.5 mg/dL (ref 0.2–1.2)
Total Protein: 7.4 g/dL (ref 6.0–8.3)

## 2024-04-06 LAB — VITAMIN B12: Vitamin B-12: 528 pg/mL (ref 211–911)

## 2024-04-06 LAB — LIPID PANEL
Cholesterol: 208 mg/dL — ABNORMAL HIGH (ref 0–200)
HDL: 41.2 mg/dL (ref >39.00)
LDL Cholesterol: 130 mg/dL — ABNORMAL HIGH (ref 0–99)
NonHDL: 167.2
Total CHOL/HDL Ratio: 5
Triglycerides: 188 mg/dL — ABNORMAL HIGH (ref 0.0–149.0)
VLDL: 37.6 mg/dL (ref 0.0–40.0)

## 2024-04-28 DIAGNOSIS — G608 Other hereditary and idiopathic neuropathies: Secondary | ICD-10-CM | POA: Diagnosis not present

## 2024-05-16 ENCOUNTER — Other Ambulatory Visit (HOSPITAL_COMMUNITY): Payer: Self-pay

## 2024-05-27 DIAGNOSIS — D1724 Benign lipomatous neoplasm of skin and subcutaneous tissue of left leg: Secondary | ICD-10-CM | POA: Diagnosis not present

## 2024-05-27 DIAGNOSIS — B078 Other viral warts: Secondary | ICD-10-CM | POA: Diagnosis not present

## 2024-05-29 ENCOUNTER — Other Ambulatory Visit: Payer: Self-pay | Admitting: Nurse Practitioner

## 2024-05-29 DIAGNOSIS — E669 Obesity, unspecified: Secondary | ICD-10-CM

## 2024-06-08 ENCOUNTER — Other Ambulatory Visit: Payer: Self-pay | Admitting: Medical Genetics

## 2024-06-08 DIAGNOSIS — Z006 Encounter for examination for normal comparison and control in clinical research program: Secondary | ICD-10-CM

## 2024-07-05 ENCOUNTER — Other Ambulatory Visit (HOSPITAL_COMMUNITY): Payer: Self-pay

## 2024-07-05 ENCOUNTER — Other Ambulatory Visit: Payer: Self-pay | Admitting: Nurse Practitioner

## 2024-07-05 ENCOUNTER — Telehealth: Payer: Self-pay

## 2024-07-05 DIAGNOSIS — E669 Obesity, unspecified: Secondary | ICD-10-CM

## 2024-07-05 MED ORDER — ZEPBOUND 12.5 MG/0.5ML ~~LOC~~ SOAJ: 12.5000 mg | SUBCUTANEOUS | 2 refills | Status: DC

## 2024-07-05 NOTE — Addendum Note (Signed)
 Addended by: GRETEL APP on: 07/05/2024 01:19 PM   Modules accepted: Orders

## 2024-07-05 NOTE — Telephone Encounter (Signed)
 Pharmacy Patient Advocate Encounter   Received notification from Onbase that prior authorization for Zepbound  10MG /0.5ML pen-injectors  is required/requested.   Insurance verification completed.   The patient is insured through [employer].   Per test claim: PA required; PA submitted to above mentioned insurance via Latent Key/confirmation #/EOC [facility] Status is pending

## 2024-07-05 NOTE — Addendum Note (Signed)
 Addended by: ORLANDO KINGDOM on: 07/05/2024 11:22 AM   Modules accepted: Orders

## 2024-07-05 NOTE — Telephone Encounter (Signed)
 Copied from CRM [id number]. Topic: Clinical - Prescription Issue >> Jul 05, 2024  8:56 AM Rea BROCKS wrote: Reason for CRM: tirzepatide  (ZEPBOUND ) 10 MG/0.5ML Pen   [id number] [place], [place] - [address] AT [facility] [address] [place] [address] Phone: [phone] Fax: [phone] Hours: Not open 24 hours     Patient went to pharmacy to pick up medication and they told her that she needs prescribers approval and she wants to increase dosage as well. If possible,  [phone] (M).

## 2024-07-07 NOTE — Telephone Encounter (Signed)
 Pharmacy Patient Advocate Encounter  Received notification from [facility] that Prior Authorization for Zepbound  10MG /0.5ML pen-injectors  has been APPROVED from 07/06/2024 to 07/06/2025   PA #/Case ID/Reference #: [id number]

## 2024-07-15 LAB — GENECONNECT MOLECULAR SCREEN

## 2024-07-19 ENCOUNTER — Telehealth: Payer: Self-pay | Admitting: Medical Genetics

## 2024-07-19 DIAGNOSIS — Z006 Encounter for examination for normal comparison and control in clinical research program: Secondary | ICD-10-CM

## 2024-07-21 NOTE — Telephone Encounter (Signed)
 [place] [facility]  07/21/2024  11:43 AM  Confirmed I was speaking with Morgan Sanders [id number] by using name and DOB. Informed participant the reason for this call is to follow-up on a recent sample the participant provided at one of the [facility] lab locations. Informed participant the test was not able to be completed with this sample and apologized for the inconvenience. Participant was requested to provide a new sample at one of our participating labs at no cost so that participant can continue participation and receive test results. Informed participant they do not need to be fasting and if there are other samples that need to be drawn, they can be done at the same visit. Participant has not had a blood transfusion or blood product in the last 30 days. Participant agreed to provide another sample. Participant was provided the [employer] program website to learn why this may have happened. Participant was thanked for their time and continued support of the above study.    [name], BS [name]  [facility] Clinical Research Specialist II Direct Dial: [phone]  Fax: [phone]

## 2024-07-26 ENCOUNTER — Other Ambulatory Visit: Payer: Self-pay | Admitting: Nurse Practitioner

## 2024-07-26 DIAGNOSIS — G629 Polyneuropathy, unspecified: Secondary | ICD-10-CM

## 2024-07-27 ENCOUNTER — Other Ambulatory Visit: Payer: Self-pay | Admitting: Nurse Practitioner

## 2024-07-27 ENCOUNTER — Other Ambulatory Visit: Payer: Self-pay | Admitting: Oncology

## 2024-07-27 DIAGNOSIS — Z79811 Long term (current) use of aromatase inhibitors: Secondary | ICD-10-CM

## 2024-07-27 DIAGNOSIS — E782 Mixed hyperlipidemia: Secondary | ICD-10-CM

## 2024-08-05 ENCOUNTER — Other Ambulatory Visit
Admission: RE | Admit: 2024-08-05 | Discharge: 2024-08-05 | Disposition: A | Discharge status: Home / Self Care | Payer: Self-pay | Source: Ambulatory Visit | Attending: Medical Genetics | Admitting: Medical Genetics

## 2024-08-05 DIAGNOSIS — Z006 Encounter for examination for normal comparison and control in clinical research program: Secondary | ICD-10-CM | POA: Insufficient documentation

## 2024-08-23 LAB — GENECONNECT MOLECULAR SCREEN: Genetic Analysis Overall Interpretation: NEGATIVE

## 2024-08-24 ENCOUNTER — Ambulatory Visit: Admitting: Nurse Practitioner

## 2024-08-24 ENCOUNTER — Ambulatory Visit: Payer: Self-pay | Admitting: Nurse Practitioner

## 2024-08-24 ENCOUNTER — Encounter: Payer: Self-pay | Admitting: Nurse Practitioner

## 2024-08-24 VITALS — BP 120/78 | HR 78 | Temp 98.0°F | Ht 65.5 in | Wt 187.6 lb

## 2024-08-24 DIAGNOSIS — E669 Obesity, unspecified: Secondary | ICD-10-CM | POA: Diagnosis not present

## 2024-08-24 DIAGNOSIS — G629 Polyneuropathy, unspecified: Secondary | ICD-10-CM | POA: Diagnosis not present

## 2024-08-24 DIAGNOSIS — E782 Mixed hyperlipidemia: Secondary | ICD-10-CM

## 2024-08-24 DIAGNOSIS — J4540 Moderate persistent asthma, uncomplicated: Secondary | ICD-10-CM | POA: Diagnosis not present

## 2024-08-24 DIAGNOSIS — F411 Generalized anxiety disorder: Secondary | ICD-10-CM | POA: Diagnosis not present

## 2024-08-24 LAB — LIPID PANEL
Cholesterol: 224 mg/dL — ABNORMAL HIGH (ref 28–200)
HDL: 47.5 mg/dL
LDL Cholesterol: 149 mg/dL — ABNORMAL HIGH (ref 10–99)
NonHDL: 176.56
Total CHOL/HDL Ratio: 5
Triglycerides: 136 mg/dL (ref 10.0–149.0)
VLDL: 27.2 mg/dL (ref 0.0–40.0)

## 2024-08-24 LAB — COMPREHENSIVE METABOLIC PANEL WITH GFR
ALT: 18 U/L (ref 3–35)
AST: 22 U/L (ref 5–37)
Albumin: 4.7 g/dL (ref 3.5–5.2)
Alkaline Phosphatase: 38 U/L — ABNORMAL LOW (ref 39–117)
BUN: 12 mg/dL (ref 6–23)
CO2: 26 meq/L (ref 19–32)
Calcium: 9.3 mg/dL (ref 8.4–10.5)
Chloride: 106 meq/L (ref 96–112)
Creatinine, Ser: 0.93 mg/dL (ref 0.40–1.20)
GFR: 71.88 mL/min
Glucose, Bld: 92 mg/dL (ref 70–99)
Potassium: 4.4 meq/L (ref 3.5–5.1)
Sodium: 138 meq/L (ref 135–145)
Total Bilirubin: 0.5 mg/dL (ref 0.2–1.2)
Total Protein: 7.1 g/dL (ref 6.0–8.3)

## 2024-08-24 MED ORDER — ALPRAZOLAM 0.5 MG PO TABS: ORAL_TABLET | ORAL | 5 refills | Status: AC

## 2024-08-24 NOTE — Assessment & Plan Note (Signed)
 Managed with Zepbound  12.5 mg weekly. No medication side effects. Improved gastrointestinal symptoms likely due to diet and exercise. Continue regular exercise, including yoga and golf. Maintain a healthy diet with adequate protein and fruits.

## 2024-08-24 NOTE — Progress Notes (Signed)
 " Morgan Glance, NP-C Phone: [phone]  Morgan Sanders is a 49 y.o. female who presents today for follow up.   Discussed the use of AI scribe software for clinical note transcription with the patient, who gave verbal consent to proceed.  History of Present Illness   Morgan Sanders is a 49 year old female who presents for a six-month follow-up.  Her anxiety is well-managed with Lexapro  and Xanax , which she takes as needed and infrequently. She requests refills for these medications.  Her hyperlipidemia is managed with Crestor  and fenofibrate . She takes Crestor  three days a week without side effects. Her most recent cholesterol labs showed a decrease in LDL from 170 to 130. She is also on Zepbound , which helps control her appetite, and she has lost a couple of pounds, maintaining a stable weight. Exercise remains a challenge, but she tries to incorporate it as much as possible.  She experienced a recent exacerbation of asthma symptoms following a cold last week. On Sunday, her symptoms worsened, requiring frequent use of Symbicort  every four hours to manage her breathing. She continues to have a lingering cough but notes improvement in her symptoms.  She is currently taking potassium supplements. Her most recent labs showed improved kidney function and B12 levels compared to the last visit.      Tobacco Use History[1]  Medications Ordered Prior to Encounter[2]   ROS see history of present illness  Objective  Physical Exam Vitals:   08/24/24 0958  BP: 120/78  Pulse: 78  Temp: 98 F (36.7 C)  SpO2: 99%    BP Readings from Last 3 Encounters:  08/24/24 120/78  03/18/24 120/86  02/22/24 124/78   Wt Readings from Last 3 Encounters:  08/24/24 187 lb 9.6 oz (85.1 kg)  03/18/24 191 lb 1.6 oz (86.7 kg)  02/22/24 188 lb 3.2 oz (85.4 kg)    Physical Exam Constitutional:      General: She is not in acute distress.    Appearance: Normal appearance.  HENT:     Head:  Normocephalic.  Cardiovascular:     Rate and Rhythm: Normal rate and regular rhythm.     Heart sounds: Normal heart sounds.  Pulmonary:     Effort: Pulmonary effort is normal.     Breath sounds: Normal breath sounds.  Skin:    General: Skin is warm and dry.  Neurological:     General: No focal deficit present.     Mental Status: She is alert.  Psychiatric:        Mood and Affect: Mood normal.        Behavior: Behavior normal.      Assessment/Plan: Please see individual problem list.  Mixed hyperlipidemia Assessment & Plan: Chronic hyperlipidemia shows improved cholesterol levels with LDL reduced to 130. Managed with Crestor  on Monday, Wednesday, Friday. Check fasting lipid panel today. Encourage healthy diet and regular exercise. Consider increasing to daily if LDL levels remain elevated and patient able to tolerate. We will continue to monitor.   Orders: -     Comprehensive metabolic panel with GFR -     Lipid panel  Generalized anxiety disorder Assessment & Plan: Well controlled on Xanax  0.5 mg daily as needed and Lexapro  20 mg daily. Continue. PDMP reviewed. Refills sent.   Orders: -     ALPRAZolam ; TAKE 1 TABLET BY MOUTH EVERY DAY AS NEEDED FOR ANXIETY  Dispense: 30 tablet; Refill: 5  Obesity (BMI 30-39.9) Assessment & Plan: Managed with Zepbound  12.5  mg weekly. No medication side effects. Improved gastrointestinal symptoms likely due to diet and exercise. Continue regular exercise, including yoga and golf. Maintain a healthy diet with adequate protein and fruits.    Moderate persistent asthma without complication Assessment & Plan: Exacerbated by recent cold, symptoms improving with Symbicort . Continue current asthma management with Symbicort , Singulair  and Albuterol . Return precautions given to patient.    Polyneuropathy Assessment & Plan: They are followed by neurology. We will continue their current regimen and follow up with neurology as scheduled. Check  CMP.  Orders: -     Comprehensive metabolic panel with GFR      Return in about 6 months (around 02/21/2025) for Follow up.   Morgan Glance, NP-C [facility]    [1]  Social History Tobacco Use  Smoking Status Former   Current packs/day: 0.00   Types: Cigarettes   Quit date: 08/18/1998   Years since quitting: 26.0  Smokeless Tobacco Never  Tobacco Comments   Former cigarette smoker  [2]  Current Outpatient Medications on File Prior to Visit  Medication Sig Dispense Refill   albuterol  (VENTOLIN  HFA) 108 (90 Base) MCG/ACT inhaler INHALE 2 PUFFS INTO THE LUNGS EVERY 4 (FOUR) HOURS AS NEEDED FOR SHORTNESS OF BREATH OR WHEEZING 8.5 each 1   budesonide -formoterol  (SYMBICORT ) 80-4.5 MCG/ACT inhaler Inhale 2 puffs into the lungs 2 (two) times daily. Rinse mouth out after each use 1 each 12   Cholecalciferol (VITAMIN D3) 125 MCG (5000 UT) CAPS Take 5,000 Units by mouth daily.     escitalopram  (LEXAPRO ) 20 MG tablet Take 1 tablet (20 mg total) by mouth daily. 90 tablet 3   famotidine  (PEPCID ) 20 MG tablet Take 20 mg by mouth 2 (two) times daily.     fenofibrate  (TRICOR ) 145 MG tablet Take 1 tablet (145 mg total) by mouth daily. 90 tablet 3   letrozole  (FEMARA ) 2.5 MG tablet TAKE 1 TABLET BY MOUTH EVERY DAY 30 tablet 8   montelukast  (SINGULAIR ) 10 MG tablet TAKE 1 TABLET BY MOUTH EVERYDAY AT BEDTIME 90 tablet 3   Potassium Chloride  ER 20 MEQ TBCR TAKE 1 TABLET BY MOUTH EVERY DAY 90 tablet 4   pregabalin  (LYRICA ) 200 MG capsule Take 200 mg by mouth 2 (two) times daily.     rosuvastatin  (CRESTOR ) 10 MG tablet TAKE 1 TABLET BY MOUTH EVERY DAY 90 tablet 3   tirzepatide  (ZEPBOUND ) 12.5 MG/0.5ML Pen Inject 12.5 mg into the skin once a week. 2 mL 2   No current facility-administered medications on file prior to visit.   "

## 2024-08-24 NOTE — Assessment & Plan Note (Addendum)
 They are followed by neurology. We will continue their current regimen and follow up with neurology as scheduled. Check CMP.

## 2024-08-24 NOTE — Assessment & Plan Note (Signed)
 Well controlled on Xanax  0.5 mg daily as needed and Lexapro  20 mg daily. Continue. PDMP reviewed. Refills sent.

## 2024-08-24 NOTE — Assessment & Plan Note (Signed)
 Chronic hyperlipidemia shows improved cholesterol levels with LDL reduced to 130. Managed with Crestor  on Monday, Wednesday, Friday. Check fasting lipid panel today. Encourage healthy diet and regular exercise. Consider increasing to daily if LDL levels remain elevated and patient able to tolerate. We will continue to monitor.

## 2024-08-24 NOTE — Assessment & Plan Note (Addendum)
 Exacerbated by recent cold, symptoms improving with Symbicort . Continue current asthma management with Symbicort , Singulair  and Albuterol . Return precautions given to patient.

## 2024-09-18 ENCOUNTER — Telehealth: Payer: Self-pay | Admitting: Oncology

## 2024-09-18 NOTE — Telephone Encounter (Signed)
 Called pt to inform them that we will be closed tomorrow due to snow and need to reschedule her appt. We got her rescheduled to Thursday 2/5 and 1045. She agreed that this would work for her and confirmed that she would be there

## 2024-09-19 ENCOUNTER — Inpatient Hospital Stay: Admitting: Oncology

## 2024-09-20 ENCOUNTER — Other Ambulatory Visit: Payer: Self-pay | Admitting: Nurse Practitioner

## 2024-09-20 DIAGNOSIS — E669 Obesity, unspecified: Secondary | ICD-10-CM

## 2024-09-22 ENCOUNTER — Inpatient Hospital Stay: Admitting: Oncology

## 2024-09-22 ENCOUNTER — Other Ambulatory Visit: Payer: Self-pay | Admitting: Oncology

## 2024-09-22 ENCOUNTER — Encounter: Payer: Self-pay | Admitting: Oncology

## 2024-09-22 VITALS — BP 107/88 | HR 87 | Temp 98.8°F | Resp 18 | Ht 65.5 in | Wt 186.3 lb

## 2024-09-22 DIAGNOSIS — Z170 Estrogen receptor positive status [ER+]: Secondary | ICD-10-CM

## 2024-09-22 DIAGNOSIS — Z08 Encounter for follow-up examination after completed treatment for malignant neoplasm: Secondary | ICD-10-CM

## 2024-09-22 DIAGNOSIS — Z79811 Long term (current) use of aromatase inhibitors: Secondary | ICD-10-CM

## 2024-09-22 DIAGNOSIS — Z79899 Other long term (current) drug therapy: Secondary | ICD-10-CM

## 2024-09-22 DIAGNOSIS — C50911 Malignant neoplasm of unspecified site of right female breast: Secondary | ICD-10-CM

## 2024-09-22 DIAGNOSIS — Z853 Personal history of malignant neoplasm of breast: Secondary | ICD-10-CM

## 2024-09-22 NOTE — Progress Notes (Signed)
 "    Hematology/Oncology Consult note [facility]  Telephone:([phone] [phone]  Patient Care Team: Gretel App, NP as PCP - General (Nurse Practitioner) Darliss Rogue, MD as PCP - Cardiology (Cardiology) Samuella Twyla CHRISTELLA DEVONNA (Gastroenterology) Avanell Katz, MD as Referring Physician (Physical Medicine and Rehabilitation) Fleeta Shiver, Herlene BIRCH, MD (Inactive) (Neurology) Dannielle Arlean FALCON, RN (Inactive) as Oncology Nurse Navigator Melanee Annah BROCKS, MD as Consulting Physician (Oncology)   Name of the patient: Morgan Sanders  [id number]  1975-11-06   Date of visit: 09/22/24  Diagnosis-  pathological prognostic stage Ia invasive mammary carcinoma of the right breast and pT1b pN1 acM0 ER/PR positive HER2 negative   Chief complaint/ Reason for visit-routine follow-up of breast cancer  Heme/Onc history: Patient is a 49 year old post menopausal female with a prior history of normocytic anemia for which she had a complete work-up including bone marrow biopsy as well as history of cirrhosis of the liver.  She is postmenopausal and she has not had any menstrual cycles since the age of 49.  Patient underwent a bilateral screening mammogram on 12/21/2020 which showed possible mass in the right breast.  This was followed by diagnostic mammogram and ultrasound which showed 2 masses in the right breast at 12 o'clock position each measuring 6 mm and 1.2 cm apart.  Ultrasound of the right axilla was negative for malignancy.  Patient underwent biopsy of both the medial and the lateral breast mass and it was consistent with invasive mammary carcinoma grade 2.  ER greater than 90% positive, PR greater than 90% positive and HER2 negative   Patient underwent right lumpectomy with sentinel lymph node biopsy Final pathology showed 2 tumors 7 mm in size both of which were grade 1.  The tissue in between did not have any evidence of malignancy or DCIS.  She was noted to have  metastatic carcinoma in 2 out of 4 sentinel lymph node 6 mm deposit with no extracapsular extension.   MammaPrint score came back at low risk and therefore patient did not require any adjuvant chemotherapy.  Patient completed radiation treatment and started Letrozole  in August 2022    Interval history- Morgan Sanders is a 49 year old female with Stage I estrogen receptor positive right breast cancer on adjuvant letrozole  who presents for routine oncology follow-up.  She is taking letrozole  daily without difficulty and has not experienced new breast symptoms. She denies breast pain, soreness, palpable masses, or changes in her breasts. She also denies new musculoskeletal pain.  Her appetite and weight are stable. She continues vitamin D  supplementation but has not initiated calcium  supplementation.  She has no new concerns.       ECOG PS- 0 Pain scale- 0   Review of systems- Review of Systems  Constitutional:  Negative for chills, fever, malaise/fatigue and weight loss.  HENT:  Negative for congestion, ear discharge and nosebleeds.   Eyes:  Negative for blurred vision.  Respiratory:  Negative for cough, hemoptysis, sputum production, shortness of breath and wheezing.   Cardiovascular:  Negative for chest pain, palpitations, orthopnea and claudication.  Gastrointestinal:  Negative for abdominal pain, blood in stool, constipation, diarrhea, heartburn, melena, nausea and vomiting.  Genitourinary:  Negative for dysuria, flank pain, frequency, hematuria and urgency.  Musculoskeletal:  Negative for back pain, joint pain and myalgias.  Skin:  Negative for rash.  Neurological:  Negative for dizziness, tingling, focal weakness, seizures, weakness and headaches.  Endo/Heme/Allergies:  Does not bruise/bleed easily.  Psychiatric/Behavioral:  Negative  for depression and suicidal ideas. The patient does not have insomnia.       Allergies[1]   Past Medical History:  Diagnosis Date    Allergy    Anemia    Anxiety    Asthma    Blood transfusion without reported diagnosis    Breast cancer (HCC)    Cataract    Chicken pox    Colon polyps    Depression    Heart murmur    Hypertension    Neuromuscular disorder (HCC)    neuropathy   Personal history of radiation therapy    Pneumonia    Stomach ulcer      Past Surgical History:  Procedure Laterality Date   BREAST BIOPSY Right 01/01/2021   u/s bx 12:00 1 cmfn positive x clip lateral   BREAST BIOPSY Right 01/01/2021   u/s bx 12:00 1cmfn positive vision medial   BREAST LUMPECTOMY     BREAST LUMPECTOMY WITH SENTINEL LYMPH NODE BIOPSY Right 01/18/2021   Procedure: BREAST LUMPECTOMY WITH SENTINEL LYMPH NODE BX;  Surgeon: Dessa Reyes ORN, MD;  Location: [facility] ORS;  Service: General;  Laterality: Right;   CHOLECYSTECTOMY     CHOLECYSTECTOMY, LAPAROSCOPIC  03/1996   COLONOSCOPY     COLONOSCOPY N/A 11/20/2023   Procedure: COLONOSCOPY;  Surgeon: Onita Elspeth Sharper, DO;  Location: [facility] ENDOSCOPY;  Service: Gastroenterology;  Laterality: N/A;   ESOPHAGOGASTRODUODENOSCOPY N/A 11/20/2023   Procedure: EGD (ESOPHAGOGASTRODUODENOSCOPY);  Surgeon: Onita Elspeth Sharper, DO;  Location: [facility] ENDOSCOPY;  Service: Gastroenterology;  Laterality: N/A;   ESOPHAGOGASTRODUODENOSCOPY (EGD) WITH PROPOFOL  N/A 12/31/2018   Procedure: ESOPHAGOGASTRODUODENOSCOPY (EGD) WITH PROPOFOL ;  Surgeon: Jinny Carmine, MD;  Location: [facility] ENDOSCOPY;  Service: Endoscopy;  Laterality: N/A;   TUBAL LIGATION     WISDOM TOOTH EXTRACTION      Social History   Socioeconomic History   Marital status: Married    Spouse name: Darina   Number of children: 2   Years of education: high school   Highest education level: GED or equivalent  Occupational History   Occupation: Golf/clubhouse attendent  Tobacco Use   Smoking status: Former    Current packs/day: 0.00    Types: Cigarettes    Quit date: 08/18/1998    Years since quitting: 26.1   Smokeless tobacco:  Never   Tobacco comments:    Former cigarette smoker  Advertising Account Planner   Vaping status: Some Days   Substances: Nicotine, Flavoring  Substance and Sexual Activity   Alcohol use: Not Currently    Comment: a few times a year   Drug use: Never   Sexual activity: Yes    Birth control/protection: Post-menopausal, Other-see comments  Other Topics Concern   Not on file  Social History Narrative   02/07/20   From: the area   Living: with husband, Darina (2000)   Work: at a golf course      Family: daughters - CJ (lives at [place], 2 children) and Katelyn (still living at home, in social worker school)      Enjoys: golf       Exercise: golfing 3-4 times a week - alternating between cart/walking   Diet: pretty good - eggs/fruit, eats lunch and dinner      Safety   Seat belts: Yes    Guns: Yes  and secure   Safe in relationships: Yes    Social Drivers of Health   Tobacco Use: Medium Risk (09/22/2024)   Patient History    Smoking Tobacco Use: Former  Smokeless Tobacco Use: Never    Passive Exposure: Not on file  Financial Resource Strain: Low Risk (08/23/2024)   Overall Financial Resource Strain (CARDIA)    Difficulty of Paying Living Expenses: Not hard at all  Food Insecurity: No Food Insecurity (08/23/2024)   Epic    Worried About Programme Researcher, Broadcasting/film/video in the Last Year: Never true    Ran Out of Food in the Last Year: Never true  Transportation Needs: No Transportation Needs (08/23/2024)   Epic    Lack of Transportation (Medical): No    Lack of Transportation (Non-Medical): No  Physical Activity: Sufficiently Active (08/23/2024)   Exercise Vital Sign    Days of Exercise per Week: 5 days    Minutes of Exercise per Session: 30 min  Stress: No Stress Concern Present (08/23/2024)   [employer] of Occupational Health - Occupational Stress Questionnaire    Feeling of Stress: Not at all  Social Connections: Moderately Integrated (08/23/2024)   Social Connection and Isolation Panel    Frequency of  Communication with Friends and Family: More than three times a week    Frequency of Social Gatherings with Friends and Family: Once a week    Attends Religious Services: Patient declined    Database Administrator or Organizations: Yes    Attends Engineer, Structural: More than 4 times per year    Marital Status: Married  Catering Manager Violence: Not on file  Depression (PHQ2-9): Low Risk (09/22/2024)   Depression (PHQ2-9)    PHQ-2 Score: 0  Alcohol Screen: Not on file  Housing: Low Risk (08/23/2024)   Epic    Unable to Pay for Housing in the Last Year: No    Number of Times Moved in the Last Year: 0    Homeless in the Last Year: No  Utilities: Not on file  Health Literacy: Not on file    Family History  Problem Relation Age of Onset   Hypertension Mother    Diabetes Mother    Hyperlipidemia Mother    Kidney disease Mother    Arthritis Mother    Asthma Mother    Depression Mother    Sudden death Father        hit by train   Liver disease Father        hx of liver transplant   Early death Father    Hyperlipidemia Sister    Hypertension Sister    Breast cancer Maternal Grandmother 69   Arthritis Maternal Grandmother    Cancer Maternal Grandmother    Diabetes Maternal Grandmother    Hearing loss Maternal Grandmother    Hypertension Maternal Grandmother    Early death Maternal Grandfather    Heart attack Paternal Grandmother        passed from heart attach   Cataracts Paternal Grandfather    Lung cancer Paternal Grandfather    Depression Daughter    Depression Daughter    Bone cancer Maternal Great-grandmother     Current Medications[2]  Physical exam:  Vitals:   09/22/24 1047  BP: 107/88  Pulse: 87  Resp: 18  Temp: 98.8 F (37.1 C)  TempSrc: Tympanic  SpO2: 99%  Weight: 186 lb 4.8 oz (84.5 kg)  Height: 5' 5.5 (1.664 m)   Physical Exam Cardiovascular:     Rate and Rhythm: Normal rate and regular rhythm.     Heart sounds: Normal heart sounds.   Pulmonary:     Effort: Pulmonary effort is normal.  Breath sounds: Normal breath sounds.  Skin:    General: Skin is warm and dry.  Neurological:     Mental Status: She is alert and oriented to person, place, and time.    Breast exam was performed in seated and lying down position. Patient is status post right lumpectomy with a well-healed surgical scar. No evidence of any palpable masses. No evidence of axillary adenopathy. No evidence of any palpable masses or lumps in the left breast. No evidence of leftt axillary adenopathy   I have personally reviewed labs listed below:    Latest Ref Rng & Units 08/24/2024   10:13 AM  CMP  Glucose 70 - 99 mg/dL 92   BUN 6 - 23 mg/dL 12   Creatinine 9.59 - 1.20 mg/dL 9.06   Sodium 864 - 854 mEq/L 138   Potassium 3.5 - 5.1 mEq/L 4.4   Chloride 96 - 112 mEq/L 106   CO2 19 - 32 mEq/L 26   Calcium  8.4 - 10.5 mg/dL 9.3   Total Protein 6.0 - 8.3 g/dL 7.1   Total Bilirubin 0.2 - 1.2 mg/dL 0.5   Alkaline Phos 39 - 117 U/L 38   AST 5 - 37 U/L 22   ALT 3 - 35 U/L 18       Latest Ref Rng & Units 02/16/2024    7:54 AM  CBC  WBC 4.0 - 10.5 K/uL 5.8   Hemoglobin 12.0 - 15.0 g/dL 86.3   Hematocrit 63.9 - 46.0 % 41.2   Platelets 150.0 - 400.0 K/uL 164.0      Assessment and plan- Patient is a 49 y.o. female with pathological prognostic stage Ia invasive mammary carcinoma of the right breast MPT [id number] ER/PR positive HER2 negative.  She is status postlumpectomy and adjuvant radiation treatment.  She is here for routine follow-up of breast cancer presently on letrozole   Assessment and Plan    Stage I estrogen receptor positive right breast cancer on endocrine therapy - Clinically patient is doing well with no concerning signs and symptoms of recurrence based on today's exam - Confirmed daily letrozole  adherence. - Recommended calcium  supplementation 1200 mg daily. - Confirmed ongoing vitamin D  supplementation. - Scheduled annual screening  mammogram for May. - Scheduled bone density scan for September. - Provided anticipatory guidance on biennial bone health monitoring. - Planned follow-up in six months.         Visit Diagnosis 1. High risk medication use   2. Encounter for follow-up surveillance of breast cancer   3. Malignant neoplasm of right breast, stage 1, estrogen receptor positive (HCC)   4. Use of letrozole  (Femara )      Dr. Annah Skene, MD, MPH [facility] [phone] 09/22/2024 1:17 PM                   [1] No Known Allergies [2]  Current Outpatient Medications:    albuterol  (VENTOLIN  HFA) 108 (90 Base) MCG/ACT inhaler, INHALE 2 PUFFS INTO THE LUNGS EVERY 4 (FOUR) HOURS AS NEEDED FOR SHORTNESS OF BREATH OR WHEEZING, Disp: 8.5 each, Rfl: 1   ALPRAZolam  (XANAX ) 0.5 MG tablet, TAKE 1 TABLET BY MOUTH EVERY DAY AS NEEDED FOR ANXIETY, Disp: 30 tablet, Rfl: 5   budesonide -formoterol  (SYMBICORT ) 80-4.5 MCG/ACT inhaler, Inhale 2 puffs into the lungs 2 (two) times daily. Rinse mouth out after each use, Disp: 1 each, Rfl: 12   Cholecalciferol (VITAMIN D3) 125 MCG (5000 UT) CAPS, Take 5,000 Units by mouth daily., Disp: ,  Rfl:    escitalopram  (LEXAPRO ) 20 MG tablet, Take 1 tablet (20 mg total) by mouth daily., Disp: 90 tablet, Rfl: 3   famotidine  (PEPCID ) 20 MG tablet, Take 20 mg by mouth 2 (two) times daily., Disp: , Rfl:    fenofibrate  (TRICOR ) 145 MG tablet, Take 1 tablet (145 mg total) by mouth daily., Disp: 90 tablet, Rfl: 3   letrozole  (FEMARA ) 2.5 MG tablet, TAKE 1 TABLET BY MOUTH EVERY DAY, Disp: 30 tablet, Rfl: 8   montelukast  (SINGULAIR ) 10 MG tablet, TAKE 1 TABLET BY MOUTH EVERYDAY AT BEDTIME, Disp: 90 tablet, Rfl: 3   Potassium Chloride  ER 20 MEQ TBCR, TAKE 1 TABLET BY MOUTH EVERY DAY, Disp: 90 tablet, Rfl: 4   pregabalin  (LYRICA ) 200 MG capsule, Take 200 mg by mouth 2 (two) times daily., Disp: , Rfl:    rosuvastatin  (CRESTOR ) 10 MG tablet, TAKE 1 TABLET BY MOUTH  EVERY DAY, Disp: 90 tablet, Rfl: 3   ZEPBOUND  12.5 MG/0.5ML Pen, INJECT 12.5 MG SUBCUTANEOUSLY ONE TIME PER WEEK, Disp: 2 mL, Rfl: 2  "

## 2024-09-22 NOTE — Progress Notes (Signed)
 Patient doing good; no new or acute concerns at this time.

## 2024-10-07 ENCOUNTER — Other Ambulatory Visit

## 2024-12-29 ENCOUNTER — Encounter

## 2025-03-22 ENCOUNTER — Inpatient Hospital Stay: Admitting: Oncology
